# Patient Record
Sex: Female | Born: 1960
Health system: Southern US, Community
[De-identification: ages and names within clinical notes are randomized; demographics above are authoritative.]

## PROBLEM LIST (undated history)

## (undated) DIAGNOSIS — D649 Anemia, unspecified: Secondary | ICD-10-CM

## (undated) DIAGNOSIS — M1990 Unspecified osteoarthritis, unspecified site: Secondary | ICD-10-CM

## (undated) DIAGNOSIS — M2550 Pain in unspecified joint: Secondary | ICD-10-CM

## (undated) DIAGNOSIS — Z5189 Encounter for other specified aftercare: Secondary | ICD-10-CM

## (undated) DIAGNOSIS — E785 Hyperlipidemia, unspecified: Secondary | ICD-10-CM

## (undated) DIAGNOSIS — F32A Depression, unspecified: Secondary | ICD-10-CM

## (undated) DIAGNOSIS — K317 Polyp of stomach and duodenum: Secondary | ICD-10-CM

## (undated) DIAGNOSIS — K219 Gastro-esophageal reflux disease without esophagitis: Secondary | ICD-10-CM

## (undated) DIAGNOSIS — N289 Disorder of kidney and ureter, unspecified: Secondary | ICD-10-CM

## (undated) DIAGNOSIS — Z780 Asymptomatic menopausal state: Secondary | ICD-10-CM

## (undated) DIAGNOSIS — F329 Major depressive disorder, single episode, unspecified: Secondary | ICD-10-CM

## (undated) DIAGNOSIS — M8580 Other specified disorders of bone density and structure, unspecified site: Secondary | ICD-10-CM

## (undated) HISTORY — DX: Encounter for other specified aftercare: Z51.89

## (undated) HISTORY — DX: Other specified disorders of bone density and structure, unspecified site: M85.80

## (undated) HISTORY — DX: Anemia, unspecified: D64.9

## (undated) HISTORY — DX: Major depressive disorder, single episode, unspecified: F32.9

## (undated) HISTORY — DX: Polyp of stomach and duodenum: K31.7

## (undated) HISTORY — DX: Gastro-esophageal reflux disease without esophagitis: K21.9

## (undated) HISTORY — DX: Hyperlipidemia, unspecified: E78.5

## (undated) HISTORY — DX: Disorder of kidney and ureter, unspecified: N28.9

## (undated) HISTORY — DX: Asymptomatic menopausal state: Z78.0

## (undated) HISTORY — DX: Pain in unspecified joint: M25.50

## (undated) HISTORY — PX: TUBAL LIGATION: SHX77

## (undated) HISTORY — DX: Depression, unspecified: F32.A

## (undated) HISTORY — DX: Unspecified osteoarthritis, unspecified site: M19.90

---

## 1981-07-04 HISTORY — PX: TUBAL LIGATION: SHX77

## 2003-12-05 ENCOUNTER — Other Ambulatory Visit: Admission: RE | Admit: 2003-12-05 | Discharge: 2003-12-05 | Payer: Self-pay | Admitting: Obstetrics and Gynecology

## 2004-03-02 ENCOUNTER — Ambulatory Visit (HOSPITAL_COMMUNITY): Admission: RE | Admit: 2004-03-02 | Discharge: 2004-03-02 | Payer: Self-pay | Admitting: Obstetrics and Gynecology

## 2004-03-02 ENCOUNTER — Encounter (INDEPENDENT_AMBULATORY_CARE_PROVIDER_SITE_OTHER): Payer: Self-pay | Admitting: Specialist

## 2005-04-19 ENCOUNTER — Other Ambulatory Visit: Admission: RE | Admit: 2005-04-19 | Discharge: 2005-04-19 | Payer: Self-pay | Admitting: Obstetrics and Gynecology

## 2005-07-04 HISTORY — PX: ENDOMETRIAL ABLATION: SHX621

## 2005-10-14 ENCOUNTER — Ambulatory Visit: Payer: Self-pay | Admitting: Family Medicine

## 2005-10-17 ENCOUNTER — Ambulatory Visit: Payer: Self-pay | Admitting: Gastroenterology

## 2007-08-13 ENCOUNTER — Ambulatory Visit: Payer: Self-pay | Admitting: Gastroenterology

## 2007-08-13 LAB — CONVERTED CEMR LAB
AST: 51 units/L — ABNORMAL HIGH (ref 0–37)
Albumin: 4.5 g/dL (ref 3.5–5.2)
Alkaline Phosphatase: 75 units/L (ref 39–117)
BUN: 17 mg/dL (ref 6–23)
Basophils Relative: 0.5 % (ref 0.0–1.0)
CO2: 31 meq/L (ref 19–32)
Calcium: 9.8 mg/dL (ref 8.4–10.5)
Chloride: 103 meq/L (ref 96–112)
Hemoglobin: 12.2 g/dL (ref 12.0–15.0)
Monocytes Absolute: 0.5 10*3/uL (ref 0.2–0.7)
Platelets: 245 10*3/uL (ref 150–400)
Potassium: 5.5 meq/L — ABNORMAL HIGH (ref 3.5–5.1)
RBC: 3.92 M/uL (ref 3.87–5.11)
Sodium: 143 meq/L (ref 135–145)
Total Bilirubin: 0.8 mg/dL (ref 0.3–1.2)
WBC: 7.5 10*3/uL (ref 4.5–10.5)

## 2007-08-24 ENCOUNTER — Encounter: Payer: Self-pay | Admitting: Gastroenterology

## 2007-08-24 ENCOUNTER — Ambulatory Visit: Payer: Self-pay | Admitting: Gastroenterology

## 2007-08-24 DIAGNOSIS — D131 Benign neoplasm of stomach: Secondary | ICD-10-CM | POA: Insufficient documentation

## 2007-08-24 LAB — CONVERTED CEMR LAB
Bilirubin, Direct: 0.1 mg/dL (ref 0.0–0.3)
Total Bilirubin: 1.1 mg/dL (ref 0.3–1.2)
Total Protein: 6.7 g/dL (ref 6.0–8.3)

## 2007-10-01 ENCOUNTER — Ambulatory Visit: Payer: Self-pay | Admitting: Gastroenterology

## 2007-11-10 DIAGNOSIS — F329 Major depressive disorder, single episode, unspecified: Secondary | ICD-10-CM

## 2007-11-10 DIAGNOSIS — K649 Unspecified hemorrhoids: Secondary | ICD-10-CM | POA: Insufficient documentation

## 2007-11-10 DIAGNOSIS — F32A Depression, unspecified: Secondary | ICD-10-CM | POA: Insufficient documentation

## 2008-01-30 ENCOUNTER — Encounter: Admission: RE | Admit: 2008-01-30 | Discharge: 2008-03-25 | Payer: Self-pay | Admitting: Obstetrics and Gynecology

## 2008-07-18 ENCOUNTER — Ambulatory Visit: Payer: Self-pay | Admitting: Oral Surgery

## 2008-11-27 ENCOUNTER — Ambulatory Visit: Payer: Self-pay | Admitting: Family Medicine

## 2008-11-27 DIAGNOSIS — M2550 Pain in unspecified joint: Secondary | ICD-10-CM | POA: Insufficient documentation

## 2008-11-29 ENCOUNTER — Encounter: Payer: Self-pay | Admitting: Family Medicine

## 2008-11-29 LAB — CONVERTED CEMR LAB: Anti Nuclear Antibody(ANA): NEGATIVE

## 2008-12-02 DIAGNOSIS — E785 Hyperlipidemia, unspecified: Secondary | ICD-10-CM | POA: Insufficient documentation

## 2008-12-02 LAB — CONVERTED CEMR LAB
ALT: 15 units/L (ref 0–35)
Albumin: 4.1 g/dL (ref 3.5–5.2)
Alkaline Phosphatase: 64 units/L (ref 39–117)
BUN: 15 mg/dL (ref 6–23)
Chloride: 113 meq/L — ABNORMAL HIGH (ref 96–112)
Cholesterol: 285 mg/dL — ABNORMAL HIGH (ref 0–200)
GFR calc non Af Amer: 94.98 mL/min (ref >60)
HDL: 51.2 mg/dL (ref >39.00)
Potassium: 4.8 meq/L (ref 3.5–5.1)
Rhuematoid fact SerPl-aCnc: 20 intl units/mL (ref 0.0–20.0)
Sodium: 148 meq/L — ABNORMAL HIGH (ref 135–145)
TSH: 2.16 microintl units/mL (ref 0.35–5.50)
VLDL: 21.4 mg/dL (ref 0.0–40.0)

## 2008-12-16 ENCOUNTER — Ambulatory Visit: Payer: Self-pay | Admitting: Family Medicine

## 2008-12-16 ENCOUNTER — Ambulatory Visit: Payer: Self-pay | Admitting: Diagnostic Radiology

## 2008-12-16 ENCOUNTER — Ambulatory Visit (HOSPITAL_BASED_OUTPATIENT_CLINIC_OR_DEPARTMENT_OTHER): Admission: RE | Admit: 2008-12-16 | Discharge: 2008-12-16 | Payer: Self-pay | Admitting: Family Medicine

## 2008-12-16 DIAGNOSIS — E870 Hyperosmolality and hypernatremia: Secondary | ICD-10-CM | POA: Insufficient documentation

## 2008-12-16 DIAGNOSIS — M25519 Pain in unspecified shoulder: Secondary | ICD-10-CM | POA: Insufficient documentation

## 2008-12-16 DIAGNOSIS — M25529 Pain in unspecified elbow: Secondary | ICD-10-CM | POA: Insufficient documentation

## 2008-12-18 LAB — CONVERTED CEMR LAB
CO2: 31 meq/L (ref 19–32)
GFR calc non Af Amer: 94.96 mL/min (ref >60)

## 2009-01-12 ENCOUNTER — Ambulatory Visit: Payer: Self-pay | Admitting: Family Medicine

## 2009-01-12 ENCOUNTER — Telehealth (INDEPENDENT_AMBULATORY_CARE_PROVIDER_SITE_OTHER): Payer: Self-pay | Admitting: *Deleted

## 2009-01-12 DIAGNOSIS — E663 Overweight: Secondary | ICD-10-CM | POA: Insufficient documentation

## 2009-02-03 ENCOUNTER — Ambulatory Visit: Payer: Self-pay | Admitting: Family Medicine

## 2009-02-03 DIAGNOSIS — L255 Unspecified contact dermatitis due to plants, except food: Secondary | ICD-10-CM | POA: Insufficient documentation

## 2009-02-19 ENCOUNTER — Ambulatory Visit: Payer: Self-pay | Admitting: Cardiology

## 2009-03-31 ENCOUNTER — Encounter: Payer: Self-pay | Admitting: Family Medicine

## 2009-03-31 ENCOUNTER — Ambulatory Visit: Payer: Self-pay | Admitting: Internal Medicine

## 2009-03-31 DIAGNOSIS — IMO0002 Reserved for concepts with insufficient information to code with codable children: Secondary | ICD-10-CM | POA: Insufficient documentation

## 2009-03-31 DIAGNOSIS — S139XXA Sprain of joints and ligaments of unspecified parts of neck, initial encounter: Secondary | ICD-10-CM | POA: Insufficient documentation

## 2009-04-06 ENCOUNTER — Telehealth: Payer: Self-pay | Admitting: Family Medicine

## 2009-04-07 ENCOUNTER — Encounter: Admission: RE | Admit: 2009-04-07 | Discharge: 2009-07-01 | Payer: Self-pay | Admitting: Family Medicine

## 2009-04-10 ENCOUNTER — Encounter: Payer: Self-pay | Admitting: Internal Medicine

## 2009-04-21 ENCOUNTER — Ambulatory Visit: Payer: Self-pay | Admitting: Family Medicine

## 2009-05-11 ENCOUNTER — Encounter: Payer: Self-pay | Admitting: Family Medicine

## 2009-05-13 LAB — CONVERTED CEMR LAB
HDL: 51 mg/dL (ref >39)
LDL Cholesterol: 178 mg/dL — ABNORMAL HIGH (ref 0–99)
Total CHOL/HDL Ratio: 5.1
Triglycerides: 151 mg/dL — ABNORMAL HIGH (ref <150)
VLDL: 30 mg/dL (ref 0–40)

## 2009-05-14 ENCOUNTER — Ambulatory Visit: Payer: Self-pay | Admitting: Internal Medicine

## 2009-05-14 ENCOUNTER — Encounter: Payer: Self-pay | Admitting: Family Medicine

## 2009-05-20 ENCOUNTER — Ambulatory Visit: Payer: Self-pay | Admitting: Family Medicine

## 2009-05-20 DIAGNOSIS — R002 Palpitations: Secondary | ICD-10-CM | POA: Insufficient documentation

## 2009-06-05 ENCOUNTER — Encounter: Payer: Self-pay | Admitting: Family Medicine

## 2009-07-01 ENCOUNTER — Encounter: Admission: RE | Admit: 2009-07-01 | Discharge: 2009-09-29 | Payer: Self-pay | Admitting: Family Medicine

## 2009-07-21 ENCOUNTER — Ambulatory Visit: Payer: Self-pay | Admitting: Family Medicine

## 2009-07-21 DIAGNOSIS — IMO0001 Reserved for inherently not codable concepts without codable children: Secondary | ICD-10-CM | POA: Insufficient documentation

## 2009-07-31 ENCOUNTER — Telehealth: Payer: Self-pay | Admitting: Family Medicine

## 2009-09-01 ENCOUNTER — Ambulatory Visit: Payer: Self-pay | Admitting: Family Medicine

## 2009-09-02 LAB — CONVERTED CEMR LAB
Cholesterol: 290 mg/dL — ABNORMAL HIGH (ref 0–200)
Total CHOL/HDL Ratio: 5.1

## 2009-09-03 ENCOUNTER — Ambulatory Visit: Payer: Self-pay | Admitting: Cardiology

## 2009-09-07 ENCOUNTER — Encounter: Admission: RE | Admit: 2009-09-07 | Discharge: 2009-10-12 | Payer: Self-pay | Admitting: Family Medicine

## 2009-10-12 ENCOUNTER — Encounter: Payer: Self-pay | Admitting: Family Medicine

## 2009-10-21 ENCOUNTER — Ambulatory Visit: Payer: Self-pay | Admitting: Family Medicine

## 2009-10-22 LAB — CONVERTED CEMR LAB
Alkaline Phosphatase: 69 units/L (ref 39–117)
Bilirubin, Direct: 0.1 mg/dL (ref 0.0–0.3)
Cholesterol: 239 mg/dL — ABNORMAL HIGH (ref 0–200)
Indirect Bilirubin: 0.6 mg/dL (ref 0.0–0.9)
Total Bilirubin: 0.7 mg/dL (ref 0.3–1.2)
Total CHOL/HDL Ratio: 4.8
Total Protein: 6.8 g/dL (ref 6.0–8.3)
VLDL: 36 mg/dL (ref 0–40)

## 2009-10-29 ENCOUNTER — Encounter: Admission: RE | Admit: 2009-10-29 | Discharge: 2009-10-29 | Payer: Self-pay | Admitting: Sports Medicine

## 2009-10-29 ENCOUNTER — Ambulatory Visit: Payer: Self-pay | Admitting: Cardiology

## 2009-11-04 ENCOUNTER — Encounter: Admission: RE | Admit: 2009-11-04 | Discharge: 2009-11-04 | Payer: Self-pay | Admitting: Sports Medicine

## 2009-11-05 ENCOUNTER — Encounter: Payer: Self-pay | Admitting: Family Medicine

## 2009-12-28 ENCOUNTER — Ambulatory Visit: Payer: Self-pay | Admitting: Family Medicine

## 2010-03-02 ENCOUNTER — Ambulatory Visit: Payer: Self-pay | Admitting: Family Medicine

## 2010-03-02 DIAGNOSIS — R5381 Other malaise: Secondary | ICD-10-CM | POA: Insufficient documentation

## 2010-03-02 DIAGNOSIS — R5383 Other fatigue: Secondary | ICD-10-CM

## 2010-03-03 ENCOUNTER — Telehealth: Payer: Self-pay | Admitting: Family Medicine

## 2010-03-03 ENCOUNTER — Encounter: Payer: Self-pay | Admitting: Family Medicine

## 2010-03-03 LAB — CONVERTED CEMR LAB: Vitamin B-12: 466 pg/mL (ref 211–911)

## 2010-03-04 ENCOUNTER — Telehealth: Payer: Self-pay | Admitting: Family Medicine

## 2010-03-04 LAB — CONVERTED CEMR LAB
AST: 18 units/L (ref 0–37)
Anti Nuclear Antibody(ANA): NEGATIVE
BUN: 17 mg/dL (ref 6–23)
Calcium: 9.3 mg/dL (ref 8.4–10.5)
Chloride: 106 meq/L (ref 96–112)
Glucose, Bld: 91 mg/dL (ref 70–99)
Hemoglobin: 11.6 g/dL — ABNORMAL LOW (ref 12.0–15.0)
MCHC: 32 g/dL (ref 30.0–36.0)
Platelets: 264 10*3/uL (ref 150–400)
Potassium: 4.6 meq/L (ref 3.5–5.3)
RBC: 3.82 M/uL — ABNORMAL LOW (ref 3.87–5.11)
RDW: 13.5 % (ref 11.5–15.5)
TSH: 2.49 microintl units/mL (ref 0.350–4.500)
Total Bilirubin: 0.4 mg/dL (ref 0.3–1.2)

## 2010-04-07 ENCOUNTER — Telehealth: Payer: Self-pay | Admitting: Family Medicine

## 2010-04-07 DIAGNOSIS — D649 Anemia, unspecified: Secondary | ICD-10-CM | POA: Insufficient documentation

## 2010-04-13 ENCOUNTER — Telehealth (INDEPENDENT_AMBULATORY_CARE_PROVIDER_SITE_OTHER): Payer: Self-pay | Admitting: *Deleted

## 2010-04-15 ENCOUNTER — Encounter: Payer: Self-pay | Admitting: Cardiology

## 2010-04-15 ENCOUNTER — Ambulatory Visit: Payer: Self-pay | Admitting: Internal Medicine

## 2010-04-15 LAB — CONVERTED CEMR LAB
Cholesterol: 222 mg/dL — ABNORMAL HIGH (ref 0–200)
HDL: 47 mg/dL (ref >39)
Hep A IgM: NEGATIVE
Hep B C IgM: NEGATIVE
Hepatitis B Surface Ag: NEGATIVE
Saturation Ratios: 27 % (ref 20–55)
TIBC: 314 ug/dL (ref 250–470)
Total CHOL/HDL Ratio: 4.6
Total CHOL/HDL Ratio: 4.5
Triglycerides: 177 mg/dL — ABNORMAL HIGH (ref <150)
VLDL: 35 mg/dL (ref 0–40)

## 2010-05-03 ENCOUNTER — Telehealth: Payer: Self-pay | Admitting: Family Medicine

## 2010-05-04 ENCOUNTER — Encounter: Payer: Self-pay | Admitting: Family Medicine

## 2010-05-10 ENCOUNTER — Telehealth: Payer: Self-pay | Admitting: Family Medicine

## 2010-05-31 ENCOUNTER — Telehealth: Payer: Self-pay | Admitting: Family Medicine

## 2010-07-19 ENCOUNTER — Telehealth (INDEPENDENT_AMBULATORY_CARE_PROVIDER_SITE_OTHER): Payer: Self-pay | Admitting: *Deleted

## 2010-07-23 ENCOUNTER — Ambulatory Visit
Admission: RE | Admit: 2010-07-23 | Discharge: 2010-07-23 | Payer: Self-pay | Source: Home / Self Care | Attending: Family Medicine | Admitting: Family Medicine

## 2010-07-23 DIAGNOSIS — R635 Abnormal weight gain: Secondary | ICD-10-CM | POA: Insufficient documentation

## 2010-07-25 ENCOUNTER — Encounter: Payer: Self-pay | Admitting: Sports Medicine

## 2010-08-03 NOTE — Progress Notes (Signed)
Summary: Alprazolam refill  Phone Note Refill Request   Refills Requested: Medication #1:  ALPRAZOLAM 0.5 MG TABS 1 tab by mouth at bedtime as needed sleep and anxiety Initial call taken by: Payton Spark CMA,  March 04, 2010 1:38 PM    Prescriptions: ALPRAZOLAM 0.5 MG TABS (ALPRAZOLAM) 1 tab by mouth at bedtime as needed sleep and anxiety  #30 x 0   Entered and Authorized by:   Seymour Bars DO   Signed by:   Seymour Bars DO on 03/04/2010   Method used:   Telephoned to ...       [facility]       [address]       [place], [place]  [address]       Ph: [phone]       Fax: [phone]   RxID:   [id number]   Appended Document: Alprazolam refill Rx called in

## 2010-08-03 NOTE — Miscellaneous (Signed)
  Clinical Lists Changes  Medications: Added new medication of GLUCOSAMINE 1500 COMPLEX  CAPS (GLUCOSAMINE-CHONDROIT-VIT C-MN) Take one tablet by mouth once a day
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
## 2010-08-03 NOTE — Progress Notes (Signed)
Summary: Meds  Phone Note Call from Patient Call back at Work Phone [phone]   Caller: Patient Call For: Seymour Bars DO Summary of Call: Pt calls and states having a hard time getting the flexible spending acct to pay for her Aleve twice a day and wanted to know if you could send in the Nap[facility]yn to [facility] for a 90 day supply and she can get it this way Initial call taken by: Kathlene November LPN,  May 31, 2010 1:12 PM  Follow-up for Phone Call        does she want to do this instead of Advil?   Follow-up by: Seymour Bars DO,  May 31, 2010 1:55 PM  Additional Follow-up for Phone Call Additional follow up Details #1::        Yes please Additional Follow-up by: Kathlene November LPN,  May 31, 2010 2:17 PM    New/Updated Medications: NAPROXEN 375 MG TABS (NAPROXEN) 1 tab by mouth two times a day with food as needed for pain Prescriptions: NAPROXEN 375 MG TABS (NAPROXEN) 1 tab by mouth two times a day with food as needed for pain  #180 x 0   Entered and Authorized by:   Seymour Bars DO   Signed by:   Seymour Bars DO on 05/31/2010   Method used:   [facility]ron[facility] to        [facility]       [address]       [place], [place]  [address]       Ph: [phone]       Fax: [phone]   RxID:   [id number]
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
## 2010-08-03 NOTE — Progress Notes (Signed)
Summary: Additional Sxs  Phone Note Call from Patient   Caller: Patient Summary of Call: Pt states she forgot to tell you about these Sxs yesterday- having tingling in both legs and R elbow. She also states she has cold sweats since starting cymbalta. Also c/o speech delay "brain not communicating w/ mouth". Initial call taken by: Payton Spark CMA,  March 03, 2010 10:22 AM  Follow-up for Phone Call        I'd like to keep her on cymbalta as a treatment for probable fibromyalgia.   Follow-up by: Seymour Bars DO,  March 03, 2010 10:30 AM     Appended Document: Additional Sxs Pt aware of the above

## 2010-08-03 NOTE — Progress Notes (Signed)
Summary: referral and recommendation of massage therapist  Phone Note Call from Patient Call back at Work Phone [phone]   Caller: Patient Call For: Seymour Bars DO Summary of Call: Pt would nlike to get a referral and recommendation of a massage therapist. States muscles feel really tight and thinks this might help especially with the fibromyalgia Initial call taken by: Kathlene November LPN,  May 03, 2010 9:07 AM  Follow-up for Phone Call        I know of one in [place], but if she wants someone closer, she can call around - [facility], for example.  Her insurance will not cover this, so does not need a referral. Follow-up by: Seymour Bars DO,  May 03, 2010 9:15 AM  Additional Follow-up for Phone Call Additional follow up Details #1::        Pt notified of above information Additional Follow-up by: Kathlene November LPN,  May 03, 2010 9:46 AM
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
## 2010-08-03 NOTE — Progress Notes (Signed)
Summary: need orders to have Lipid drawn at [facility] office   Phone Note Call from Patient Call back at [phone]   Caller: Patient Summary of Call: Pt calling to get orders fror lab work( Lipid) Initial call taken by: Judie Grieve,  April 13, 2010 1:36 PM  Follow-up for Phone Call        Ordered Lipid and liver in EMR and orders faxed to [facility].  Follow-up by: Bethena Midget, RN, BSN,  April 13, 2010 3:33 PM

## 2010-08-03 NOTE — Miscellaneous (Signed)
Summary: PT Renewal/[facility]  PT Renewal/[facility]   Imported By: Lanelle Bal 08/04/2009 13:17:37  _____________________________________________________________________  External Attachment:    Type:   Image     Comment:   External Document

## 2010-08-03 NOTE — Progress Notes (Signed)
Summary: Xanax refill  Phone Note Refill Request Message from:  Patient on May 10, 2010 12:59 PM  Refills Requested: Medication #1:  ALPRAZOLAM 0.5 MG TABS 1 tab by mouth at bedtime as needed sleep and anxiety   Supply Requested: 1 month let pt know when done [phone]   Method Requested: Fax to Local Pharmacy Initial call taken by: Kathlene November LPN,  May 10, 2010 1:00 PM    Prescriptions: ALPRAZOLAM 0.5 MG TABS (ALPRAZOLAM) 1 tab by mouth at bedtime as needed sleep and anxiety  #30 x 0   Entered and Authorized by:   Seymour Bars DO   Signed by:   Seymour Bars DO on 05/10/2010   Method used:   Printed then faxed to ...       [facility]       [address]       [place], [place]  [address]       Ph: [phone]       Fax: [phone]   RxID:   [id number]

## 2010-08-03 NOTE — Miscellaneous (Signed)
Summary: PT Discharge/[facility]  PT Discharge/[facility]   Imported By: Lanelle Bal 10/17/2009 11:10:54  _____________________________________________________________________  External Attachment:    Type:   Image     Comment:   External Document

## 2010-08-03 NOTE — Letter (Signed)
Summary: [facility]  [facility]   Imported By: Lanelle Bal 11/19/2009 08:22:58  _____________________________________________________________________  External Attachment:    Type:   Image     Comment:   External Document

## 2010-08-03 NOTE — Progress Notes (Signed)
Summary: Celebrex refills  Phone Note Refill Request   Refills Requested: Medication #1:  Celebrex Initial call taken by: Payton Spark CMA,  July 31, 2009 3:22 PM    New/Updated Medications: CELEBREX 200 MG CAPS (CELECOXIB) 1 capsule by mouth daily with food Prescriptions: CELEBREX 200 MG CAPS (CELECOXIB) 1 capsule by mouth daily with food  #30 x 1   Entered and Authorized by:   Seymour Bars DO   Signed by:   Seymour Bars DO on 07/31/2009   Method used:   [facility]        [facility]       [address]       [place], [place]  [address]       Ph: [phone]       Fax: [phone]   RxID:   [id number]   Appended Document: Celebrex refills LMOM informing Pt
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
## 2010-08-03 NOTE — Progress Notes (Signed)
Summary: Lab order needed  Phone Note Call from Patient Call back at Home Phone [phone]   Caller: Patient Call For: Seymour Bars DO Summary of Call: Pt calls and states needs to have her iron rechecked and cholesterol . Need order. Wil have done in H.P fax order to [phone] Initial call taken by: Kathlene November LPN,  April 07, 2010 11:25 AM  Follow-up for Phone Call        lab order printed but she was also supposed to complete stool cards. Follow-up by: Seymour Bars DO,  April 07, 2010 11:52 AM  New Problems: UNSPECIFIED ANEMIA (ICD-285.9)   New Problems: UNSPECIFIED ANEMIA (ICD-285.9)  Appended Document: Lab order needed Faxed order to pt and attached note as well to complete stool cards and return tm to office. KJ LPN

## 2010-08-03 NOTE — Assessment & Plan Note (Signed)
Summary: rov lipid - [facility]  Medications Added CRESTOR 10 MG TABS (ROSUVASTATIN CALCIUM) Take 1 tablet by mouth every other day. ALEVE 220 MG TABS (NAPROXEN SODIUM) 1 tab by mouth two times a day with food as needed for pain FISH OIL 1000 MG CAPS (OMEGA-3 FATTY ACIDS) Take 1-2 capsules every day.      Allergies Added:   Visit Type:  Follow-up Primary Haley Fuentes:  Seymour Bars DO  CC:  dyslipidemia follow-up.  History of Present Illness:  Lipid Clinic Visit      The patient comes in today for dyslipidemia follow-up.  The patient has no complaints of medication problems, chest pain, muscle aches, and muscle cramps.  She is currently taking Crestor 10mg  every other day.  She still has some pain associated with fibromyalgia but this is better with Cymbalta.  It has limited her exercise significantly.  She has been waiting to start an exercise program until the pain is under better control.    Dietary compliance review reveals pt is trying to limit fats during some meals.  For breakfast, she eats greek yogurt with nuts or cranberries, coffe, and cheerios.  Lunch is often sandwiches, quesedilla, or salads.  Dinner is pasta and she does drink wine on a regular basis.  Her snacks usually consist of popcorn and occassionally some candy.   .  Lipid Management Haley Fuentes  Haley Fuentes, PharmD  Medications Prior to Update: 1)  Prometrium 100 Mg Caps (Progesterone Micronized) .... Take One Tablet By Mouth Once A Day 2)  Estradiol 0.5 Mg Tabs (Estradiol) .... Take One Tablet By Mouth Once A Day 3)  Alprazolam 0.5 Mg Tabs (Alprazolam) .Marland Kitchen.. 1 Tab By Mouth At Bedtime As Needed Sleep and Anxiety 4)  Crestor 10 Mg Tabs (Rosuvastatin Calcium) .... Take 1/2 Tab By Mouth At Bedtime 5)  Cymbalta 60 Mg Cpep (Duloxetine Hcl) .Marland Kitchen.. 1 Capsule By Mouth Daily 6)  Aleve 220 Mg Tabs (Naproxen Sodium) .Marland Kitchen.. 1 Tab By Mouth Two Times A Day With Food  Current Medications (verified): 1)  Prometrium 100 Mg Caps (Progesterone  Micronized) .... Take One Tablet By Mouth Once A Day 2)  Estradiol 0.5 Mg Tabs (Estradiol) .... Take One Tablet By Mouth Once A Day 3)  Alprazolam 0.5 Mg Tabs (Alprazolam) .Marland Kitchen.. 1 Tab By Mouth At Bedtime As Needed Sleep and Anxiety 4)  Crestor 10 Mg Tabs (Rosuvastatin Calcium) .... Take 1 Tablet By Mouth Every Other Day. 5)  Cymbalta 60 Mg Cpep (Duloxetine Hcl) .Marland Kitchen.. 1 Capsule By Mouth Daily 6)  Aleve 220 Mg Tabs (Naproxen Sodium) .Marland Kitchen.. 1 Tab By Mouth Two Times A Day With Food As Needed For Pain 7)  Fish Oil 1000 Mg Caps (Omega-3 Fatty Acids) .... Take 1-2 Capsules Every Day.  Allergies (verified): 1)  ! Vicodin   Vital Signs:  Patient profile:   50 year old female Weight:      161 pounds BP sitting:   116 / 82  Impression & Recommendations:  Problem # 1:  HYPERLIPIDEMIA (ICD-272.4) Cholesterol improved with increase of Crestor to 10mg  every other day.  TC still slightly above goal at 222, TG- 177 (goal<150), HDL- 49 (goal>45), and LDL- 138 (goal<130).  Since she is tolerating Crestor, will continue this for now.  Relucant to increase dose given history of myalgias with statins and her other pain issues.  Will add fish oil to help decrease TG to goal.  Encouraged her to continue to watch her fat and cholesterol intake.  Hopefully she  will be able to begin exercise again in the near future, which will also help further reduce her LDL and TG.  Will f/u in 6 months.   Her updated medication list for this problem includes:    Crestor 10 Mg Tabs (Rosuvastatin calcium) .Marland Kitchen... Take 1 tablet by mouth every other day.  Patient Instructions: 1)  Continue Crestor 10mg  every other day 2)  Start fish oil 1-2 grams daily.  Try to place them in the refrigerator to cut down on the fishy aftertaste 3)  Start exercising as much as possible  4)  Continue low-fat, low-cholesterol diet  5)  Recheck lipid panel in 6 months  6)  Lab Appt: Will check labs in [facility] 7)  Lipid Appt: 10/14/2010 at 4pm

## 2010-08-03 NOTE — Letter (Signed)
Summary: Shelbie Hutching [employer]  Craige Brawley [employer]   Imported By: Lanelle Bal 08/05/2009 08:56:13  _____________________________________________________________________  External Attachment:    Type:   Image     Comment:   External Document
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
## 2010-08-05 NOTE — Progress Notes (Signed)
Summary: Mediation  Phone Note Call from Patient Call back at Work Phone [phone]   Caller: Patient Summary of Call: pt would like to talk to [facility] regarding rx for appetite suppression medication.  Asked to speak specifically with Marcelino Duster. Initial call taken by: Francee Piccolo CMA Duncan Dull),  July 19, 2010 4:03 PM  Follow-up for Phone Call        Pt scheduled apt Follow-up by: Payton Spark CMA,  July 20, 2010 3:05 PM
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
## 2010-08-20 ENCOUNTER — Ambulatory Visit (INDEPENDENT_AMBULATORY_CARE_PROVIDER_SITE_OTHER): Payer: 59

## 2010-08-20 ENCOUNTER — Encounter: Payer: Self-pay | Admitting: Family Medicine

## 2010-08-20 DIAGNOSIS — R635 Abnormal weight gain: Secondary | ICD-10-CM

## 2010-08-25 NOTE — Assessment & Plan Note (Signed)
Summary: 8:30 appt bp/wt/ekg check  Nurse Visit   Vital Signs:  Patient profile:   50 year old female Height:      63 inches Weight:      163 pounds BP sitting:   114 / 80  (right arm) Cuff size:   regular  Vitals Entered By: Avon Gully CMA, (AAMA) (August 20, 2010 9:04 AM)  Impression & Recommendations:  Problem # 1:  WEIGHT GAIN (ICD-783.1) Has lost 4 lbs and BP looks better. OK to refill med. F/U in one month.   Complete Medication List: 1)  Prometrium 100 Mg Caps (Progesterone micronized) .... Take one tablet by mouth once a day 2)  Estradiol 0.5 Mg Tabs (Estradiol) .... Take one tablet by mouth once a day 3)  Alprazolam 0.5 Mg Tabs (Alprazolam) .Marland Kitchen.. 1 tab by mouth at bedtime as needed sleep and anxiety 4)  Crestor 10 Mg Tabs (Rosuvastatin calcium) .... Take 1 tablet by mouth every other day. 5)  Cymbalta 60 Mg Cpep (Duloxetine hcl) .Marland Kitchen.. 1 capsule by mouth daily 6)  Naproxen 375 Mg Tabs (Naproxen) .Marland Kitchen.. 1 tab by mouth two times a day with food as needed for pain 7)  Fish Oil 1000 Mg Caps (Omega-3 fatty acids) .... Take 1-2 capsules every day. 8)  Glucosamine 1500 Complex Caps (Glucosamine-chondroit-vit c-mn) .... Take one tablet by mouth once a day 9)  Flexeril 10 Mg Tabs (Cyclobenzaprine hcl) .... Take 1 tab by mouth three times a day as needed 10)  Phentermine Hcl 37.5 Mg Caps (Phentermine hcl) .Marland Kitchen.. 1 capsule by mouth qam, take 30 min before breakfast   Patient Instructions: 1)  Please schedule a follow-up appointment in 1 month with the physican.    Primary Care Provider:  Seymour Bars DO   History of Present Illness: BP and Wt check for phentermine    Allergies: 1)  ! Vicodin  Orders Added: 1)  Est. Patient Level I [65784] Prescriptions: PHENTERMINE HCL 37.5 MG CAPS (PHENTERMINE HCL) 1 capsule by mouth qAM, take 30 min before breakfast  #30 x 0   Entered and Authorized by:   Nani Gasser MD   Signed by:   Nani Gasser MD on 08/20/2010  Method used:   Print then Give to Patient   RxID:   [id number]

## 2010-09-17 ENCOUNTER — Ambulatory Visit: Payer: Self-pay | Admitting: Family Medicine

## 2010-09-17 ENCOUNTER — Encounter: Payer: Self-pay | Admitting: Family Medicine

## 2010-09-17 ENCOUNTER — Ambulatory Visit (INDEPENDENT_AMBULATORY_CARE_PROVIDER_SITE_OTHER): Payer: 59 | Admitting: Family Medicine

## 2010-09-17 DIAGNOSIS — M25559 Pain in unspecified hip: Secondary | ICD-10-CM | POA: Insufficient documentation

## 2010-09-17 DIAGNOSIS — G4700 Insomnia, unspecified: Secondary | ICD-10-CM

## 2010-09-17 DIAGNOSIS — R635 Abnormal weight gain: Secondary | ICD-10-CM
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
## 2010-10-04 ENCOUNTER — Telehealth: Payer: Self-pay | Admitting: Family Medicine

## 2010-10-04 DIAGNOSIS — F32A Depression, unspecified: Secondary | ICD-10-CM

## 2010-10-04 DIAGNOSIS — F329 Major depressive disorder, single episode, unspecified: Secondary | ICD-10-CM

## 2010-10-04 MED ORDER — DULOXETINE HCL 60 MG PO CPEP: 60.0000 mg | ORAL_CAPSULE | Freq: Every day | ORAL | Status: DC

## 2010-10-04 NOTE — Telephone Encounter (Signed)
Pt can pick up samples of Cymbalta 60 mg here.  I will set out 3 boxes for her to pick up.  Michelle, pls be sure her RX was already sent to [facility].  Thanks.

## 2010-10-04 NOTE — Telephone Encounter (Signed)
Pt sent in a request to [facility] to get a refill of Cymbalta but is already out of med. Can a small refill be sent to [facility]?

## 2010-10-14 ENCOUNTER — Ambulatory Visit: Payer: Self-pay

## 2010-11-10 ENCOUNTER — Other Ambulatory Visit: Payer: Self-pay | Admitting: Family Medicine

## 2010-11-10 ENCOUNTER — Other Ambulatory Visit: Payer: Self-pay | Admitting: Cardiology

## 2010-11-16 NOTE — Assessment & Plan Note (Signed)
[facility]                         GASTROENTEROLOGY OFFICE NOTE   Haley Fuentes, Haley Fuentes                          MRN:          [id number]  DATE:10/01/2007                            DOB:          May 29, 1961    PRIMARY CARE PHYSICIAN:  Carrington Clamp, M.D.   GI PROBLEM LIST:  1. Mild intermittent constipation, probably functional.  Colonoscopy      February 2009 was normal including look in the terminal ileum.  She      had small external hemorrhoids.  CBC, complete metabolic profile,      thyroid testing all normal February 2009.  2. Intermittent pill-associated dysphagia.  EGD February 2009 was      normal except for several small biopsy-proven fundic gland polyps,      nonerosive GERD likely playing a role.   INTERVAL HISTORY:  I last saw Haley Fuentes at the time of her colonoscopy and  upper endoscopy about six weeks ago.  Since then, she has been on  Citrucel fiber supplementation on a daily basis but is still bothered by  scybalous stools and intermittent straining to move her bowels.  She has  had no overt GI bleeding.  Her pill-associated dysphagia is completely  resolved.  I did not mention above, but caffeine was probably playing a  role in GERD symptoms.  She has cut back from five caffeinated beverages  a day down to one caffeinated beverage a day.  Currently, she is taking  two Protonix a day.   CURRENT MEDICINES:  1. Protonix twice daily.  2. Fluoxetine 40 mg once daily.  3. Flexeril.   PHYSICAL EXAMINATION:  Weight 156 pounds which is up 7 pounds since her  last visit.  Blood pressure 104/64, pulse 60.  CONSTITUTIONAL:  Generally, well-appearing.  ABDOMEN:  Soft, nontender, nondistended, normal bowel sounds.   ASSESSMENT/PLAN:  A 50 year old woman with mild intermittent  constipation, likely GERD-related dysphagia.  I recommended she increase  her fiber supplements to one heaping spoonful a day of the Citrucel.  I  suspect that her  caffeine intake was playing a significant role.  She  has cut back from five caffeinated beverages a day down to just one, and  so I think she will probably be okay on just once daily proton pump  inhibitor.  She will try over-the-counter Prilosec for now to see if  that helps.  She will return to see me in six to eight weeks and sooner  if needed.     Rachael Fee, MD  Electronically Signed   DPJ/MedQ  DD: 10/01/2007  DT: 10/01/2007  Job #: [id number]   cc:   Carrington Clamp, M.D.

## 2010-11-16 NOTE — Assessment & Plan Note (Signed)
[facility]                         GASTROENTEROLOGY OFFICE NOTE   Haley Fuentes                          MRN:          [id number]  DATE:08/13/2007                            DOB:          25-Sep-1960    REASON FOR REFERRAL:  Dr. Cindra Presume asked me to evaluate Haley Fuentes in  consultation regarding intermittent dysphagia, intermittent odynophagia,  intermittent bight red blood per rectum and chronic constipation.   HISTORY OF PRESENT ILLNESS:  Haley Fuentes is a very pleasant 50 year old  woman who has had several months of mild intermittent dysphagia.  She  has had two episodes with pills, or at least she believes the pills  brought on these symptoms.  She would take a fluoxetine in the evening  just before bed and then woke up with pain in her lower esophagus area  and trouble swallowing for one to two days afterward with really  odynophagia,  but not classic dysphagia.  She says this has happened  twice.  She did try to modify the way she was taking the pill, so she  was taking it two hours before she would go to bed, and the second  occurrence happened after this lifestyle modification.  She also has  very mild, intermittent solid food dysphagia.  Nothing has ever stuck,  but it does feel like, as she describes it,  her esophagus getting  somewhat backed up.  She does get hiccups and belching after she eats  oftentimes.  She has had no vomiting and no overt upper GI bleeding.  She has not tried anti-acid medicine and she has never really had frank  pyrosis or acid regurgitation.   She also has rather chronic constipation.  She describes scybalous  stools and having to strain to move her bowels.  She said when she has  particularly hard bowel movements, she will often see blood on the  tissue paper.  Most recently this was over the holidays and she had  quite a lot of bleeding.   REVIEW OF SYSTEMS:  Notable for stable weight and is otherwise  essentially normal, and is available on her nursing intake sheet.   PAST MEDICAL HISTORY:  1. Arthritis.  2. Depression.  3. Status post tubal ligation.   CURRENT MEDICATIONS:  Fluoxetine.   ALLERGIES:  VICODIN.   SOCIAL HISTORY:  Married with two children.  Works in [employer] [place]  Nonsmoker. Drinks very infrequently.  Drinks 4 to 5  caffeinated beverages in the morning every morning.  Does not eat much  peppermint.   FAMILY HISTORY:  Mother with breast cancer.  No colon cancer or colon  polyps in family.   PHYSICAL EXAMINATION:  5 feet 3 inches.  149 pounds.  Blood pressure  98/70, pulse 66.  CONSTITUTIONAL:  Generally well appearing.  NEUROLOGIC:  Alert and oriented x3.  EYES:  Extraocular movements intact.  MOUTH:  Oropharynx moist.  No lesions.  NECK:  Supple.  No lymphadenopathy.  CARDIOVASCULAR:  Heart regular rate and rhythm.  LUNGS:  Clear to auscultation bilaterally.  ABDOMEN:  Soft, nontender, nondistended.  Normal bowel sounds.  EXTREMITIES:  No lower extremity edema.  SKIN:  No rashes or lesions on visible extremities.   ASSESSMENT/PLAN:  50 year old woman with  intermittent pill  associated dysphagia, constipation, rectal bleeding.   First, her constipation and mild rectal bleeding, sound like they are  hemorrhoidal in nature.  It has been a chronic problem for her.  I think  it is unlikely that she has anything worrisome such as neoplasm as a  cause, but to be safe I will arrange for her to have a full colonoscopy  done at her soonest convenience.  I have also recommended she get on  fiber supplements with Citrucel taken on a once daily basis to see if  that helps even out her bowel habits.  She will also get a basic set of  labs including thyroid testing, CBC and a complete metabolic profile as  well.   For her dysphagia, she may have acid refluxing without causing pyrosis.  Certainly she drinks quite a lot of caffeine, four to five  cups a day  and this could contribute to acid reflux.  I have given her a GERD  handout and recommended that she try to cut back on her caffeine intake.  I am also starting her on a trial of Protonix once daily 20 to 30  minutes prior to her breakfast meal on a daily basis.  Lastly, at the  same time of her colonoscopy,  I will arrange for her to have an  endoscopy to see if she has any stricturing disease.     Haley Fee, MD  Electronically Signed    DPJ/MedQ  DD: 08/13/2007  DT: 08/13/2007  Job #: [id number]   cc:   Haley Fuentes, M.D.

## 2010-11-19 NOTE — Op Note (Signed)
NAME:  [name], [name]                             ACCOUNT NO.:  [id number]   MEDICAL RECORD NO.:  [id number]                   PATIENT TYPE:  AMB   LOCATION:  SDC                                  FACILITY:  [facility]   PHYSICIAN:  Carrington Clamp, M.D.              DATE OF BIRTH:  Jul 10, 1960   DATE OF PROCEDURE:  03/02/2004  DATE OF DISCHARGE:                                 OPERATIVE REPORT   POSTOPERATIVE DIAGNOSES:  1.  Menorrhagia.  2.  Dysmenorrhea.   POSTOPERATIVE DIAGNOSES:  1.  Menorrhagia.  2.  Dysmenorrhea.   PROCEDURE:  Dilation and curettage, hysteroscopy, Novasure ablation,  endometrial ablation.   ATTENDING:  Carrington Clamp, M.D.   Present for the case is Fredderick Erb, representative for Novasure.   ANESTHESIA:  General anesthesia.   ESTIMATED BLOOD LOSS:  50 mL.   INTRAVENOUS FLUIDS:  __________.   URINE OUTPUT:  Not measured.   COMPLICATIONS:  Vacuum alarm within the first 10 seconds of the start of the  procedure.  This was reset by tapping the valve and ran after the third tap  on the machine to start it.   FINDINGS:  About a nine-week size uterus that sounded to 9 cm with a shaggy  endometrium but no discrete polyps.  The cervix was 3 cm.  The width of the  uterus was about 4.8 cm.   MEDICATIONS:  1% plain lidocaine to the cervix.   Counts were correct x3.   PATHOLOGY:  Endometrial curettings.   TECHNIQUE:  After adequate general anesthesia was achieved, the patient was  prepped and draped in the usual sterile fashion in the dorsal lithotomy  position.  The bladder had been emptied with a red rubber catheter and a  speculum was placed in the vagina.  A single-tooth tenaculum was used to  grasp the cervix and the cervix was dilated with Shawnie Pons dilators.  A  hysteroscope was passed into the uterine cavity and the above findings  noted.  Dilation and curettage was then performed with the endometrial  curettings going to pathology.  The above counts were  correct x3.  The scope  was passed again and the endometrium had been removed down almost to the  basalis layer.  There were no fibroids or any other abnormalities.  There  was a small amount of bicornuate arcing at the top of the uterus.   The cervix and the uterus had been measured to indicate a cavity length of  approximately 6 cm.  The Novasure instrument was introduced and sat into the  cavity to indicate a cavity width of 4.8.  The power was at 158 and the time  for the procedure is 1 minute 1 second.  There was no complication after the  vacuum alarm pad had been reset.  The Novasure instrument was then removed  from the cavity and the cavity was looked at again  with the hysteroscope and  found to have had good contact with the endometrial array.  The patient  tolerated the procedure well.  All instruments were withdrawn from the  cavity, and the patient tolerated the procedure well, was returned to the  recovery room in stable condition.                                               Carrington Clamp, M.D.    MH/MEDQ  D:  03/02/2004  T:  03/02/2004  Job:  [id number]

## 2010-12-09 ENCOUNTER — Other Ambulatory Visit: Payer: Self-pay | Admitting: Family Medicine

## 2011-01-03 ENCOUNTER — Ambulatory Visit: Payer: 59 | Admitting: Family Medicine

## 2011-01-28 ENCOUNTER — Encounter: Payer: Self-pay | Admitting: Family Medicine

## 2011-01-28 ENCOUNTER — Ambulatory Visit (INDEPENDENT_AMBULATORY_CARE_PROVIDER_SITE_OTHER): Payer: 59 | Admitting: Family Medicine

## 2011-01-28 DIAGNOSIS — M25559 Pain in unspecified hip: Secondary | ICD-10-CM

## 2011-01-28 DIAGNOSIS — R635 Abnormal weight gain: Secondary | ICD-10-CM

## 2011-01-28 MED ORDER — PHENTERMINE HCL 37.5 MG PO CAPS: 37.5000 mg | ORAL_CAPSULE | ORAL | Status: AC

## 2011-01-28 MED ORDER — ZOLPIDEM TARTRATE 10 MG PO TABS: ORAL_TABLET | ORAL | Status: DC

## 2011-01-28 NOTE — Progress Notes (Signed)
  Subjective:    Patient ID: Haley Fuentes, female    DOB: 1960/10/14, 50 y.o.   MRN: [id number]  HPI  50 yo WF presents for f/u chronic L hip pain.  It has slightly improved after 2 injection with Dr Ethelene Hal. She is prepared to try Rolfing to work on her connective tissue.  Her weight is unchanged.  She did a total of 2 months of Phentermine earlier this year.  She would like to try for another month as she is stuck at current weight and has complication of dyslipidemia.  She is trying to stay active but has more L hip discomfort with taking a wide stride and going up and down stairs.  She take Aleve 1-2 x a day everyday for pain.  She has some pains at rest and some with walking.    BP 108/76  Pulse 74  Resp 20  Ht 5\' 3"  (1.6 m)  Wt 163 lb (73.936 kg)  BMI 28.87 kg/m2  SpO2 95%  LMP 07/04/2004   Review of Systems  Constitutional: Negative for unexpected weight change.  Respiratory: Negative for shortness of breath.   Cardiovascular: Negative for chest pain and palpitations.  Musculoskeletal: Positive for arthralgias and gait problem. Negative for back pain.  Psychiatric/Behavioral: Negative for sleep disturbance and dysphoric mood. The patient is not nervous/anxious.        Objective:   Physical Exam  Constitutional: She appears well-developed.  HENT:  Mouth/Throat: Oropharynx is clear and moist.  Neck: No thyromegaly present.  Cardiovascular: Normal rate, regular rhythm and normal heart sounds.   Pulmonary/Chest: Effort normal and breath sounds normal.  Musculoskeletal: She exhibits no edema.  Skin: Skin is warm and dry.  Psychiatric: She has a normal mood and affect.          Assessment & Plan:

## 2011-01-28 NOTE — Assessment & Plan Note (Signed)
BMI 28, unchanged with hx of hyperlipidemia.  Did well on Phentermine earlier this year and is ready to really work on diet and is doing some exercise, limited by hip pain.  Will restart for 1 month but if BMI is 27 or less, will stop.

## 2011-01-28 NOTE — Assessment & Plan Note (Signed)
Chronic.  S/p 2 injections with Dr Ethelene Hal with limited help.  She is ready to start Rolfing technique to see if this helps.  Using Aleve only for pain.

## 2011-01-28 NOTE — Patient Instructions (Signed)
Retry Phentermine along with healthy diet (low sugar/ low carb/ plenty of lean proteins and veggies) and work on regular exercise.  Call me if any problems. Best of luck with Left Hip.  Use Nasonex sample 2 sprays per nostril daily x 2 wks.  Call if sinus pressure does not improve after 10 days.  Return for CPE/ fasting labs in Sept.

## 2011-02-18 ENCOUNTER — Other Ambulatory Visit: Payer: Self-pay | Admitting: Family Medicine

## 2011-02-18 NOTE — Telephone Encounter (Signed)
Pt called for script for ambien.  Told the pt that script cannot be considered until 02-28-11 since last refill was on 12-29-10 in which she was last seen in the office.   Plan:  Pt had not picked up the script from 01-28-11 which was sent to [facility] on [address] and pt will check with the pharmacy before she calls back on Monday 02-28-11 to see if they have the script. Jarvis Newcomer, LPN [facility]

## 2011-02-21 ENCOUNTER — Ambulatory Visit (INDEPENDENT_AMBULATORY_CARE_PROVIDER_SITE_OTHER): Payer: 59 | Admitting: Family Medicine

## 2011-02-21 ENCOUNTER — Encounter: Payer: Self-pay | Admitting: Family Medicine

## 2011-02-21 VITALS — BP 118/79 | HR 100 | Temp 98.0°F | Wt 160.0 lb

## 2011-02-21 DIAGNOSIS — J019 Acute sinusitis, unspecified: Secondary | ICD-10-CM

## 2011-02-21 MED ORDER — HYDROCODONE-HOMATROPINE 5-1.5 MG/5ML PO SYRP: 5.0000 mL | ORAL_SOLUTION | Freq: Every evening | ORAL | Status: AC | PRN

## 2011-02-21 MED ORDER — CEFDINIR 300 MG PO CAPS: 600.0000 mg | ORAL_CAPSULE | Freq: Every day | ORAL | Status: DC

## 2011-02-21 NOTE — Progress Notes (Signed)
  Subjective:    Patient ID: Haley Fuentes, female    DOB: [birth year], 50 y.o.   MRN: [id number]  HPI  50 yo WF presents for increased sinus pressure x 3 days with a dry cough and chest tightness following about 3-4 wks of rhinorrhea and postnasal drip.  She feels tired and achey and her head in in a fog.  She has subjective fevers and chills over the weekend.  She had diarrhea from Wed thru Sat last wk.  She is taking Benadryl and Aleve.  She is not on any decongestants.  She is having HAs.  She has had sinus infections before but it has been a long time.    BP 118/79  Pulse 100  Temp(Src) 98 F (36.7 C) (Oral)  Wt 160 lb (72.576 kg)  SpO2 98%  LMP 07/04/2004   Review of Systems  Constitutional: Positive for fever and fatigue.  HENT: Positive for ear pain, congestion, sore throat, rhinorrhea, postnasal drip and sinus pressure.   Respiratory: Positive for cough and chest tightness. Negative for shortness of breath and wheezing.   Cardiovascular: Negative for chest pain.  Gastrointestinal: Positive for diarrhea. Negative for nausea and abdominal pain.  Skin: Negative for rash.  Neurological: Positive for headaches.       Objective:   Physical Exam  Constitutional: She appears well-developed and well-nourished.  HENT:  Head: Normocephalic and atraumatic.  Right Ear: External ear normal.  Left Ear: External ear normal.  Nose: Nose normal.  Mouth/Throat: Oropharynx is clear and moist.       Maxillary sinus TTP  Eyes: Conjunctivae are normal.  Cardiovascular: Normal rate, regular rhythm and normal heart sounds.   No murmur heard. Pulmonary/Chest: Effort normal and breath sounds normal.  Lymphadenopathy:    She has cervical adenopathy.  Skin: Skin is warm and dry. No rash noted.  Psychiatric: She has a normal mood and affect.          Assessment & Plan:  ABS secondary to underlying allergic postnasal drip/ rhinorrhea for the preceeding 3-4 wks.  Will treat with Omnicef 600  mg once daily x 10 days.  Use Claritin D for rhinorrhea and congestion, aleve for aches and pain and delsym or hycodan for cough.  Rest, drink plenty of fluids.  Call if not improving after 7 days.

## 2011-02-21 NOTE — Patient Instructions (Signed)
Take Omnicef 2 capsules once daily with dinner x 10 days for sinusitis.  Take OTC Claritin -D 24 hr each morning (ask the pharmacist for this), Aleve as needed.  Use Hycodan at night for cough and Delsym during the day for cough.  Call if not improving after 7 days.

## 2011-02-23 ENCOUNTER — Telehealth: Payer: Self-pay | Admitting: Family Medicine

## 2011-02-23 MED ORDER — ONDANSETRON 8 MG PO TBDP: 8.0000 mg | ORAL_TABLET | Freq: Three times a day (TID) | ORAL | Status: DC | PRN

## 2011-02-23 MED ORDER — AMOXICILLIN 500 MG PO CAPS: 500.0000 mg | ORAL_CAPSULE | Freq: Three times a day (TID) | ORAL | Status: DC

## 2011-02-23 NOTE — Telephone Encounter (Signed)
Stop omnicef.  Likely caused N/V. Will change her to Amoxicillin 500 mg 3 x a day for 7 days.  Call if any problems.

## 2011-02-23 NOTE — Telephone Encounter (Signed)
Pt notified that a amoxil script had been called.  Told the pt to stop the omnicef that it is probable cause of nausea and vomiting.  Told the pt that I will send a script of zofran ODT 8 mg # 10/0 refills to her pharm. Jarvis Newcomer, LPN [facility]

## 2011-02-23 NOTE — Telephone Encounter (Signed)
Pt called in to speak with the triage nurse because she started having vomiting yesterday PM in addition to the other symptoms she was seen for on 02-21-11.  This am woke up feeling like her stomach is hot, and things are going to come up in her throat.  Feels nauseted.  HAs just ate dry toast to see if that will stay down.  Has not had her antibiotic since yesterday morning due to the nausea.  Allergic to vicodin. Plan:  Routed call to Dr. Cathey Endow.  Told pt to stay on liquids to avoid dehydration, make sure urinating okay, and will advise Dr. Cathey Endow for further instructions.  If pt eats she was told to stick to bland foods (Brat). Jarvis Newcomer, LPN [facility]

## 2011-03-16 ENCOUNTER — Ambulatory Visit (INDEPENDENT_AMBULATORY_CARE_PROVIDER_SITE_OTHER): Payer: 59 | Admitting: Family

## 2011-03-16 ENCOUNTER — Encounter: Payer: Self-pay | Admitting: Family

## 2011-03-16 ENCOUNTER — Ambulatory Visit (HOSPITAL_BASED_OUTPATIENT_CLINIC_OR_DEPARTMENT_OTHER)
Admission: RE | Admit: 2011-03-16 | Discharge: 2011-03-16 | Disposition: A | Discharge status: Home / Self Care | Payer: 59 | Source: Ambulatory Visit | Attending: Family | Admitting: Family

## 2011-03-16 ENCOUNTER — Telehealth: Payer: Self-pay | Admitting: Family

## 2011-03-16 VITALS — BP 130/92 | HR 72 | Temp 98.2°F | Resp 16 | Ht 63.0 in | Wt 154.0 lb

## 2011-03-16 DIAGNOSIS — R1013 Epigastric pain: Secondary | ICD-10-CM

## 2011-03-16 DIAGNOSIS — R110 Nausea: Secondary | ICD-10-CM

## 2011-03-16 DIAGNOSIS — R630 Anorexia: Secondary | ICD-10-CM | POA: Insufficient documentation

## 2011-03-16 DIAGNOSIS — D3000 Benign neoplasm of unspecified kidney: Secondary | ICD-10-CM

## 2011-03-16 LAB — CBC WITH DIFFERENTIAL/PLATELET
Basophils Relative: 0 % (ref 0–1)
Eosinophils Absolute: 0.2 10*3/uL (ref 0.0–0.7)
HCT: 37.5 % (ref 36.0–46.0)
Hemoglobin: 12.6 g/dL (ref 12.0–15.0)
Lymphs Abs: 2.7 10*3/uL (ref 0.7–4.0)
MCHC: 33.6 g/dL (ref 30.0–36.0)
Monocytes Absolute: 0.5 10*3/uL (ref 0.1–1.0)
Monocytes Relative: 6 % (ref 3–12)
Neutro Abs: 4.5 10*3/uL (ref 1.7–7.7)
Neutrophils Relative %: 57 % (ref 43–77)
Platelets: 292 10*3/uL (ref 150–400)

## 2011-03-16 LAB — BASIC METABOLIC PANEL WITH GFR
BUN: 15 mg/dL (ref 6–23)
Calcium: 10.4 mg/dL (ref 8.4–10.5)
Chloride: 103 mEq/L (ref 96–112)
Creat: 0.7 mg/dL (ref 0.50–1.10)
GFR, Est African American: >60 mL/min (ref >60)
Glucose, Bld: 85 mg/dL (ref 70–99)
Potassium: 4.1 mEq/L (ref 3.5–5.3)

## 2011-03-16 LAB — HEPATIC FUNCTION PANEL
ALT: 15 U/L (ref 0–35)
AST: 16 U/L (ref 0–37)
Albumin: 4.6 g/dL (ref 3.5–5.2)
Alkaline Phosphatase: 76 U/L (ref 39–117)
Bilirubin, Direct: 0.1 mg/dL (ref 0.0–0.3)
Indirect Bilirubin: 0.6 mg/dL (ref 0.0–0.9)
Total Bilirubin: 0.7 mg/dL (ref 0.3–1.2)
Total Protein: 7.4 g/dL (ref 6.0–8.3)

## 2011-03-16 LAB — AMYLASE: Amylase: 32 U/L (ref 0–105)

## 2011-03-16 MED ORDER — OMEPRAZOLE 40 MG PO CPDR: 40.0000 mg | DELAYED_RELEASE_CAPSULE | Freq: Every day | ORAL | Status: DC

## 2011-03-16 NOTE — Patient Instructions (Signed)
Complete your blood work on the first floor now. Return to imaging dept at 5:30 this evening for your ultrasound. Follow up in 1 week. Go to ER if worsening abdominal pain.

## 2011-03-16 NOTE — Progress Notes (Signed)
Subjective:    Patient ID: Haley Fuentes, female    DOB: 02/26/1961, 50 y.o.   MRN: [id number]  HPI Ms.  [name] is a 50 yr old female who presents today with chief complaint of abdominal discomfort.   Abdominal pain started  3-4 days ago and is associated with bad lower abdominal cramping, nausea, "clamminess", anorexia and a "fullness"  in the epigastric area.   Denies associated vomitting, RUQ pain or fever. Stool yesterday was thin and light colored. For several days prior she noted some hard "lumpy" stools- "like balls." She has been tolerating small amounts of food and drink. She reports that 3 of her sisters have had history of cholecystitis.    She does report some left flank tenderness.     Review of Systems  Genitourinary: Positive for dysuria.   See HPI  No past medical history on file.  History   Social History  . Marital Status: Married    Spouse Name: N/A    Number of Children: N/A  . Years of Education: N/A   Occupational History  . Not on file.   Social History Main Topics  . Smoking status: Former Smoker    Types: Cigarettes    Quit date: 07/04/1985  . Smokeless tobacco: Not on file  . Alcohol Use: Not on file  . Drug Use: Not on file  . Sexually Active: Not on file   Other Topics Concern  . Not on file   Social History Narrative  . No narrative on file    Past Surgical History  Procedure Date  . Tubal ligation 1983    No family history on file.  Allergies  Allergen Reactions  . Hydrocodone-Acetaminophen   . Omnicef     vomitting    Current Outpatient Prescriptions on File Prior to Visit  Medication Sig Dispense Refill  . CYMBALTA 60 MG capsule TAKE 1 CAPSULE (60 MG TOTAL) BY MOUTH DAILY.  30 each  2  . estradiol (ESTRACE) 0.5 MG tablet Take 0.5 mg by mouth daily.        . ondansetron (ZOFRAN-ODT) 8 MG disintegrating tablet Take 1 tablet (8 mg total) by mouth every 8 (eight) hours as needed.  10 tablet  0  . Progesterone Micronized  (PROMETRIUM PO) Take by mouth.        . zolpidem (AMBIEN) 10 MG tablet 1/2 to 1 tab po qhs prn sleep  30 tablet  0    BP 130/92  Pulse 72  Temp(Src) 98.2 F (36.8 C) (Oral)  Resp 16  Ht 5\' 3"  (1.6 m)  Wt 154 lb (69.854 kg)  BMI 27.28 kg/m2       Objective:   Physical Exam  Constitutional: She appears well-developed and well-nourished. No distress.  HENT:  Head: Normocephalic and atraumatic.  Eyes: Conjunctivae are normal. Pupils are equal, round, and reactive to light.  Cardiovascular: Normal rate and regular rhythm.   No murmur heard. Pulmonary/Chest: Effort normal and breath sounds normal. No respiratory distress. She has no wheezes. She has no rales. She exhibits no tenderness.  Abdominal: Soft. She exhibits no distension and no mass. There is no rebound and no guarding.       +epigastric tenderness  Genitourinary:       Negative CVAT  Psychiatric: She has a normal mood and affect. Her speech is normal and behavior is normal. Judgment and thought content normal. Cognition and memory are normal.          Assessment &  Plan:

## 2011-03-16 NOTE — Telephone Encounter (Signed)
Please call pt and let her know that her ultrasound and lab work all look good.  Gallbladder is normal.  I would recommend that she start prilosec, and I will arrange a GI consult to further evaluate her pain.  She should call us if her symptoms worsen, or if no improvement in 1 week.

## 2011-03-17 DIAGNOSIS — R0789 Other chest pain: Secondary | ICD-10-CM | POA: Insufficient documentation

## 2011-03-17 DIAGNOSIS — R079 Chest pain, unspecified: Secondary | ICD-10-CM | POA: Insufficient documentation

## 2011-03-17 NOTE — Telephone Encounter (Signed)
Left message on machine to return to my call.

## 2011-03-17 NOTE — Assessment & Plan Note (Addendum)
CBC, LFT, amylase/lipase WNL.  Abdominal ultrasound is normal.  Add PPI, prn zofran and advance diet as tolerated. Recommend referral to GI for further evaluation.  See phone note.

## 2011-03-17 NOTE — Telephone Encounter (Signed)
Notified pt.  States she has been afraid to eat or drink anything. Per Efraim Kaufmann O'Sullivan,NP advised pt to advance her diet as tolerated and we can call in Zofran 4mg  1 tablet every 8 hours as needed for nausea # 20. Pt declines Rx at this time as she has nausea med left over from a recent illness that she can take. Advised pt to keep f/u in 1 week or earlier if symptoms worsen. Pt voices understanding. GI referral placed. Advised pt to call us Monday if she hasn't received a call re: referral.

## 2011-03-17 NOTE — Telephone Encounter (Signed)
Patient returned phone call. Best # [phone]

## 2011-03-18 ENCOUNTER — Telehealth: Payer: Self-pay | Admitting: *Deleted

## 2011-03-18 ENCOUNTER — Telehealth: Payer: Self-pay | Admitting: Gastroenterology

## 2011-03-18 NOTE — Telephone Encounter (Signed)
Error wrong doctor  

## 2011-03-18 NOTE — Telephone Encounter (Signed)
Pt scheduled for appt 04/18/11 pt put on wait list

## 2011-03-25 ENCOUNTER — Encounter (HOSPITAL_BASED_OUTPATIENT_CLINIC_OR_DEPARTMENT_OTHER): Payer: Self-pay

## 2011-03-25 ENCOUNTER — Other Ambulatory Visit (HOSPITAL_COMMUNITY): Payer: Self-pay | Admitting: Radiology

## 2011-03-25 ENCOUNTER — Other Ambulatory Visit: Payer: Self-pay

## 2011-03-25 ENCOUNTER — Emergency Department (HOSPITAL_BASED_OUTPATIENT_CLINIC_OR_DEPARTMENT_OTHER)
Admission: EM | Admit: 2011-03-25 | Discharge: 2011-03-25 | Disposition: A | Discharge status: Home / Self Care | Payer: 59 | Attending: Emergency Medicine | Admitting: Emergency Medicine

## 2011-03-25 ENCOUNTER — Encounter: Payer: Self-pay | Admitting: Family

## 2011-03-25 ENCOUNTER — Ambulatory Visit (INDEPENDENT_AMBULATORY_CARE_PROVIDER_SITE_OTHER): Payer: 59 | Admitting: Family

## 2011-03-25 DIAGNOSIS — R109 Unspecified abdominal pain: Secondary | ICD-10-CM | POA: Insufficient documentation

## 2011-03-25 DIAGNOSIS — D3000 Benign neoplasm of unspecified kidney: Secondary | ICD-10-CM | POA: Insufficient documentation

## 2011-03-25 DIAGNOSIS — R079 Chest pain, unspecified: Secondary | ICD-10-CM

## 2011-03-25 DIAGNOSIS — R1013 Epigastric pain: Secondary | ICD-10-CM | POA: Insufficient documentation

## 2011-03-25 DIAGNOSIS — R0789 Other chest pain: Secondary | ICD-10-CM

## 2011-03-25 LAB — DIFFERENTIAL
Basophils Absolute: 0 10*3/uL (ref 0.0–0.1)
Basophils Relative: 0 % (ref 0–1)
Eosinophils Absolute: 0.3 10*3/uL (ref 0.0–0.7)
Eosinophils Relative: 4 % (ref 0–5)
Monocytes Absolute: 0.4 10*3/uL (ref 0.1–1.0)
Monocytes Relative: 6 % (ref 3–12)
Neutro Abs: 4.5 10*3/uL (ref 1.7–7.7)

## 2011-03-25 LAB — COMPREHENSIVE METABOLIC PANEL
AST: 14 U/L (ref 0–37)
Albumin: 3.7 g/dL (ref 3.5–5.2)
BUN: 12 mg/dL (ref 6–23)
Calcium: 9.5 mg/dL (ref 8.4–10.5)
Creatinine, Ser: 0.5 mg/dL (ref 0.50–1.10)
Total Bilirubin: 0.4 mg/dL (ref 0.3–1.2)
Total Protein: 6.6 g/dL (ref 6.0–8.3)

## 2011-03-25 LAB — CBC
HCT: 31.9 % — ABNORMAL LOW (ref 36.0–46.0)
Hemoglobin: 10.8 g/dL — ABNORMAL LOW (ref 12.0–15.0)
MCH: 30.6 pg (ref 26.0–34.0)
MCHC: 33.9 g/dL (ref 30.0–36.0)
RDW: 12.4 % (ref 11.5–15.5)

## 2011-03-25 LAB — CK TOTAL AND CKMB (NOT AT ARMC)
CK, MB: 1.4 ng/mL (ref 0.3–4.0)
Relative Index: INVALID (ref 0.0–2.5)
Total CK: 39 U/L (ref 7–177)

## 2011-03-25 LAB — LIPASE, BLOOD: Lipase: 23 U/L (ref 11–59)

## 2011-03-25 LAB — TROPONIN I: Troponin I: <0.3 ng/mL (ref <0.30)

## 2011-03-25 MED ORDER — ASPIRIN 81 MG PO CHEW
CHEWABLE_TABLET | ORAL | Status: AC
  Administered 2011-03-25: 324 mg
  Filled 2011-03-25: qty 4

## 2011-03-25 MED ORDER — OMEPRAZOLE-SODIUM BICARBONATE 40-1100 MG PO CAPS: 1.0000 | ORAL_CAPSULE | Freq: Every day | ORAL | Status: DC

## 2011-03-25 NOTE — ED Notes (Signed)
Pt reports midsternal chest pain radiating to neck and shoulders.  She also has abdominal pain that is recurrent.

## 2011-03-25 NOTE — Progress Notes (Signed)
  Subjective:    Patient ID: Haley Fuentes, female    DOB: 22-Jun-1961, 50 y.o.   MRN: [id number]  HPI  [name] is a 50 yr old female who presents today for follow up of her abdominal pain.  1) Epigastric abdominal pain- lab work and ultrasound performed last visit were unremarkable.  She is eatig and drink "somewhat".  She feels like food just "sits" in the epigastric area.  She reports that her stools are hard balls.  She is having some chest pressure.   Pressure is "most of the time".  Worsens with deep breath. Not worsened by activity.  Denies shortness of breath.   She is taking prilosec, notes bad taste in her mouth after meals.    Review of Systems See HPI  No past medical history on file.  History   Social History  . Marital Status: Married    Spouse Name: N/A    Number of Children: N/A  . Years of Education: N/A   Occupational History  . Not on file.   Social History Main Topics  . Smoking status: Former Smoker    Types: Cigarettes    Quit date: 07/04/1985  . Smokeless tobacco: Not on file  . Alcohol Use: Not on file  . Drug Use: Not on file  . Sexually Active: Not on file   Other Topics Concern  . Not on file   Social History Narrative  . No narrative on file    Past Surgical History  Procedure Date  . Tubal ligation 1983    No family history on file.  Allergies  Allergen Reactions  . Hydrocodone-Acetaminophen   . Omnicef     vomitting    Current Outpatient Prescriptions on File Prior to Visit  Medication Sig Dispense Refill  . CYMBALTA 60 MG capsule TAKE 1 CAPSULE (60 MG TOTAL) BY MOUTH DAILY.  30 each  2  . estradiol (ESTRACE) 0.5 MG tablet Take 0.5 mg by mouth daily.        . Progesterone Micronized (PROMETRIUM PO) Take by mouth.        . zolpidem (AMBIEN) 10 MG tablet 1/2 to 1 tab po qhs prn sleep  30 tablet  0  . ondansetron (ZOFRAN-ODT) 8 MG disintegrating tablet Take 1 tablet (8 mg total) by mouth every 8 (eight) hours as needed.  10 tablet   0    BP 100/72  Pulse 66  Temp(Src) 97.8 F (36.6 C) (Oral)  Resp 16  Ht 5\' 3"  (1.6 m)  Wt 155 lb 1.3 oz (70.344 kg)  BMI 27.47 kg/m2       Objective:   Physical Exam  Constitutional: She appears well-developed and well-nourished.  Cardiovascular: Normal rate and regular rhythm.   No murmur heard. Pulmonary/Chest: Effort normal and breath sounds normal. No respiratory distress.  Abdominal: Soft. Bowel sounds are normal.       + epigastric tenderness without guarding.  Skin: Skin is warm and dry.  Psychiatric: She has a normal mood and affect. Her behavior is normal. Judgment and thought content normal.          Assessment & Plan:

## 2011-03-25 NOTE — Patient Instructions (Signed)
Please keep your upcoming apt with GI. Call if you develop nausea, vomitting or if you are unable to eat/drink. Go to the ER if you develop worsening chest pain or shortness of breath. Follow up in 1 month.

## 2011-03-25 NOTE — ED Notes (Signed)
Dr. Lanice Shirts with [facility] via [facility]

## 2011-03-25 NOTE — Assessment & Plan Note (Signed)
?   Due to ulcer.  She as using NSAIDS regularly until a few weeks ago.  She has an appointment with GI scheduled.  Will change Prilosec to Zegerid.

## 2011-03-25 NOTE — Assessment & Plan Note (Addendum)
EKG performed today notes new TWI in leads V4 and V5.  I have advised pt that since she is having ongoing chest pain and has driven herself here today, that she should be evaluated in the ED downstairs today.  Pt is agreeable.  I recommended that she add miralax once daily as needed for constipation.  Will switch prilosec to  Zegerid.  She does reveal to me today that she was taking aleve once daily until 3 weeks ago.  I have advised her to keep off of all NSAIDS. She may use tylenol PRN.

## 2011-03-25 NOTE — ED Provider Notes (Addendum)
History     CSN: [id number] Arrival date & time: 03/25/2011 10:47 AM  Chief Complaint  Patient presents with  . Chest Pain  . Abdominal Pain    HPI  (Consider location/radiation/quality/duration/timing/severity/associated sxs/prior treatment)  HPI Comments: Patient was at pcp office for a follow up appointment for abd pain.  Had [place] which was okay several days ago.  For the past week, she is now having some tightness in her chest which has in addition to the abd pain.  There has been no exertional component.  No radiation.  Had an ekg at pcp office which looked different than previous ekgs and was sent here.  Patient is a 50 y.o. female presenting with chest pain and abdominal pain. The history is provided by the patient.  Chest Pain The chest pain began 3 - 5 days ago. Chest pain occurs constantly. The chest pain is unchanged. The pain is associated with breathing. Primary symptoms include abdominal pain.    Abdominal Pain The primary symptoms of the illness include abdominal pain.    History reviewed. No pertinent past medical history.  Past Surgical History  Procedure Date  . Tubal ligation 1983  . Tubal ligation   . Endometrial ablation     No family history on file.  History  Substance Use Topics  . Smoking status: Former Smoker    Types: Cigarettes    Quit date: 07/04/1985  . Smokeless tobacco: Never Used  . Alcohol Use: Yes     weekends    OB History    Grav Para Term Preterm Abortions TAB SAB Ect Mult Living                  Review of Systems  Review of Systems  Cardiovascular: Positive for chest pain.  Gastrointestinal: Positive for abdominal pain.  All other systems reviewed and are negative.    Allergies  Hydrocodone-acetaminophen and Omnicef  Home Medications   Current Outpatient Rx  Name Route Sig Dispense Refill  . CYMBALTA 60 MG PO CPEP  TAKE 1 CAPSULE (60 MG TOTAL) BY MOUTH DAILY. 30 each 2    CYCLE FILL MEDICATION. Authorization is  required f ...  . ESTRADIOL 0.5 MG PO TABS Oral Take 0.5 mg by mouth daily.      Marland Kitchen OMEPRAZOLE-SODIUM BICARBONATE 40-1100 MG PO CAPS Oral Take 1 capsule by mouth daily before breakfast. 30 capsule 1  . ONDANSETRON 8 MG PO TBDP Oral Take 1 tablet (8 mg total) by mouth every 8 (eight) hours as needed. 10 tablet 0  . POLYETHYLENE GLYCOL 3350 PO POWD Oral Take 17 g by mouth daily.      Marland Kitchen PROMETRIUM PO Oral Take by mouth.      . ZOLPIDEM TARTRATE 10 MG PO TABS  1/2 to 1 tab po qhs prn sleep 30 tablet 0    CYCLE FILL MEDICATION. Authorization is required f ...    Physical Exam    BP 111/71  Temp(Src) 97.9 F (36.6 C) (Oral)  Ht 5\' 3"  (1.6 m)  Wt 155 lb (70.308 kg)  BMI 27.46 kg/m2  SpO2 100%  Physical Exam  Constitutional: She is oriented to person, place, and time. She appears well-developed and well-nourished. No distress.  HENT:  Head: Normocephalic and atraumatic.  Neck: Normal range of motion. Neck supple.  Cardiovascular: Normal rate and regular rhythm.  Exam reveals no gallop and no friction rub.   No murmur heard. Pulmonary/Chest: Effort normal and breath sounds normal. No respiratory distress.  Abdominal: Soft. She exhibits no distension. There is no tenderness.  Musculoskeletal: Normal range of motion.  Neurological: She is alert and oriented to person, place, and time.  Skin: Skin is warm and dry. She is not diaphoretic.  Psychiatric: She has a normal mood and affect.    ED Course  Procedures (including critical care time)  Labs Reviewed  CBC - Abnormal; Notable for the following:    RBC 3.53 (*)    Hemoglobin 10.8 (*)    HCT 31.9 (*)    All other components within normal limits  DIFFERENTIAL  COMPREHENSIVE METABOLIC PANEL  CK TOTAL AND CKMB  TROPONIN I  LIPASE, BLOOD   US Abdomen Complete  03/16/2011  *RADIOLOGY REPORT*  Clinical Data:  50 year old female with epigastric pain and anorexia.  Nausea times 4 days.  Known right renal angiomyolipoma.  COMPLETE  ABDOMINAL ULTRASOUND  Comparison:  [facility] noncontrast CT abdomen 09/25/2007.  Findings:  Gallbladder:  No gallstones, gallbladder wall thickening, or pericholecystic fluid. No sonographic Murphy's sign elicited.  Common bile duct:  Normal measuring 2 mm in diameter.  Liver:  No focal lesion identified.  Within normal limits in parenchymal echogenicity.  IVC:  Incompletely visualized due to overlying bowel gas, visualized portions within normal limits.  Pancreas:  Incompletely visualized due to overlying bowel gas, visualized portions within normal limits.  Spleen:  Normal measuring 9.4 cm in length.  Right Kidney:  Stable 25 mm diameter midpole lesion with hyperechogenicity compatible with the macroscopic fat seen on the comparison.  No other right renal lesion.  No hydronephrosis. Renal length 10.9 cm.  Left Kidney:  Normal measuring 10.6 cm in length.  Abdominal aorta:  No aneurysm identified.  IMPRESSION: 1.  Negative gallbladder.  No acute findings in the abdomen. 2.  Right renal mid pole angiomyolipoma stable since 2009.  Original Report Authenticated By: Harley Hallmark, M.D.    Date: 04/22/2011  Rate: 61  Rhythm: normal sinus rhythm  QRS Axis: normal  Intervals: normal  ST/T Wave abnormalities: nonspecific ST changes  Conduction Disutrbances:none  Narrative Interpretation:   Old EKG Reviewed: none available    No diagnosis found.   MDM CT looks okay.  Will discharge and follow up as needed.        Geoffery Lyons, MD 04/01/11 1610  Geoffery Lyons, MD 04/22/11 [phone]

## 2011-03-28 ENCOUNTER — Ambulatory Visit (HOSPITAL_COMMUNITY): Payer: 59 | Attending: Internal Medicine | Admitting: Radiology

## 2011-03-28 ENCOUNTER — Telehealth: Payer: Self-pay

## 2011-03-28 ENCOUNTER — Telehealth: Payer: Self-pay | Admitting: Family

## 2011-03-28 VITALS — Ht 63.0 in | Wt 150.0 lb

## 2011-03-28 DIAGNOSIS — R079 Chest pain, unspecified: Secondary | ICD-10-CM | POA: Insufficient documentation

## 2011-03-28 DIAGNOSIS — R0789 Other chest pain: Secondary | ICD-10-CM

## 2011-03-28 DIAGNOSIS — R9431 Abnormal electrocardiogram [ECG] [EKG]: Secondary | ICD-10-CM

## 2011-03-28 MED ORDER — TECHNETIUM TC 99M TETROFOSMIN IV KIT
10.5000 | PACK | Freq: Once | INTRAVENOUS | Status: AC | PRN
  Administered 2011-03-28: 11 via INTRAVENOUS

## 2011-03-28 MED ORDER — TECHNETIUM TC 99M TETROFOSMIN IV KIT
33.0000 | PACK | Freq: Once | INTRAVENOUS | Status: AC | PRN
  Administered 2011-03-28: 33 via INTRAVENOUS

## 2011-03-28 NOTE — Telephone Encounter (Signed)
Office closed for lunch

## 2011-03-28 NOTE — Telephone Encounter (Signed)
Message copied by Donata Duff on Mon Mar 28, 2011  3:32 PM ------      Message from: Rob Bunting P      Created: Mon Mar 28, 2011  3:25 PM             Blyss Lugar,      Can u put her in with me tomorrow AM in the office for new GI appt?  Looks like i have time.                  Melissa,      We'll try to get her in tomorrow.  If she can't, then may need to put her on with PA or other MD as I am going on vacation the end of the week.            dj                        ----- Message -----         From: Katrinka Blazing. Peggyann Juba, NP         Sent: 03/28/2011  12:03 PM           To: Rob Bunting, MD            I am sorry, the wrong pt got linked to that message.  This is the correct patient .            Thanks,       [employer]

## 2011-03-28 NOTE — Telephone Encounter (Signed)
Called pt to follow-up. Left message for her to return my call.

## 2011-03-28 NOTE — Telephone Encounter (Signed)
Pt appt moved to 03/29/11 845 am pt has been notified

## 2011-03-28 NOTE — Telephone Encounter (Signed)
Received call from Haley Fuentes at Dr Christella Hartigan office, they have scheduled pt to see Dr Christella Hartigan tomorrow at 8:45am. They have left a message on her voicemail re: appt change. Notified pt of new date/time.

## 2011-03-28 NOTE — Telephone Encounter (Signed)
Spoke with Haley Fuentes, she is having stress test today.  She denies melena, has apt scheduled for tomorrow with Dr. Christella Hartigan.  I asked GI to move up due to downward drift of hgb and my concern re: bleeding ulcer.  Haley Fuentes verbalizes understanding.

## 2011-03-28 NOTE — Progress Notes (Signed)
[facility] SITE 3 NUCLEAR MED [address] [place] [address] [phone]  Cardiology Nuclear Med Study  Colbi Schiltz is a 50 y.o. female [id number] [birth year]   Nuclear Med Background Indication for Stress Test:  Evaluation for Ischemia,03/[facility]/11 [facility] with epigastric pain and Abnormal EKG History:  No previous documented CAD Cardiac Risk Factors: Family History - CAD, History of Smoking and Lipids  Symptoms:  Chest Pain   Nuclear Pre-Procedure Caffeine/Decaff Intake:  None NPO After: 8:30am   Lungs:  clear IV 0.9% NS with Angio Cath:  20g  IV Site: R Hand  IV Started by:  Cathlyn Parsons, RN  Chest Size (in):  38 Cup Size: D  Height: 5\' 3"  (1.6 m)  Weight:  150 lb (68.04 kg)  BMI:  Body mass index is 26.57 kg/(m^2). Tech Comments:  NA    Nuclear Med Study 1 or 2 day study: 1 day  Stress Test Type:  Stress  Reading MD: Charlton Haws, MD  Order Authorizing Provider:  D.Bensimhon  Resting Radionuclide: Technetium 3m Tetrofosmin  Resting Radionuclide Dose: 10.5 mCi   Stress Radionuclide:  Technetium 76m Tetrofosmin  Stress Radionuclide Dose: 33 mCi           Stress Protocol Rest HR: 57 Stress HR: 148  Rest BP: 117/79 Stress BP: 138/66  Exercise Time (min): 8:00 METS: 10.10   Predicted Max HR: 170 bpm % Max HR: 87.06 bpm Rate Pressure Product: 41324   Dose of Adenosine (mg):  n/a Dose of Lexiscan: n/a mg  Dose of Atropine (mg): n/a Dose of Dobutamine: n/a mcg/kg/min (at max HR)  Stress Test Technologist: Milana Na, EMT-P  Nuclear Technologist:  Domenic Polite, CNMT     Rest Procedure:  Myocardial perfusion imaging was performed at rest 45 minutes following the intravenous administration of Technetium 58m Tetrofosmin. Rest ECG: Sinus Bradycardia  Stress Procedure:  The patient exercised for 8:00.  The patient stopped due to fatigue and 5/10 chest tightness/pressure.  There were non specific ST-T wave changes.   Technetium 37m Tetrofosmin was injected at peak exercise and myocardial perfusion imaging was performed after a brief delay. Stress ECG: No significant change from baseline ECG  QPS Raw Data Images:  Normal; no motion artifact; normal heart/lung ratio. Stress Images:  Normal homogeneous uptake in all areas of the myocardium. Rest Images:  Normal homogeneous uptake in all areas of the myocardium. Subtraction (SDS):  Normal Transient Ischemic Dilatation (Normal <1.22):  1.12 Lung/Heart Ratio (Normal <0.45):  .34  Quantitative Gated Spect Images QGS EDV:  74 ml QGS ESV:  27 ml QGS cine images:  NL LV Function; NL Wall Motion QGS EF: 64%  Impression Exercise Capacity:  Fair exercise capacity. BP Response:  Normal blood pressure response. Clinical Symptoms:  Mild chest pain/dyspnea. ECG Impression:  No significant ST segment change suggestive of ischemia. Comparison with Prior Nuclear Study: No images to compare  Overall Impression:  Normal stress nuclear study.   Charlton Haws

## 2011-03-28 NOTE — Telephone Encounter (Signed)
Haley Fuentes wants appt moved up sooner

## 2011-03-29 ENCOUNTER — Ambulatory Visit (INDEPENDENT_AMBULATORY_CARE_PROVIDER_SITE_OTHER): Payer: 59 | Admitting: Gastroenterology

## 2011-03-29 ENCOUNTER — Encounter: Payer: Self-pay | Admitting: Gastroenterology

## 2011-03-29 VITALS — BP 104/68 | HR 68 | Ht 63.0 in | Wt 152.4 lb

## 2011-03-29 DIAGNOSIS — R1013 Epigastric pain: Secondary | ICD-10-CM

## 2011-03-29 NOTE — Progress Notes (Signed)
.  Review of pertinent gastrointestinal problems: 1. Mild intermittent constipation, probably functional. Colonoscopy February 2009 was normal including look in the terminal ileum. She had small external hemorrhoids. CBC, complete metabolic profile, thyroid testing all normal February 2009.  2. Intermittent pill-associated dysphagia. EGD February 2009 was normal except for several small biopsy-proven fundic gland polyps, nonerosive GERD likely playing a role.  HPI: This is a   very pleasant 50 year old woman whom I last saw over 3 years ago.  Had stress test yesterday, she was told that it looked OK.  Was given nitro for chest discomforts.  Had abnormal EKG.   a cardiologist or he reviewed the stress test and wrote that it was a normal stress test.  This began with retrosternal chest discomforts.  Came back from Vacation one month ago; fatigued, anorexia.  Then had GE like symptoms (vomiting, diarrhea).  Was given antiobiotics for concomitant sinusitis.    Since then poor appetite,  Cramping when she would eat.  That pain subsided, moved upwards to mid epigastrium.  Had a lot  She has been taking alleve, usually 1-2 pills per day.  Also periodic ASA.    Was started on prilosec one pill daily for about 2 weeks.    No black colored stools, but she currently alternates solid/liquid stools.  + SOB with exertion. + dry cough in evening.      Review of systems: Pertinent positive and negative review of systems were noted in the above HPI section.  All other review of systems was otherwise negative.   No past medical history on file.  Past Surgical History  Procedure Date  . Tubal ligation 1983  . Tubal ligation   . Endometrial ablation      reports that she quit smoking about 25 years ago. Her smoking use included Cigarettes. She has never used smokeless tobacco. She reports that she drinks alcohol. She reports that she does not use illicit drugs.  family history is not on  file.    Current Medications, Allergies were all reviewed with the patient via [facility] electronic medical record system.    Physical Exam: BP 104/68  Pulse 68  Ht 5\' 3"  (1.6 m)  Wt 152 lb 6.4 oz (69.128 kg)  BMI 27.00 kg/m2 Constitutional: generally well-appearing Psychiatric: alert and oriented x3 Eyes: extraocular movements intact Mouth: oral pharynx moist, no lesions Neck: supple no lymphadenopathy Cardiovascular: heart regular rate and rhythm Lungs: clear to auscultation bilaterally Abdomen: soft, mild epigastric tenderness, nondistended, no obvious ascites, no peritoneal signs, normal bowel sounds Extremities: no lower extremity edema bilaterally Skin: no lesions on visible extremities    Assessment and plan: 50 y.o. female with mild epigastric tenderness, intermittent chest pains, nausea, postprandial discomforts  I did not mention above that her hemoglobin dropped from 12.8 210.8 over the past few weeks. She has had no overt GI bleeding. This may be peptic ulcer related given her daily NSAID use. Alternatively she may have postinfectious gastroparesis, IBS. She did suffer what sounds like a viral gastroenteritis about a month ago and has really not gotten back to normal since then. Abdominal ultrasound was performed at some point showing a normal gallbladder. Basic metabolic profile and liver tests were all normal. We will proceed with EGD tomorrow. She will double her proton pump inhibitor for now.

## 2011-03-29 NOTE — Patient Instructions (Signed)
You will be set up for an upper endoscopy tomorrow at [facility]. Samples of PPI given, take one pill twice daily for now (use your prilosec as one of those a day). A copy of this information will be made available to Dr. Peggyann Juba.

## 2011-03-30 ENCOUNTER — Ambulatory Visit (AMBULATORY_SURGERY_CENTER): Payer: 59 | Admitting: Gastroenterology

## 2011-03-30 ENCOUNTER — Encounter: Payer: Self-pay | Admitting: Gastroenterology

## 2011-03-30 VITALS — BP 113/70 | HR 65 | Temp 97.9°F | Resp 20 | Ht 63.0 in | Wt 156.0 lb

## 2011-03-30 DIAGNOSIS — K2970 Gastritis, unspecified, without bleeding: Secondary | ICD-10-CM

## 2011-03-30 DIAGNOSIS — K2940 Chronic atrophic gastritis without bleeding: Secondary | ICD-10-CM

## 2011-03-30 DIAGNOSIS — R1013 Epigastric pain: Secondary | ICD-10-CM

## 2011-03-30 MED ORDER — SUCRALFATE 1 GM/10ML PO SUSP: 1.0000 g | Freq: Three times a day (TID) | ORAL | Status: DC

## 2011-03-30 MED ORDER — SODIUM CHLORIDE 0.9 % IV SOLN: 500.0000 mL | INTRAVENOUS | Status: DC

## 2011-03-31 ENCOUNTER — Telehealth: Payer: Self-pay

## 2011-03-31 ENCOUNTER — Telehealth: Payer: Self-pay | Admitting: *Deleted

## 2011-03-31 ENCOUNTER — Telehealth: Payer: Self-pay | Admitting: Gastroenterology

## 2011-03-31 ENCOUNTER — Encounter: Payer: Self-pay | Admitting: *Deleted

## 2011-03-31 NOTE — Telephone Encounter (Signed)
No ID on answering machine. 

## 2011-03-31 NOTE — Telephone Encounter (Signed)
Received message from pt stating she had her endoscopy and was told she has acute gastritis. Has questions about anemia in light of new diagnosis. Attempted to reach pt and left message on voicemail to return my call.

## 2011-03-31 NOTE — Telephone Encounter (Signed)
Patient complained of a "golf ball" in her throat, and feels like she is "losing her voice."   States it feels like phlegm.   Patient states that she can eat and drink without problems, yet she is able to breathe but "feels restricted."  Dr. Christella Hartigan notified, and he said for the patient to "gargle with salt water, and call us tomorrow if she doesn't feel any better."   Patient was notified about Dr's orders, and she was okay with that.   She was also told to contact us if her symptoms became worse.

## 2011-04-01 ENCOUNTER — Telehealth: Payer: Self-pay | Admitting: Gastroenterology

## 2011-04-01 NOTE — Telephone Encounter (Signed)
Pt states she has read that gastritis can cause anemia. Wants to know if that is the cause for her anemia? Please advise.

## 2011-04-01 NOTE — Telephone Encounter (Signed)
Pt called to state that she is feeling much better and has no issues to report.

## 2011-04-01 NOTE — Telephone Encounter (Signed)
Yes gastritis can cause anemia as she may have some mild oozing of blood from the irritation in her stomach.  I would recommend that she add a multivitamin with iron once daily.  Follow up in 1 month so that we can follow up on her anemia. Call us if black tarry or bloody stools.

## 2011-04-01 NOTE — Telephone Encounter (Signed)
Notified pt and scheduled f/u for 04/27/11 @ 2:30pm.

## 2011-04-01 NOTE — Telephone Encounter (Signed)
Left message on machine to return my call. 

## 2011-04-11 ENCOUNTER — Telehealth: Payer: Self-pay | Admitting: Gastroenterology

## 2011-04-11 NOTE — Telephone Encounter (Signed)
ok 

## 2011-04-11 NOTE — Telephone Encounter (Signed)
Pt says she is feeling better still has some pain but not as bad, she will call with any further problems or changes    Notes Recorded by Chales Abrahams, CMA on 04/11/2011 at 1:45 PM Left message on machine to call back Notes Recorded by Chales Abrahams, CMA on 04/11/2011 at 8:33 AM Left message on machine to call back Notes Recorded by Rob Bunting, MD on 04/10/2011 at 3:11 PM   Haley Fuentes, please call the patient. Biopsies showed no h.pylori. Ask how she is feeling with carafate (and her usual PPI twice daily)

## 2011-04-18 ENCOUNTER — Ambulatory Visit: Payer: 59 | Admitting: Gastroenterology

## 2011-04-21 NOTE — Telephone Encounter (Signed)
See other phone message  

## 2011-04-26 ENCOUNTER — Encounter: Payer: Self-pay | Admitting: Family

## 2011-04-27 ENCOUNTER — Encounter: Payer: Self-pay | Admitting: Family

## 2011-04-27 ENCOUNTER — Ambulatory Visit (INDEPENDENT_AMBULATORY_CARE_PROVIDER_SITE_OTHER): Payer: 59 | Admitting: Family

## 2011-04-27 VITALS — BP 104/78 | HR 78 | Temp 97.8°F | Resp 16 | Wt 154.1 lb

## 2011-04-27 DIAGNOSIS — R1013 Epigastric pain: Secondary | ICD-10-CM

## 2011-04-27 DIAGNOSIS — R0789 Other chest pain: Secondary | ICD-10-CM

## 2011-04-27 DIAGNOSIS — D649 Anemia, unspecified: Secondary | ICD-10-CM

## 2011-04-27 LAB — CBC WITH DIFFERENTIAL/PLATELET
Basophils Absolute: 0 10*3/uL (ref 0.0–0.1)
Basophils Relative: 0 % (ref 0–1)
Eosinophils Relative: 2 % (ref 0–5)
HCT: 35.3 % — ABNORMAL LOW (ref 36.0–46.0)
MCHC: 32.3 g/dL (ref 30.0–36.0)
Monocytes Absolute: 0.5 10*3/uL (ref 0.1–1.0)
Neutro Abs: 5 10*3/uL (ref 1.7–7.7)
Platelets: 281 10*3/uL (ref 150–400)
RDW: 13.1 % (ref 11.5–15.5)
WBC: 8.1 10*3/uL (ref 4.0–10.5)

## 2011-04-27 MED ORDER — SODIUM BICARBONATE 650 MG PO TABS: ORAL_TABLET | ORAL | Status: DC

## 2011-04-27 MED ORDER — MOMETASONE FUROATE 50 MCG/ACT NA SUSP: 2.0000 | Freq: Every day | NASAL | Status: DC

## 2011-04-27 NOTE — Progress Notes (Signed)
Subjective:    Patient ID: Haley Fuentes, female    DOB: 1961/06/09, 50 y.o.   MRN: [id number]  HPI  [name] is a 50 yr old female who presents today for follow up.   Atypical Chest pain- She was seen in the ED last visit for her chest pain. She had a negative set of cardiac enzymes and was sent home.  She also had a negative stress test on 03/28/11.  Epigastric pain- She reports that she continues to have epigastric pain and anorexia.  She has lost about 9 pounds since July. She had and EGD performed by Dr. Christella Hartigan on 9/26 which noted gastritis. It was recommended that she remain off of NSAIDS, start carafate and take PPI BID.  She tells me that she often has trouble remembering the second PPI dose each day.    Anemia- Prior to her visit with GI she was noted to be anemic with a hemoglobin of 10.    Left hip pain- notes iliopsoas pain.  She is followed by Dr. Ethelene Hal.    Review of Systems    see HPI  Past Medical History  Diagnosis Date  . Hemorrhoids   . Gastric polyp   . Depression   . Anemia     hx of iron deficient anemia  . Joint pain   . Postmenopausal     History   Social History  . Marital Status: Married    Spouse Name: N/A    Number of Children: 2  . Years of Education: N/A   Occupational History  . [employer]    Social History Main Topics  . Smoking status: Former Smoker    Types: Cigarettes    Quit date: 07/04/1985  . Smokeless tobacco: Never Used  . Alcohol Use: Yes     weekends  . Drug Use: No  . Sexually Active: Not on file   Other Topics Concern  . Not on file   Social History Narrative   Regular exercise: 30 minutes 5 days a week    Past Surgical History  Procedure Date  . Endometrial ablation 2007    Novasure  . Tubal ligation 1983  . Tubal ligation     Family History  Problem Relation Age of Onset  . Diabetes Mother   . Heart disease Mother   . Cancer Mother 56    breast cancer  . Stroke Father   . Diabetes Father   .  Heart disease Father     CABG, AVR, A-Fib  . Coronary artery disease Sister     Allergies  Allergen Reactions  . Hydrocodone-Acetaminophen   . Omnicef     vomitting    Current Outpatient Prescriptions on File Prior to Visit  Medication Sig Dispense Refill  . CYMBALTA 60 MG capsule TAKE 1 CAPSULE (60 MG TOTAL) BY MOUTH DAILY.  30 each  2  . estradiol (ESTRACE) 0.5 MG tablet Take 0.5 mg by mouth daily.        . Multiple Vitamins-Minerals (MULTIVITAMIN WITH MINERALS) tablet 1 tablet.        Marland Kitchen omeprazole (PRILOSEC) 40 MG capsule Take 40 mg by mouth 2 (two) times daily.       . Progesterone Micronized (PROMETRIUM PO) Take by mouth.        . sucralfate (CARAFATE) 1 GM/10ML suspension Take 10 mLs (1 g total) by mouth 3 (three) times daily.  420 mL  2  . zolpidem (AMBIEN) 10 MG tablet 1/2 to 1  tab po qhs prn sleep  30 tablet  0  . ondansetron (ZOFRAN-ODT) 8 MG disintegrating tablet Take 1 tablet (8 mg total) by mouth every 8 (eight) hours as needed.  10 tablet  0  . polyethylene glycol powder (MIRALAX) powder Take 17 g by mouth daily.          BP 104/78  Pulse 78  Temp(Src) 97.8 F (36.6 C) (Oral)  Resp 16  Wt 154 lb 1.9 oz (69.908 kg)    Objective:   Physical Exam  Constitutional: She appears well-developed and well-nourished.  HENT:  Head: Normocephalic and atraumatic.  Cardiovascular: Normal rate and regular rhythm.   No murmur heard. Pulmonary/Chest: Effort normal and breath sounds normal. No respiratory distress. She has no wheezes. She has no rales. She exhibits no tenderness.  Abdominal: Soft. She exhibits no distension and no mass. There is no tenderness. There is no rebound and no guarding.  Skin: Skin is warm and dry.  Psychiatric: She has a normal mood and affect. Her behavior is normal. Judgment and thought content normal.          Assessment & Plan:

## 2011-04-27 NOTE — Patient Instructions (Signed)
You will be contacted about your referral for the HIDA scan. Complete your lab work prior to leaving. Return fasting one morning at your convenience for your cholesterol.  Please follow up in 2 months, sooner if problems or concerns.

## 2011-04-29 ENCOUNTER — Telehealth: Payer: Self-pay | Admitting: Family

## 2011-04-29 ENCOUNTER — Telehealth: Payer: Self-pay | Admitting: *Deleted

## 2011-04-29 DIAGNOSIS — D649 Anemia, unspecified: Secondary | ICD-10-CM | POA: Insufficient documentation

## 2011-04-29 DIAGNOSIS — Z0000 Encounter for general adult medical examination without abnormal findings: Secondary | ICD-10-CM

## 2011-04-29 NOTE — Telephone Encounter (Signed)
Order entered and forwarded to the lab.    Lemont Fillers., NP More Detail >>      Katrinka Blazing. Peggyann Juba, NP        Sent: Wed April 27, 2011  3:21 PM    To: Mervin Kung, CMA         [place]    MRN: [id number] DOB: 01/22/1961     Pt Work: [phone] Pt Home: [phone]           Message     Pls send order to the lab for lipid panel V70

## 2011-04-29 NOTE — Assessment & Plan Note (Addendum)
Had gastritis on EGD.  Reinforced importance of compliance with the BID PPI dosing.  She was unable to afford zegerid so I have suggested that she add OTC sodium bicarb tabs to her omeprazole instead. Continue carafate. Had unremarkable GB on ultrasound, but will order HIDA to exclude gallbladder dysfunction.  Pt is very concerned about her lack of improvement.  I provided her with reassurance that it is not due to her heart.

## 2011-04-29 NOTE — Assessment & Plan Note (Signed)
Unchanged.  Stress test is negative.  I suspect that this is either GI in nature or due to anxiety.  Await HIDA results.

## 2011-04-29 NOTE — Telephone Encounter (Signed)
Spoke with physician at [facility] re: Haley Fuentes peer to peer.  Haley Fuentes was approved.  # is [phone] exp 12/10.

## 2011-04-29 NOTE — Assessment & Plan Note (Signed)
Improved.  Continue off of NSAIDS and continue MVI with minerals.

## 2011-05-02 ENCOUNTER — Telehealth: Payer: Self-pay | Admitting: Family

## 2011-05-02 MED ORDER — OMEPRAZOLE 40 MG PO CPDR: 40.0000 mg | DELAYED_RELEASE_CAPSULE | Freq: Two times a day (BID) | ORAL | Status: DC

## 2011-05-02 NOTE — Telephone Encounter (Signed)
Omeprazole 40MG   Capsules   #30   Take 1 capsule by mouth every day(   Need update rx  Pt stated new direction is one capsule twice daily)  Last refill   03/16/11

## 2011-05-02 NOTE — Telephone Encounter (Signed)
Refill sent to pharmacy for omeprazole 40mg  1 capsule twice a day #60 x 2 refills.

## 2011-05-09 ENCOUNTER — Encounter: Payer: Self-pay | Admitting: Family

## 2011-05-09 NOTE — Telephone Encounter (Signed)
Please advise 

## 2011-05-18 ENCOUNTER — Other Ambulatory Visit (HOSPITAL_COMMUNITY): Payer: 59

## 2011-05-24 NOTE — Telephone Encounter (Signed)
Left message on machine to return my call. 

## 2011-05-24 NOTE — Telephone Encounter (Signed)
Notified pt and she states she will return next week for fasting lipid panel.

## 2011-06-06 ENCOUNTER — Telehealth: Payer: Self-pay | Admitting: *Deleted

## 2011-06-06 NOTE — Telephone Encounter (Signed)
Pt notified and states she has f/u with [place] on 06/24/11 and has enough Prozac to last until her next appt.

## 2011-06-06 NOTE — Telephone Encounter (Signed)
Received call from pt stating Cymbalta is not a preferred medication on her ins. Formulary. Venlafaxine is a preferred alternative and she would like to change to that. Pt has been off of Cymbalta and has been taking left over Prozac. [facility] [address]. Please advise.

## 2011-06-06 NOTE — Telephone Encounter (Signed)
I would like to see her in the office to discuss her depression first.  I have not talked with her much about this- I think that she had followed with Dr. Cathey Endow back in the spring.  She should continue the prozac until she is seen in the office.

## 2011-06-24 ENCOUNTER — Encounter: Payer: Self-pay | Admitting: Family

## 2011-06-24 ENCOUNTER — Ambulatory Visit (INDEPENDENT_AMBULATORY_CARE_PROVIDER_SITE_OTHER): Payer: 59 | Admitting: Family

## 2011-06-24 VITALS — BP 108/70 | HR 69 | Temp 97.6°F | Resp 16 | Ht 63.0 in | Wt 154.0 lb

## 2011-06-24 DIAGNOSIS — F329 Major depressive disorder, single episode, unspecified: Secondary | ICD-10-CM

## 2011-06-24 DIAGNOSIS — R1013 Epigastric pain: Secondary | ICD-10-CM

## 2011-06-24 DIAGNOSIS — E785 Hyperlipidemia, unspecified: Secondary | ICD-10-CM

## 2011-06-24 DIAGNOSIS — Z23 Encounter for immunization: Secondary | ICD-10-CM

## 2011-06-24 DIAGNOSIS — F3289 Other specified depressive episodes: Secondary | ICD-10-CM

## 2011-06-24 LAB — LIPID PANEL: LDL Cholesterol: 217 mg/dL — ABNORMAL HIGH (ref 0–99)

## 2011-06-24 MED ORDER — ZOLPIDEM TARTRATE 10 MG PO TABS: ORAL_TABLET | ORAL | Status: DC

## 2011-06-24 MED ORDER — FLUOXETINE HCL 20 MG PO CAPS: 20.0000 mg | ORAL_CAPSULE | Freq: Every day | ORAL | Status: DC

## 2011-06-24 NOTE — Progress Notes (Signed)
Subjective:    Patient ID: Haley Fuentes, female    DOB: 08-17-1960, 50 y.o.   MRN: [id number]  HPI  Haley Fuentes is a 50 yr old female who presents today for follow up.  Epigastic-   Reports near resolution of the epigastric pain. On prilosec and sodium bicarb.  She is not using the carafate.  Depression-  Currently on prozac.  She reports that  Dr. Cathey Endow put her on cymbalta.  She feels that she is experiencing a little bit more emotion- but reports that she is dealing with some financial hardship. Thinks this is situational. Overall reports sleeping pretty well, denies unusual tearfulness.    She will see Dermatogy to evaluate all her moles.  This was arranged by GYN. Review of Systems See HPI  Past Medical History  Diagnosis Date  . Hemorrhoids   . Gastric polyp   . Depression   . Anemia     hx of iron deficient anemia  . Joint pain   . Postmenopausal     History   Social History  . Marital Status: Married    Spouse Name: N/A    Number of Children: 2  . Years of Education: N/A   Occupational History  . [employer]    Social History Main Topics  . Smoking status: Former Smoker    Types: Cigarettes    Quit date: 07/04/1985  . Smokeless tobacco: Never Used  . Alcohol Use: Yes     weekends  . Drug Use: No  . Sexually Active: Not on file   Other Topics Concern  . Not on file   Social History Narrative   Regular exercise: 30 minutes 5 days a week    Past Surgical History  Procedure Date  . Endometrial ablation 2007    Novasure  . Tubal ligation 1983  . Tubal ligation     Family History  Problem Relation Age of Onset  . Diabetes Mother   . Heart disease Mother   . Cancer Mother 44    breast cancer  . Stroke Father   . Diabetes Father   . Heart disease Father     CABG, AVR, A-Fib  . Coronary artery disease Sister     Allergies  Allergen Reactions  . Hydrocodone-Acetaminophen   . Omnicef     vomitting    Current Outpatient Prescriptions on  File Prior to Visit  Medication Sig Dispense Refill  . estradiol (ESTRACE) 0.5 MG tablet Take 0.5 mg by mouth daily.        . mometasone (NASONEX) 50 MCG/ACT nasal spray Place 2 sprays into the nose daily.  17 g  2  . Multiple Vitamins-Minerals (MULTIVITAMIN WITH MINERALS) tablet 1 tablet.        Marland Kitchen omeprazole (PRILOSEC) 40 MG capsule Take 1 capsule (40 mg total) by mouth 2 (two) times daily.  60 capsule  2  . Progesterone Micronized (PROMETRIUM PO) Take by mouth.        . sodium bicarbonate 650 MG tablet Two tablets by mouth twice daily  120 tablet  2  . sucralfate (CARAFATE) 1 GM/10ML suspension Take 10 mLs (1 g total) by mouth 3 (three) times daily.  420 mL  2    BP 108/70  Pulse 69  Temp(Src) 97.6 F (36.4 C) (Oral)  Resp 16  Ht 5\' 3"  (1.6 m)  Wt 154 lb (69.854 kg)  BMI 27.28 kg/m2  SpO2 98%       Objective:  Physical Exam  Constitutional: She appears well-developed and well-nourished. No distress.  Cardiovascular: Normal rate and regular rhythm.   No murmur heard. Pulmonary/Chest: Effort normal and breath sounds normal. No respiratory distress. She has no wheezes. She has no rales. She exhibits no tenderness.  Musculoskeletal: She exhibits no edema.  Skin: Skin is warm and dry.  Psychiatric: She has a normal mood and affect. Her behavior is normal. Judgment and thought content normal.          Assessment & Plan:

## 2011-06-24 NOTE — Assessment & Plan Note (Signed)
·   Obtain FLP

## 2011-06-24 NOTE — Assessment & Plan Note (Signed)
She feels that this is overall well controlled with prozac and wishes to continue.

## 2011-06-24 NOTE — Patient Instructions (Signed)
Please complete your blood work prior to leaving.  Follow up in 3 months. Have a [place] Christmas!

## 2011-06-24 NOTE — Assessment & Plan Note (Signed)
Pt reports that this is greatly improved with sodium bicar/omeprazole.  She did not pursue HIDA.  Monitor for now.

## 2011-07-04 ENCOUNTER — Telehealth: Payer: Self-pay | Admitting: Family

## 2011-07-04 MED ORDER — SIMVASTATIN 20 MG PO TABS: 20.0000 mg | ORAL_TABLET | Freq: Every day | ORAL | Status: DC

## 2011-07-04 NOTE — Telephone Encounter (Signed)
Please call pt and let her know that her cholesterol is much higher than it was last year. Was 222 now up to 292.  I would recommend that she start simvastatin.  Come fasting to her apt in march and we will repeat her blood work at that time. She should let us know if she has any unusual muscle pain on simvastatin.

## 2011-07-06 NOTE — Telephone Encounter (Signed)
Left message on machine to return my call. 

## 2011-07-07 MED ORDER — SIMVASTATIN 20 MG PO TABS: 20.0000 mg | ORAL_TABLET | Freq: Every day | ORAL | Status: DC

## 2011-07-07 NOTE — Telephone Encounter (Signed)
Pt returned my call and was notified of results. Pt requests that copy of labs be mailed to her; copy mailed. She also requests that [facility] as [facility] will be [facility] than doing 30 day supply. Rx resent.

## 2011-07-25 ENCOUNTER — Telehealth: Payer: Self-pay | Admitting: Family

## 2011-07-25 MED ORDER — FLUOXETINE HCL 20 MG PO CAPS: 20.0000 mg | ORAL_CAPSULE | Freq: Every day | ORAL | Status: DC

## 2011-07-25 NOTE — Telephone Encounter (Signed)
Refill- fluoxetine hcl caps. 20mg . Qty 90

## 2011-07-25 NOTE — Telephone Encounter (Signed)
Refill sent to [facility]. 

## 2011-08-02 ENCOUNTER — Encounter: Payer: Self-pay | Admitting: Family

## 2011-08-02 MED ORDER — SIMVASTATIN 20 MG PO TABS: 20.0000 mg | ORAL_TABLET | Freq: Every day | ORAL | Status: DC

## 2011-08-02 NOTE — Telephone Encounter (Signed)
Sp[facility] to [place] [facility], he states they never received 90 day supply of simvastatin in January. Pt [facility]  90 day supply sent to [facility] via eRx.

## 2011-09-23 ENCOUNTER — Encounter: Payer: Self-pay | Admitting: Family

## 2011-09-23 ENCOUNTER — Ambulatory Visit (INDEPENDENT_AMBULATORY_CARE_PROVIDER_SITE_OTHER): Payer: 59 | Admitting: Family

## 2011-09-23 VITALS — BP 98/70 | HR 68 | Temp 97.7°F | Resp 16 | Ht 63.0 in | Wt 158.1 lb

## 2011-09-23 DIAGNOSIS — R202 Paresthesia of skin: Secondary | ICD-10-CM | POA: Insufficient documentation

## 2011-09-23 DIAGNOSIS — D649 Anemia, unspecified: Secondary | ICD-10-CM

## 2011-09-23 DIAGNOSIS — R209 Unspecified disturbances of skin sensation: Secondary | ICD-10-CM

## 2011-09-23 DIAGNOSIS — E785 Hyperlipidemia, unspecified: Secondary | ICD-10-CM

## 2011-09-23 DIAGNOSIS — M545 Low back pain, unspecified: Secondary | ICD-10-CM

## 2011-09-23 DIAGNOSIS — F329 Major depressive disorder, single episode, unspecified: Secondary | ICD-10-CM

## 2011-09-23 MED ORDER — ZOLPIDEM TARTRATE 10 MG PO TABS: ORAL_TABLET | ORAL | Status: DC

## 2011-09-23 NOTE — Progress Notes (Signed)
Subjective:    Patient ID: Haley Fuentes, female    DOB: [birth year], 51 y.o.   MRN: [id number]  HPI  Haley Fuentes is a 51 yr old female who presents today for follow up.  1) Depression- mood is OK, lots of stress.    2) Hyperlipidemia- lipids were up last visit.  Simvastatin was resumed. Denies myalgias. Fasting.   3) Low back pain- had SI injection early feb by Dr. Ethelene Hal- she is ow seeing a rolfer.    4) Notes dry mouth- lip tingling for a few weeks.  Denies cold sores, comes/goes.  Worse after she eats or brushes her teeth.      Review of Systems    see HPI  Past Medical History  Diagnosis Date  . Hemorrhoids   . Gastric polyp   . Depression   . Anemia     hx of iron deficient anemia  . Joint pain   . Postmenopausal     History   Social History  . Marital Status: Married    Spouse Name: N/A    Number of Children: 2  . Years of Education: N/A   Occupational History  . [employer]    Social History Main Topics  . Smoking status: Former Smoker    Types: Cigarettes    Quit date: 07/04/1985  . Smokeless tobacco: Never Used  . Alcohol Use: Yes     weekends  . Drug Use: No  . Sexually Active: Not on file   Other Topics Concern  . Not on file   Social History Narrative   Regular exercise: 30 minutes 5 days a week    Past Surgical History  Procedure Date  . Endometrial ablation 2007    Novasure  . Tubal ligation 1983  . Tubal ligation     Family History  Problem Relation Age of Onset  . Diabetes Mother   . Heart disease Mother   . Cancer Mother 1    breast cancer  . Stroke Father   . Diabetes Father   . Heart disease Father     CABG, AVR, A-Fib  . Coronary artery disease Sister     Allergies  Allergen Reactions  . Hydrocodone-Acetaminophen   . Omni-Pac     vomitting    Current Outpatient Prescriptions on File Prior to Visit  Medication Sig Dispense Refill  . estradiol (ESTRACE) 0.5 MG tablet Take 0.5 mg by mouth daily.        Marland Kitchen  FLUoxetine (PROZAC) 20 MG capsule Take 1 capsule (20 mg total) by mouth daily.  90 capsule  0  . mometasone (NASONEX) 50 MCG/ACT nasal spray Place 2 sprays into the nose daily.  17 g  2  . Multiple Vitamins-Minerals (MULTIVITAMIN WITH MINERALS) tablet 1 tablet.        Marland Kitchen omeprazole (PRILOSEC) 40 MG capsule Take 1 capsule (40 mg total) by mouth 2 (two) times daily.  60 capsule  2  . Progesterone Micronized (PROMETRIUM PO) Take by mouth.        . simvastatin (ZOCOR) 20 MG tablet Take 1 tablet (20 mg total) by mouth at bedtime.  90 tablet  0  . sodium bicarbonate 650 MG tablet Two tablets by mouth twice daily  120 tablet  2  . sucralfate (CARAFATE) 1 GM/10ML suspension Take 10 mLs (1 g total) by mouth 3 (three) times daily.  420 mL  2    BP 98/70  Pulse 68  Temp(Src) 97.7 F (36.5 C) (  Oral)  Resp 16  Ht 5\' 3"  (1.6 m)  Wt 158 lb 1.3 oz (71.705 kg)  BMI 28.00 kg/m2  SpO2 99%    Objective:   Physical Exam  Constitutional: She appears well-developed and well-nourished. No distress.  HENT:  Right Ear: Tympanic membrane and ear canal normal.  Left Ear: Tympanic membrane and ear canal normal.  Mouth/Throat: No posterior oropharyngeal edema or posterior oropharyngeal erythema.  Eyes: Conjunctivae are normal.  Cardiovascular: Normal rate and regular rhythm.   No murmur heard. Pulmonary/Chest: Effort normal and breath sounds normal. No respiratory distress. She has no wheezes. She has no rales. She exhibits no tenderness.  Musculoskeletal: She exhibits no edema.  Psychiatric: She has a normal mood and affect. Her behavior is normal. Judgment and thought content normal.          Assessment & Plan:

## 2011-09-23 NOTE — Patient Instructions (Signed)
Please complete your blood work prior to leaving.  Follow up in 3 months. 

## 2011-09-23 NOTE — Assessment & Plan Note (Signed)
Improving after SI injection with Dr. Ethelene Hal.

## 2011-09-23 NOTE — Assessment & Plan Note (Signed)
Pt with tingling sensation tongue, lips, intermittent.  Obtain B12, folate due to anemia.

## 2011-09-23 NOTE — Assessment & Plan Note (Signed)
Stable on fluoxetine, continue same.  

## 2011-09-23 NOTE — Assessment & Plan Note (Signed)
Tolerating statin, check FLP and hepatic function.

## 2011-09-24 LAB — HEPATIC FUNCTION PANEL
AST: 13 U/L (ref 0–37)
Alkaline Phosphatase: 73 U/L (ref 39–117)
Bilirubin, Direct: 0.1 mg/dL (ref 0.0–0.3)
Total Bilirubin: 0.6 mg/dL (ref 0.3–1.2)

## 2011-09-24 LAB — FOLATE: Folate: 18.5 ng/mL

## 2011-09-24 LAB — CBC WITH DIFFERENTIAL/PLATELET
Basophils Absolute: 0 10*3/uL (ref 0.0–0.1)
Basophils Relative: 0 % (ref 0–1)
Eosinophils Relative: 2 % (ref 0–5)
HCT: 36.1 % (ref 36.0–46.0)
Hemoglobin: 11.5 g/dL — ABNORMAL LOW (ref 12.0–15.0)
MCH: 30.3 pg (ref 26.0–34.0)
MCHC: 31.9 g/dL (ref 30.0–36.0)
MCV: 95.3 fL (ref 78.0–100.0)
Monocytes Absolute: 0.4 10*3/uL (ref 0.1–1.0)
Monocytes Relative: 6 % (ref 3–12)
Neutro Abs: 4 10*3/uL (ref 1.7–7.7)
RDW: 12.8 % (ref 11.5–15.5)

## 2011-09-24 LAB — LIPID PANEL
Cholesterol: 249 mg/dL — ABNORMAL HIGH (ref 0–200)
HDL: 54 mg/dL (ref >39)
LDL Cholesterol: 167 mg/dL — ABNORMAL HIGH (ref 0–99)
Total CHOL/HDL Ratio: 4.6 Ratio
Triglycerides: 142 mg/dL (ref <150)
VLDL: 28 mg/dL (ref 0–40)

## 2011-09-24 LAB — IRON AND TIBC: UIBC: 245 ug/dL (ref 125–400)

## 2011-09-26 ENCOUNTER — Telehealth: Payer: Self-pay | Admitting: Family

## 2011-09-26 MED ORDER — SIMVASTATIN 40 MG PO TABS: 40.0000 mg | ORAL_TABLET | Freq: Every day | ORAL | Status: DC

## 2011-09-26 NOTE — Telephone Encounter (Signed)
Notified pt, she will come fasting to appt in June. She requests that we send new rx of 40mg  strength to [facility] order. Rx sent to [facility]

## 2011-09-26 NOTE — Telephone Encounter (Signed)
Please call pt and let her know that her cholesterol is improving but still high.  I would like for her to increase her simvastatin to 40mg  once daily. Plan for lft/flp in 3 months (hyperlipidemia). She is mildly anemic, but iron, b12 and folate are all normal.

## 2011-10-21 ENCOUNTER — Encounter: Payer: Self-pay | Admitting: Family

## 2011-12-23 ENCOUNTER — Ambulatory Visit: Payer: 59 | Admitting: Family

## 2012-01-03 ENCOUNTER — Other Ambulatory Visit: Payer: Self-pay | Admitting: Family

## 2012-01-03 NOTE — Telephone Encounter (Signed)
Refill-fluoxetine 20mg  cap. Take one capsule by mouth daily. Qty 90 last fill 4.30.11

## 2012-01-03 NOTE — Telephone Encounter (Signed)
Last refill sent to [facility] on 07/25/11 #90 x no refills. Left message for pt to return my call. 1) Need to verify pharmacy to send refill to.  2) Needs to r/s f/u that was due in June.

## 2012-01-04 NOTE — Telephone Encounter (Signed)
Attempted to reach pt, left message on home # to return my call.

## 2012-01-30 NOTE — Telephone Encounter (Signed)
Left message on home # for pt to return my call. Looks like last refill that was sent for pt was back in January for a 3 month supply. Appears pt has not been taking medication regularly and has not returned my previous calls. Medication will be denied if pt does not call me back today.

## 2012-02-02 ENCOUNTER — Telehealth: Payer: Self-pay | Admitting: Family

## 2012-02-02 MED ORDER — SIMVASTATIN 40 MG PO TABS: 40.0000 mg | ORAL_TABLET | Freq: Every day | ORAL | Status: DC

## 2012-02-02 NOTE — Telephone Encounter (Signed)
14 day supply of simvastatin sent to [facility] as pt is past due for follow up. Pt has not returned my previous calls re: paxil refill from 01/03/12. Pt needs fasting f/u. Attempted to reach pt and left message on voicemail to return my call. Mailed contact letter to patient to verify telephone, pharmacy and scheduled fasting f/u.

## 2012-02-07 ENCOUNTER — Telehealth: Payer: Self-pay | Admitting: *Deleted

## 2012-02-07 NOTE — Telephone Encounter (Signed)
Received message from pt stating she received my letter. My letter incorrectly stated that we had prescribed paxil. It should have stated prozac. Pt confirmed that she takes prozac every day and has been taking some that was prescribed by her previous doctor as our last refill was given in January for a 3 month supply. Pt scheduled fasting follow up for 02/17/12 at 8:30am.

## 2012-02-10 NOTE — Telephone Encounter (Signed)
See 02/07/12 phone note.

## 2012-02-17 ENCOUNTER — Ambulatory Visit (INDEPENDENT_AMBULATORY_CARE_PROVIDER_SITE_OTHER): Payer: 59 | Admitting: Family

## 2012-02-17 ENCOUNTER — Encounter: Payer: Self-pay | Admitting: Family

## 2012-02-17 VITALS — BP 96/70 | HR 73 | Temp 98.0°F | Resp 16 | Ht 63.0 in | Wt 163.0 lb

## 2012-02-17 DIAGNOSIS — R5383 Other fatigue: Secondary | ICD-10-CM

## 2012-02-17 DIAGNOSIS — E785 Hyperlipidemia, unspecified: Secondary | ICD-10-CM

## 2012-02-17 DIAGNOSIS — J309 Allergic rhinitis, unspecified: Secondary | ICD-10-CM | POA: Insufficient documentation

## 2012-02-17 DIAGNOSIS — R635 Abnormal weight gain: Secondary | ICD-10-CM

## 2012-02-17 DIAGNOSIS — F329 Major depressive disorder, single episode, unspecified: Secondary | ICD-10-CM

## 2012-02-17 DIAGNOSIS — R5381 Other malaise: Secondary | ICD-10-CM

## 2012-02-17 LAB — LIPID PANEL: HDL: 50 mg/dL (ref >39)

## 2012-02-17 LAB — HEPATIC FUNCTION PANEL
AST: 20 U/L (ref 0–37)
Albumin: 4.5 g/dL (ref 3.5–5.2)
Alkaline Phosphatase: 62 U/L (ref 39–117)
Indirect Bilirubin: 0.4 mg/dL (ref 0.0–0.9)
Total Bilirubin: 0.5 mg/dL (ref 0.3–1.2)

## 2012-02-17 LAB — TSH: TSH: 2.167 u[IU]/mL (ref 0.350–4.500)

## 2012-02-17 MED ORDER — ZOLPIDEM TARTRATE 10 MG PO TABS: ORAL_TABLET | ORAL | Status: DC

## 2012-02-17 MED ORDER — CYCLOBENZAPRINE HCL 10 MG PO TABS: 10.0000 mg | ORAL_TABLET | Freq: Two times a day (BID) | ORAL | Status: DC | PRN

## 2012-02-17 MED ORDER — SIMVASTATIN 40 MG PO TABS: 40.0000 mg | ORAL_TABLET | Freq: Every day | ORAL | Status: DC

## 2012-02-17 MED ORDER — FLUTICASONE PROPIONATE 50 MCG/ACT NA SUSP: 2.0000 | Freq: Every day | NASAL | Status: DC

## 2012-02-17 NOTE — Assessment & Plan Note (Signed)
Obtain TSH.  Continue diet/exercise.

## 2012-02-17 NOTE — Assessment & Plan Note (Signed)
We discussed adding another medication, but she declines at this time.  Wants to give it some time.

## 2012-02-17 NOTE — Patient Instructions (Addendum)
Please complete your lab work prior to leaving. Follow up in 6 months.  

## 2012-02-17 NOTE — Progress Notes (Signed)
Subjective:    Patient ID: Haley Fuentes, female    DOB: Aug 26, 1960, 51 y.o.   MRN: [id number]  HPI  [name] is a 51 yr old female who presents today for follow up.  1) Depression-  She is maintained on prozac. Not feeling motivated.  Trouble getting out of bed. She has been very busy at work.  Lots of stress.   2) Hyperlipidemia-continues simvastatin.  3) Nasal congestion-has been very congested- maxillary pressure- most of the summer.  This week has been dry in her nasal passage.  + dry cough.  She reports nasonex ran out was too expensive.  Though that helped.    4) Weight gain- tries to exercise- but limited by joint pain.  Diet is OK.  Smoothie in AM- fresh blueberries, banana, greek yogurt, peanut butter.  Lunch- 1/2 sand or salad or soup.    Review of Systems See HPI  Past Medical History  Diagnosis Date  . Hemorrhoids   . Gastric polyp   . Depression   . Anemia     hx of iron deficient anemia  . Joint pain   . Postmenopausal     History   Social History  . Marital Status: Married    Spouse Name: N/A    Number of Children: 2  . Years of Education: N/A   Occupational History  . [employer]    Social History Main Topics  . Smoking status: Former Smoker    Types: Cigarettes    Quit date: 07/04/1985  . Smokeless tobacco: Never Used  . Alcohol Use: Yes     weekends  . Drug Use: No  . Sexually Active: Not on file   Other Topics Concern  . Not on file   Social History Narrative   Regular exercise: 30 minutes 5 days a week    Past Surgical History  Procedure Date  . Endometrial ablation 2007    Novasure  . Tubal ligation 1983  . Tubal ligation     Family History  Problem Relation Age of Onset  . Diabetes Mother   . Heart disease Mother   . Cancer Mother 109    breast cancer  . Stroke Father   . Diabetes Father   . Heart disease Father     CABG, AVR, A-Fib  . Coronary artery disease Sister     Allergies  Allergen Reactions  .  Hydrocodone-Acetaminophen   . Omni-Pac     vomitting    Current Outpatient Prescriptions on File Prior to Visit  Medication Sig Dispense Refill  . estradiol (ESTRACE) 0.5 MG tablet Take 0.5 mg by mouth daily.        Marland Kitchen FLUoxetine (PROZAC) 20 MG capsule Take 1 capsule (20 mg total) by mouth daily.  90 capsule  0  . medroxyPROGESTERone (PROVERA) 2.5 MG tablet Take 1 tablet by mouth daily.      . Multiple Vitamins-Minerals (MULTIVITAMIN WITH MINERALS) tablet 1 tablet.        Marland Kitchen DISCONTD: omeprazole (PRILOSEC) 40 MG capsule Take 1 capsule (40 mg total) by mouth 2 (two) times daily.  60 capsule  2  . DISCONTD: simvastatin (ZOCOR) 40 MG tablet Take 1 tablet (40 mg total) by mouth at bedtime.  14 tablet  0  . DISCONTD: zolpidem (AMBIEN) 10 MG tablet 1/2 to 1 tab po qhs prn sleep  30 tablet  0  . fluticasone (FLONASE) 50 MCG/ACT nasal spray Place 2 sprays into the nose daily.  16 g  6    BP 96/70  Pulse 73  Temp 98 F (36.7 C) (Oral)  Resp 16  Ht 5\' 3"  (1.6 m)  Wt 163 lb (73.936 kg)  BMI 28.87 kg/m2  SpO2 98%       Objective:   Physical Exam  Constitutional: She appears well-developed and well-nourished. No distress.  HENT:  Head: Normocephalic and atraumatic.  Right Ear: Tympanic membrane and ear canal normal.  Left Ear: Tympanic membrane and ear canal normal.  Mouth/Throat: No posterior oropharyngeal edema or posterior oropharyngeal erythema.  Cardiovascular: Normal rate and regular rhythm.   No murmur heard. Pulmonary/Chest: Effort normal and breath sounds normal. No respiratory distress. She has no wheezes. She has no rales. She exhibits no tenderness.  Psychiatric: She has a normal mood and affect. Her behavior is normal. Judgment and thought content normal.          Assessment & Plan:

## 2012-02-17 NOTE — Assessment & Plan Note (Signed)
Trial of flonase, should be cheaper for pt than nasonex.

## 2012-02-17 NOTE — Assessment & Plan Note (Signed)
Tolerating statin. Obtain FLP.   

## 2012-02-21 ENCOUNTER — Telehealth: Payer: Self-pay | Admitting: Family

## 2012-02-21 MED ORDER — ATORVASTATIN CALCIUM 40 MG PO TABS: 40.0000 mg | ORAL_TABLET | Freq: Every day | ORAL | Status: DC

## 2012-02-21 NOTE — Telephone Encounter (Signed)
Pls call pt and let her know that cholesterol is still above goal. Total cholesterol 233 down from 249.  I would like to switch her from simvastatin to generic lipitor which is stronger.  Repeat flp/lft in 3 months (hyperlipidemia).

## 2012-02-21 NOTE — Telephone Encounter (Signed)
Notified pt with md response rx already sent to [facility]... 02/21/12@9 :09am/LMB

## 2012-04-26 ENCOUNTER — Encounter: Payer: Self-pay | Admitting: Family

## 2012-04-26 DIAGNOSIS — E785 Hyperlipidemia, unspecified: Secondary | ICD-10-CM

## 2012-06-06 LAB — HEPATIC FUNCTION PANEL
ALT: 22 U/L (ref 0–35)
AST: 18 U/L (ref 0–37)
Albumin: 4.4 g/dL (ref 3.5–5.2)
Alkaline Phosphatase: 67 U/L (ref 39–117)
Indirect Bilirubin: 0.4 mg/dL (ref 0.0–0.9)
Total Protein: 6.5 g/dL (ref 6.0–8.3)

## 2012-06-06 LAB — LIPID PANEL
LDL Cholesterol: 111 mg/dL — ABNORMAL HIGH (ref 0–99)
Triglycerides: 147 mg/dL (ref <150)
VLDL: 29 mg/dL (ref 0–40)

## 2012-06-06 NOTE — Addendum Note (Signed)
Addended by: Mervin Kung A on: 06/06/2012 09:49 AM   Modules accepted: Orders

## 2012-06-06 NOTE — Telephone Encounter (Signed)
Pt presented to the lab, future orders released. 

## 2012-06-08 ENCOUNTER — Telehealth: Payer: Self-pay | Admitting: Family

## 2012-06-08 MED ORDER — ZOLPIDEM TARTRATE 10 MG PO TABS: ORAL_TABLET | ORAL | Status: DC

## 2012-06-08 NOTE — Telephone Encounter (Signed)
Refill-zolpidem tartrate 10mg  tablet. Take 1/2-1 tablet by mouth at bedtime as needed for sleep. Qty 30 last fill 8.21.13

## 2012-06-08 NOTE — Telephone Encounter (Signed)
Refill left on pharmacy voicemail. 

## 2012-07-18 ENCOUNTER — Other Ambulatory Visit: Payer: Self-pay | Admitting: Family

## 2012-07-18 NOTE — Telephone Encounter (Signed)
Rx to pharmacy; pt overdue for f/u OV; LMOM to inform pt & remind OV needs to be scheduled/SLS

## 2012-08-18 ENCOUNTER — Other Ambulatory Visit: Payer: Self-pay

## 2012-10-26 ENCOUNTER — Other Ambulatory Visit: Payer: Self-pay | Admitting: Obstetrics and Gynecology

## 2012-12-26 ENCOUNTER — Other Ambulatory Visit: Payer: Self-pay | Admitting: Family

## 2012-12-27 NOTE — Telephone Encounter (Signed)
2 week supply of atorvastatin sent to pharmacy. Pt is past due for follow up. Please call pt to arrange appt as she will need to be seen before further refills can be given.

## 2012-12-28 NOTE — Telephone Encounter (Signed)
Informed patient of medication refill and she scheduled appointment for 01/14/13

## 2013-01-14 ENCOUNTER — Encounter: Payer: Self-pay | Admitting: Family

## 2013-01-14 ENCOUNTER — Ambulatory Visit (INDEPENDENT_AMBULATORY_CARE_PROVIDER_SITE_OTHER): Payer: Managed Care, Other (non HMO) | Admitting: Family

## 2013-01-14 VITALS — BP 104/70 | HR 71 | Temp 97.9°F | Resp 16 | Ht 63.0 in | Wt 165.1 lb

## 2013-01-14 DIAGNOSIS — E785 Hyperlipidemia, unspecified: Secondary | ICD-10-CM

## 2013-01-14 DIAGNOSIS — K219 Gastro-esophageal reflux disease without esophagitis: Secondary | ICD-10-CM

## 2013-01-14 DIAGNOSIS — F329 Major depressive disorder, single episode, unspecified: Secondary | ICD-10-CM

## 2013-01-14 LAB — HEPATIC FUNCTION PANEL
Indirect Bilirubin: 0.5 mg/dL (ref 0.0–0.9)
Total Bilirubin: 0.6 mg/dL (ref 0.3–1.2)
Total Protein: 6.6 g/dL (ref 6.0–8.3)

## 2013-01-14 LAB — LIPID PANEL
HDL: 45 mg/dL (ref >39)
LDL Cholesterol: 115 mg/dL — ABNORMAL HIGH (ref 0–99)
Total CHOL/HDL Ratio: 4.3 Ratio
Triglycerides: 173 mg/dL — ABNORMAL HIGH (ref <150)

## 2013-01-14 MED ORDER — ATORVASTATIN CALCIUM 40 MG PO TABS: 40.0000 mg | ORAL_TABLET | Freq: Every day | ORAL | Status: DC

## 2013-01-14 NOTE — Patient Instructions (Addendum)
Please complete your lab work prior to leaving. Follow up in 6 months.  

## 2013-01-14 NOTE — Progress Notes (Signed)
Subjective:    Patient ID: Haley Fuentes, female    DOB: [birth year], 52 y.o.   MRN: [id number]  HPI  Haley Fuentes is a 52 yr old female who presents today for follow up.  Depression- she continues prozac, feels like her depression is well controlled.  Doing better overall.  GERD- continues prilosec. Reports that this is well controlled.  Hyperlipidemia- continues lipitor. Reports that she is tolerating lipitor without difficulty.  Review of Systems See HPI  Past Medical History  Diagnosis Date  . Hemorrhoids   . Gastric polyp   . Depression   . Anemia     hx of iron deficient anemia  . Joint pain   . Postmenopausal     History   Social History  . Marital Status: Married    Spouse Name: N/A    Number of Children: 2  . Years of Education: N/A   Occupational History  . [employer]    Social History Main Topics  . Smoking status: Former Smoker    Types: Cigarettes    Quit date: 07/04/1985  . Smokeless tobacco: Never Used  . Alcohol Use: Yes     Comment: weekends  . Drug Use: No  . Sexually Active: Not on file   Other Topics Concern  . Not on file   Social History Narrative   Regular exercise: 30 minutes 5 days a week    Past Surgical History  Procedure Laterality Date  . Endometrial ablation  2007    Novasure  . Tubal ligation  1983  . Tubal ligation      Family History  Problem Relation Age of Onset  . Diabetes Mother   . Heart disease Mother   . Cancer Mother 98    breast cancer  . Stroke Father   . Diabetes Father   . Heart disease Father     CABG, AVR, A-Fib  . Coronary artery disease Sister     Allergies  Allergen Reactions  . Hydrocodone-Acetaminophen   . Omni-Pac     vomitting    Current Outpatient Prescriptions on File Prior to Visit  Medication Sig Dispense Refill  . cyclobenzaprine (FLEXERIL) 10 MG tablet Take 1 tablet (10 mg total) by mouth 2 (two) times daily as needed.  30 tablet  0  . estradiol (ESTRACE) 0.5 MG tablet Take  0.5 mg by mouth daily.        Marland Kitchen FLUoxetine (PROZAC) 20 MG capsule Take 1 capsule (20 mg total) by mouth daily.  90 capsule  0  . medroxyPROGESTERone (PROVERA) 2.5 MG tablet Take 1 tablet by mouth daily.      . Multiple Vitamins-Minerals (MULTIVITAMIN WITH MINERALS) tablet 1 tablet.        Marland Kitchen omeprazole (PRILOSEC) 40 MG capsule Take 40 mg by mouth 2 (two) times daily as needed.      . sodium bicarbonate 650 MG tablet Two tablets by mouth twice daily as needed.      . zolpidem (AMBIEN) 10 MG tablet 1/2 to 1 tab po qhs prn sleep  30 tablet  0   No current facility-administered medications on file prior to visit.    BP 104/70  Pulse 71  Temp(Src) 97.9 F (36.6 C) (Oral)  Resp 16  Ht 5\' 3"  (1.6 m)  Wt 165 lb 1.3 oz (74.88 kg)  BMI 29.25 kg/m2  SpO2 99%       Objective:   Physical Exam  Constitutional: She is oriented to person, place, and time.  She appears well-developed and well-nourished. No distress.  HENT:  Head: Normocephalic and atraumatic.  Cardiovascular: Normal rate and regular rhythm.   No murmur heard. Pulmonary/Chest: Effort normal and breath sounds normal. No respiratory distress. She has no wheezes. She has no rales. She exhibits no tenderness.  Neurological: She is alert and oriented to person, place, and time.  Psychiatric: She has a normal mood and affect. Her behavior is normal. Judgment and thought content normal.          Assessment & Plan:

## 2013-01-14 NOTE — Assessment & Plan Note (Signed)
Stable on prozac, continue same. 

## 2013-01-14 NOTE — Assessment & Plan Note (Signed)
Stable on PPI 

## 2013-01-14 NOTE — Assessment & Plan Note (Signed)
Tolerating statin.  Check FLP/LFT.

## 2013-01-15 ENCOUNTER — Encounter: Payer: Self-pay | Admitting: Family

## 2013-01-17 ENCOUNTER — Telehealth: Payer: Self-pay | Admitting: Family

## 2013-01-17 ENCOUNTER — Other Ambulatory Visit: Payer: Self-pay

## 2013-01-17 ENCOUNTER — Other Ambulatory Visit: Payer: Self-pay | Admitting: Family

## 2013-01-17 MED ORDER — ZOLPIDEM TARTRATE 10 MG PO TABS: ORAL_TABLET | ORAL | Status: DC

## 2013-01-17 MED ORDER — CYCLOBENZAPRINE HCL 10 MG PO TABS: 10.0000 mg | ORAL_TABLET | Freq: Two times a day (BID) | ORAL | Status: DC | PRN

## 2013-01-17 NOTE — Telephone Encounter (Signed)
Refill- cyclobenzaprine 10mg  tablet. Take one tablet three times daily as needed. Qty 30 last fill 3.24.2009  Refill- zolpidem tartrate 10mg  tablet. Take 1/2 to 1 tablet by mouth at bedtime as needed for sleep. Qty 30 last fill 12.6.13

## 2013-02-11 ENCOUNTER — Telehealth: Payer: Self-pay | Admitting: *Deleted

## 2013-02-11 MED ORDER — FLUOXETINE HCL 20 MG PO CAPS: 20.0000 mg | ORAL_CAPSULE | Freq: Every day | ORAL | Status: DC

## 2013-02-11 NOTE — Telephone Encounter (Signed)
Received message from pt requesting refill of fluox[facility] to [facility] and call when ready.  Refill sent via eRx.

## 2013-04-30 ENCOUNTER — Other Ambulatory Visit: Payer: Self-pay | Admitting: Family

## 2013-04-30 NOTE — Telephone Encounter (Signed)
Informed patient of medication refill and she says that she will have to call back to schedule.

## 2013-04-30 NOTE — Telephone Encounter (Signed)
Atorvastatin refill sent to pharmacy. Pt is due for follow up in January. Please call pt to arrange.

## 2013-05-09 ENCOUNTER — Other Ambulatory Visit: Payer: Self-pay

## 2013-05-28 ENCOUNTER — Encounter: Payer: Self-pay | Admitting: Family

## 2013-05-28 ENCOUNTER — Ambulatory Visit (INDEPENDENT_AMBULATORY_CARE_PROVIDER_SITE_OTHER): Payer: Managed Care, Other (non HMO) | Admitting: Family

## 2013-05-28 ENCOUNTER — Telehealth: Payer: Self-pay | Admitting: *Deleted

## 2013-05-28 VITALS — BP 130/88 | HR 61 | Temp 98.1°F | Resp 16 | Ht 63.0 in | Wt 165.0 lb

## 2013-05-28 DIAGNOSIS — B029 Zoster without complications: Secondary | ICD-10-CM

## 2013-05-28 MED ORDER — OMEPRAZOLE 40 MG PO CPDR: 40.0000 mg | DELAYED_RELEASE_CAPSULE | Freq: Two times a day (BID) | ORAL | Status: DC | PRN

## 2013-05-28 MED ORDER — LIDOCAINE 5 % EX PTCH: 1.0000 | MEDICATED_PATCH | CUTANEOUS | Status: DC

## 2013-05-28 MED ORDER — CYCLOBENZAPRINE HCL 10 MG PO TABS: 10.0000 mg | ORAL_TABLET | Freq: Two times a day (BID) | ORAL | Status: DC | PRN

## 2013-05-28 NOTE — Telephone Encounter (Signed)
I would recommend lidoderm patch to affected area as needed for pain- rx sent to the pharmacy.  Double R arm weakness related to shingles.  Could have pinched nerve in her neck. Recommend aleve 220mg  twice daily x 1 week.  If symptoms not improved in 1 week, should schedule OV to further evaluate please.

## 2013-05-28 NOTE — Assessment & Plan Note (Signed)
Outside of window for anti viral rx.  Not having significant pain.  Advised pt to keep rash covered and to call if symptoms worsen or if rash does not continue to improve.  Pt verbalizes understanding.

## 2013-05-28 NOTE — Patient Instructions (Signed)
Please call if symptoms worsen or if symptoms do not continue to improve in the next week.  Shingles Shingles (herpes zoster) is an infection that is caused by the same virus that causes chickenpox (varicella). The infection causes a painful skin rash and fluid-filled blisters, which eventually break open, crust over, and heal. It may occur in any area of the body, but it usually affects only one side of the body or face. The pain of shingles usually lasts about 1 month. However, some people with shingles may develop long-term (chronic) pain in the affected area of the body. Shingles often occurs many years after the person had chickenpox. It is more common:  In people older than 50 years.  In people with weakened immune systems, such as those with HIV, AIDS, or cancer.  In people taking medicines that weaken the immune system, such as transplant medicines.  In people under great stress. CAUSES  Shingles is caused by the varicella zoster virus (VZV), which also causes chickenpox. After a person is infected with the virus, it can remain in the person's body for years in an inactive state (dormant). To cause shingles, the virus reactivates and breaks out as an infection in a nerve root. The virus can be spread from person to person (contagious) through contact with open blisters of the shingles rash. It will only spread to people who have not had chickenpox. When these people are exposed to the virus, they may develop chickenpox. They will not develop shingles. Once the blisters scab over, the person is no longer contagious and cannot spread the virus to others. SYMPTOMS  Shingles shows up in stages. The initial symptoms may be pain, itching, and tingling in an area of the skin. This pain is usually described as burning, stabbing, or throbbing.In a few days or weeks, a painful red rash will appear in the area where the pain, itching, and tingling were felt. The rash is usually on one side of the body  in a band or belt-like pattern. Then, the rash usually turns into fluid-filled blisters. They will scab over and dry up in approximately 2 3 weeks. Flu-like symptoms may also occur with the initial symptoms, the rash, or the blisters. These may include:  Fever.  Chills.  Headache.  Upset stomach. DIAGNOSIS  Your caregiver will perform a skin exam to diagnose shingles. Skin scrapings or fluid samples may also be taken from the blisters. This sample will be examined under a microscope or sent to a lab for further testing. TREATMENT  There is no specific cure for shingles. Your caregiver will likely prescribe medicines to help you manage the pain, recover faster, and avoid long-term problems. This may include antiviral drugs, anti-inflammatory drugs, and pain medicines. HOME CARE INSTRUCTIONS   Take a cool bath or apply cool compresses to the area of the rash or blisters as directed. This may help with the pain and itching.   Only take over-the-counter or prescription medicines as directed by your caregiver.   Rest as directed by your caregiver.  Keep your rash and blisters clean with mild soap and cool water or as directed by your caregiver.  Do not pick your blisters or scratch your rash. Apply an anti-itch cream or numbing creams to the affected area as directed by your caregiver.  Keep your shingles rash covered with a loose bandage (dressing).  Avoid skin contact with:  Babies.   Pregnant women.   Children with eczema.   Elderly people with transplants.  People with chronic illnesses, such as leukemia or AIDS.   Wear loose-fitting clothing to help ease the pain of material rubbing against the rash.  Keep all follow-up appointments with your caregiver.If the area involved is on your face, you may receive a referral for follow-up to a specialist, such as an eye doctor (ophthalmologist) or an ear, nose, and throat (ENT) doctor. Keeping all follow-up appointments  will help you avoid eye complications, chronic pain, or disability.  SEEK IMMEDIATE MEDICAL CARE IF:   You have facial pain, pain around the eye area, or loss of feeling on one side of your face.  You have ear pain or ringing in your ear.  You have loss of taste.  Your pain is not relieved with prescribed medicines.   Your redness or swelling spreads.   You have more pain and swelling.  Your condition is worsening or has changed.   You have a feveror persistent symptoms for more than 2 3 days.  You have a fever and your symptoms suddenly get worse. MAKE SURE YOU:  Understand these instructions.  Will watch your condition.  Will get help right away if you are not doing well or get worse. Document Released: 06/20/2005 Document Revised: 03/14/2012 Document Reviewed: 02/02/2012 [facility] Patient Information 2014 [place].

## 2013-05-28 NOTE — Telephone Encounter (Signed)
Received call from pt stating she changed her mind and would like to have a pain medication called to [facility] just in case she needs it.  Also said she forgot to tell you that she has been going to a [employer] for her right arm pain. States she was doing a lot of moving and lifting of boxes for her job and started having right arm pain from her forearm to her shoulder blade. Has also been having difficulty writing and picking up items with her right hand. Has been dropping objects and also notes some numbness of her arm.  Thinks she may have a pinched nerve. Wants to know if you think the shingles could be related? Please advise.

## 2013-05-28 NOTE — Progress Notes (Signed)
Subjective:    Patient ID: Haley Fuentes, female    DOB: 12/05/1960, 52 y.o.   MRN: [id number]  HPI  [name] is a 52 yr old female who presents today with chief complaint of rash. Rash is located on the right upper back and beneath the right arm.  Denies significant pain.   Review of Systems See HPI  Past Medical History  Diagnosis Date  . Hemorrhoids   . Gastric polyp   . Depression   . Anemia     hx of iron deficient anemia  . Joint pain   . Postmenopausal     History   Social History  . Marital Status: Married    Spouse Name: Haley Fuentes    Number of Children: 2  . Years of Education: Haley Fuentes   Occupational History  . [employer]    Social History Main Topics  . Smoking status: Former Smoker    Types: Cigarettes    Quit date: 07/04/1985  . Smokeless tobacco: Never Used  . Alcohol Use: Yes     Comment: weekends  . Drug Use: No  . Sexual Activity: Not on file   Other Topics Concern  . Not on file   Social History Narrative   Regular exercise: 30 minutes 5 days a week    Past Surgical History  Procedure Laterality Date  . Endometrial ablation  2007    Novasure  . Tubal ligation  1983  . Tubal ligation      Family History  Problem Relation Age of Onset  . Diabetes Mother   . Heart disease Mother   . Cancer Mother 52    breast cancer  . Stroke Father   . Diabetes Father   . Heart disease Father     CABG, AVR, A-Fib  . Coronary artery disease Sister     Allergies  Allergen Reactions  . Hydrocodone-Acetaminophen   . Omni-Pac     vomitting    Current Outpatient Prescriptions on File Prior to Visit  Medication Sig Dispense Refill  . atorvastatin (LIPITOR) 40 MG tablet TAKE 1 TABLET (40 MG TOTAL) BY MOUTH DAILY.  90 tablet  1  . cyclobenzaprine (FLEXERIL) 10 MG tablet Take 1 tablet (10 mg total) by mouth 2 (two) times daily as needed.  30 tablet  0  . estradiol (ESTRACE) 0.5 MG tablet Take 0.5 mg by mouth daily.        Marland Kitchen FLUoxetine (PROZAC) 20 MG  capsule Take 1 capsule (20 mg total) by mouth daily.  90 capsule  1  . medroxyPROGESTERone (PROVERA) 2.5 MG tablet Take 1 tablet by mouth daily.      . Multiple Vitamins-Minerals (MULTIVITAMIN WITH MINERALS) tablet 1 tablet.        Marland Kitchen omeprazole (PRILOSEC) 40 MG capsule Take 40 mg by mouth 2 (two) times daily as needed.      . zolpidem (AMBIEN) 10 MG tablet 1/2 to 1 tab po qhs prn sleep  30 tablet  0   No current facility-administered medications on file prior to visit.    BP 130/88  Pulse 61  Temp(Src) 98.1 F (36.7 C) (Oral)  Resp 16  Ht 5\' 3"  (1.6 m)  Wt 165 lb 0.6 oz (74.862 kg)  BMI 29.24 kg/m2  SpO2 99%       Objective:   Physical Exam  Constitutional: She is oriented to person, place, and time. She appears well-developed and well-nourished.  Neurological: She is alert and oriented to person, place, and  time.  Skin: Skin is warm and dry.     Erythematous blistered rash as noted  Psychiatric: She has a normal mood and affect. Her behavior is normal. Judgment and thought content normal.          Assessment & Plan:

## 2013-05-28 NOTE — Telephone Encounter (Signed)
Notified pt and she voices understanding. 

## 2013-06-03 ENCOUNTER — Telehealth: Payer: Self-pay | Admitting: *Deleted

## 2013-06-03 MED ORDER — ZOLPIDEM TARTRATE 10 MG PO TABS: ORAL_TABLET | ORAL | Status: DC

## 2013-06-03 NOTE — Telephone Encounter (Signed)
OK to send 30 tabs zero refills.  

## 2013-06-03 NOTE — Telephone Encounter (Signed)
eScribe request for refill on Zolpidem 10 mg Last filled - 07.17.14, #30x0 Last AEX - 07.14.14, Acute 11.25.14 Next AEX - 6 Months Please Advise/SLS

## 2013-06-03 NOTE — Telephone Encounter (Signed)
Rx called to pharmacy voicemail. 

## 2013-07-20 ENCOUNTER — Other Ambulatory Visit: Payer: Self-pay | Admitting: Family

## 2013-07-22 ENCOUNTER — Telehealth: Payer: Self-pay | Admitting: Family

## 2013-07-22 NOTE — Telephone Encounter (Signed)
OK to send 30 tabs with zero refills.  

## 2013-07-22 NOTE — Telephone Encounter (Signed)
Refill- zolpidem tartrate 10mg  tablet. Take 1/2 to 1 tablet by mouth at bedtime as needed for sleep. Qty 30 last fill 12.2.14

## 2013-07-23 MED ORDER — ZOLPIDEM TARTRATE 10 MG PO TABS: ORAL_TABLET | ORAL | Status: DC

## 2013-07-23 NOTE — Telephone Encounter (Signed)
Rx called to pharmacy voicemail as below. 

## 2013-10-08 ENCOUNTER — Encounter: Payer: Self-pay | Admitting: Family

## 2013-10-08 ENCOUNTER — Ambulatory Visit (HOSPITAL_BASED_OUTPATIENT_CLINIC_OR_DEPARTMENT_OTHER)
Admission: RE | Admit: 2013-10-08 | Discharge: 2013-10-08 | Disposition: A | Discharge status: Home / Self Care | Payer: 59 | Source: Ambulatory Visit | Attending: Family | Admitting: Family

## 2013-10-08 ENCOUNTER — Telehealth: Payer: Self-pay | Admitting: *Deleted

## 2013-10-08 ENCOUNTER — Ambulatory Visit (INDEPENDENT_AMBULATORY_CARE_PROVIDER_SITE_OTHER): Payer: 59 | Admitting: Family

## 2013-10-08 VITALS — BP 118/86 | HR 60 | Temp 97.9°F | Ht 63.0 in | Wt 164.1 lb

## 2013-10-08 DIAGNOSIS — E785 Hyperlipidemia, unspecified: Secondary | ICD-10-CM

## 2013-10-08 DIAGNOSIS — F329 Major depressive disorder, single episode, unspecified: Secondary | ICD-10-CM

## 2013-10-08 DIAGNOSIS — M542 Cervicalgia: Secondary | ICD-10-CM

## 2013-10-08 DIAGNOSIS — R5383 Other fatigue: Secondary | ICD-10-CM

## 2013-10-08 DIAGNOSIS — M5030 Other cervical disc degeneration, unspecified cervical region: Secondary | ICD-10-CM | POA: Insufficient documentation

## 2013-10-08 DIAGNOSIS — R5381 Other malaise: Secondary | ICD-10-CM

## 2013-10-08 DIAGNOSIS — G5600 Carpal tunnel syndrome, unspecified upper limb: Secondary | ICD-10-CM

## 2013-10-08 DIAGNOSIS — F3289 Other specified depressive episodes: Secondary | ICD-10-CM

## 2013-10-08 DIAGNOSIS — D649 Anemia, unspecified: Secondary | ICD-10-CM

## 2013-10-08 LAB — LIPID PANEL
CHOLESTEROL: 194 mg/dL (ref 0–200)
HDL: 52 mg/dL (ref >39)
LDL Cholesterol: 106 mg/dL — ABNORMAL HIGH (ref 0–99)
Total CHOL/HDL Ratio: 3.7 Ratio
Triglycerides: 181 mg/dL — ABNORMAL HIGH (ref <150)
VLDL: 36 mg/dL (ref 0–40)

## 2013-10-08 LAB — HEPATIC FUNCTION PANEL
ALK PHOS: 75 U/L (ref 39–117)
ALT: 22 U/L (ref 0–35)
AST: 18 U/L (ref 0–37)
Albumin: 4.4 g/dL (ref 3.5–5.2)
Bilirubin, Direct: 0.1 mg/dL (ref 0.0–0.3)
Indirect Bilirubin: 0.6 mg/dL (ref 0.2–1.2)
TOTAL PROTEIN: 6.8 g/dL (ref 6.0–8.3)
Total Bilirubin: 0.7 mg/dL (ref 0.2–1.2)

## 2013-10-08 LAB — CBC WITH DIFFERENTIAL/PLATELET
BASOS PCT: 0 % (ref 0–1)
Basophils Absolute: 0 10*3/uL (ref 0.0–0.1)
EOS ABS: 0.1 10*3/uL (ref 0.0–0.7)
Eosinophils Relative: 2 % (ref 0–5)
HCT: 34 % — ABNORMAL LOW (ref 36.0–46.0)
Hemoglobin: 12.4 g/dL (ref 12.0–15.0)
Lymphocytes Relative: 32 % (ref 12–46)
Lymphs Abs: 2.3 10*3/uL (ref 0.7–4.0)
MCH: 31.1 pg (ref 26.0–34.0)
MCHC: 34.8 g/dL (ref 30.0–36.0)
MCV: 86.1 fL (ref 78.0–100.0)
MONO ABS: 0.4 10*3/uL (ref 0.1–1.0)
MONOS PCT: 5 % (ref 3–12)
NEUTROS PCT: 61 % (ref 43–77)
Neutro Abs: 4.3 10*3/uL (ref 1.7–7.7)
Platelets: 298 10*3/uL (ref 150–400)
RBC: 3.99 MIL/uL (ref 3.87–5.11)
RDW: 13.3 % (ref 11.5–15.5)
WBC: 7.1 10*3/uL (ref 4.0–10.5)

## 2013-10-08 MED ORDER — MELOXICAM 7.5 MG PO TABS: 7.5000 mg | ORAL_TABLET | Freq: Every day | ORAL | Status: DC

## 2013-10-08 NOTE — Patient Instructions (Signed)
Please complete lab work prior to leaving. Start meloxicam (anti-inflammatory) Wear your right wrist brace at night. Follow up in 1 month.  Carpal Tunnel Syndrome The carpal tunnel is a narrow area located on the palm side of your wrist. The tunnel is formed by the wrist bones and ligaments. Nerves, blood vessels, and tendons pass through the carpal tunnel. Repeated wrist motion or certain diseases may cause swelling within the tunnel. This swelling pinches the main nerve in the wrist (median nerve) and causes the painful hand and arm condition called carpal tunnel syndrome. CAUSES   Repeated wrist motions.  Wrist injuries.  Certain diseases like arthritis, diabetes, alcoholism, hyperthyroidism, and kidney failure.  Obesity.  Pregnancy. SYMPTOMS   A "pins and needles" feeling in your fingers or hand.  Tingling or numbness in your fingers or hand.  An aching feeling in your entire arm.  Wrist pain that goes up your arm to your shoulder.  Pain that goes down into your palm or fingers.  A weak feeling in your hands. DIAGNOSIS  Your caregiver will take your history and perform a physical exam. An electromyography test may be needed. This test measures electrical signals sent out by the muscles. The electrical signals are usually slowed by carpal tunnel syndrome. You may also need X-rays. TREATMENT  Carpal tunnel syndrome may clear up by itself. Your caregiver may recommend a wrist splint or medicine such as a nonsteroidal anti-inflammatory medicine. Cortisone injections may help. Sometimes, surgery may be needed to free the pinched nerve.  HOME CARE INSTRUCTIONS   Take all medicine as directed by your caregiver. Only take over-the-counter or prescription medicines for pain, discomfort, or fever as directed by your caregiver.  If you were given a splint to keep your wrist from bending, wear it as directed. It is important to wear the splint at night. Wear the splint for as long as  you have pain or numbness in your hand, arm, or wrist. This may take 1 to 2 months.  Rest your wrist from any activity that may be causing your pain. If your symptoms are work-related, you may need to talk to your employer about changing to a job that does not require using your wrist.  Put ice on your wrist after long periods of wrist activity.  Put ice in a plastic bag.  Place a towel between your skin and the bag.  Leave the ice on for 15-20 minutes, 03-04 times a day.  Keep all follow-up visits as directed by your caregiver. This includes any orthopedic referrals, physical therapy, and rehabilitation. Any delay in getting necessary care could result in a delay or failure of your condition to heal. SEEK IMMEDIATE MEDICAL CARE IF:   You have new, unexplained symptoms.  Your symptoms get worse and are not helped or controlled with medicines. MAKE SURE YOU:   Understand these instructions.  Will watch your condition.  Will get help right away if you are not doing well or get worse. Document Released: 06/17/2000 Document Revised: 09/12/2011 Document Reviewed: 05/06/2011 [facility] Patient Information 2014 [place], [place].

## 2013-10-08 NOTE — Progress Notes (Signed)
Pre visit review using our clinic review tool, if applicable. No additional management support is needed unless otherwise documented below in the visit note. 

## 2013-10-08 NOTE — Assessment & Plan Note (Signed)
Continue prozac, suspect she will continue to feel better over the coming weeks now that she is back on prozac.  Follow up in 1 month.

## 2013-10-08 NOTE — Assessment & Plan Note (Signed)
Trial of meloxicam. She has wrist brace and I have advised her to wear at night while she sleeps. If right arm pain worsens or does not improve, consider referral to neurology.

## 2013-10-08 NOTE — Progress Notes (Signed)
Subjective:    Patient ID: Haley Fuentes, female    DOB: 1960-08-16, 53 y.o.   MRN: [id number]  HPI  Haley Fuentes is a 53 yr old female who presents today for follow up.  1) Hyperlipidemia-  On lipitor.    2) Depression- reports that she stopped prozac x 1 week because she ran out.  Felt irritable.  Restarted 1 week ago. Still not herself.    3) R arm weakness since she had shingles. Wakes up with both arms numb.  Reports + metallic taste in mouth.  "white noise." trouble losing weight + fatigue.  + neck pain, sees a "rolfer"  Review of Systems See hpi    Past Medical History  Diagnosis Date  . Hemorrhoids   . Gastric polyp   . Depression   . Anemia     hx of iron deficient anemia  . Joint pain   . Postmenopausal     History   Social History  . Marital Status: Married    Spouse Name: N/A    Number of Children: 2  . Years of Education: N/A   Occupational History  . [employer]    Social History Main Topics  . Smoking status: Former Smoker    Types: Cigarettes    Quit date: 07/04/1985  . Smokeless tobacco: Never Used  . Alcohol Use: Yes     Comment: weekends  . Drug Use: No  . Sexual Activity: Not on file   Other Topics Concern  . Not on file   Social History Narrative   Regular exercise: 30 minutes 5 days a week    Past Surgical History  Procedure Laterality Date  . Endometrial ablation  2007    Novasure  . Tubal ligation  1983  . Tubal ligation      Family History  Problem Relation Age of Onset  . Diabetes Mother   . Heart disease Mother   . Cancer Mother 72    breast cancer  . Stroke Father   . Diabetes Father   . Heart disease Father     CABG, AVR, A-Fib  . Coronary artery disease Sister     Allergies  Allergen Reactions  . Hydrocodone-Acetaminophen   . Omni-Pac     vomitting    Current Outpatient Prescriptions on File Prior to Visit  Medication Sig Dispense Refill  . atorvastatin (LIPITOR) 40 MG tablet TAKE 1 TABLET (40 MG  TOTAL) BY MOUTH DAILY.  90 tablet  1  . cyclobenzaprine (FLEXERIL) 10 MG tablet TAKE 1 TABLET BY MOUTH 2 TIMES DAILY AS NEEDED.  30 tablet  0  . estradiol (ESTRACE) 0.5 MG tablet Take 0.5 mg by mouth daily.        Marland Kitchen FLUoxetine (PROZAC) 20 MG capsule Take 1 capsule (20 mg total) by mouth daily.  90 capsule  1  . lidocaine (LIDODERM) 5 % Place 1 patch onto the skin daily. Remove & Discard patch within 12 hours or as directed by MD  30 patch  0  . medroxyPROGESTERone (PROVERA) 2.5 MG tablet Take 1 tablet by mouth daily.      . Multiple Vitamins-Minerals (MULTIVITAMIN WITH MINERALS) tablet 1 tablet.        Marland Kitchen omeprazole (PRILOSEC) 40 MG capsule Take 1 capsule (40 mg total) by mouth 2 (two) times daily as needed.  60 capsule  5  . zolpidem (AMBIEN) 10 MG tablet 1/2 to 1 tab po qhs prn sleep  30 tablet  0  No current facility-administered medications on file prior to visit.    BP 118/86  Pulse 60  Temp(Src) 97.9 F (36.6 C) (Oral)  Ht 5\' 3"  (1.6 m)  Wt 164 lb 1.9 oz (74.444 kg)  BMI 29.08 kg/m2  SpO2 97%    Objective:   Physical Exam  Constitutional: She appears well-developed and well-nourished. No distress.  HENT:  Head: Normocephalic and atraumatic.  Right Ear: Tympanic membrane and ear canal normal.  Left Ear: Tympanic membrane and ear canal normal.  Cardiovascular: Normal rate and regular rhythm.   No murmur heard. Lymphadenopathy:    She has no cervical adenopathy.  Neurological:  Strong equal hand grasps, bilateral UE strength is 5/5 + phalens, + tinel right hand          Assessment & Plan:

## 2013-10-08 NOTE — Telephone Encounter (Signed)
Received call from pt stating [facility] did not have her meloxicam rx.  Upon review of chart rx went to [facility]. Pt requests that we send it to [facility] on [address]. Rx cancelled at [facility] and sent to [place]. Pt aware.

## 2013-10-08 NOTE — Assessment & Plan Note (Signed)
Continue lipid panel, lft continue statin.

## 2013-10-09 ENCOUNTER — Encounter: Payer: Self-pay | Admitting: Family

## 2013-10-09 LAB — TSH: TSH: 2.271 u[IU]/mL (ref 0.350–4.500)

## 2013-10-17 ENCOUNTER — Encounter: Payer: Self-pay | Admitting: Physician Assistant

## 2013-10-17 ENCOUNTER — Ambulatory Visit (INDEPENDENT_AMBULATORY_CARE_PROVIDER_SITE_OTHER): Payer: 59 | Admitting: Physician Assistant

## 2013-10-17 VITALS — BP 104/78 | HR 65 | Temp 97.6°F | Resp 16 | Ht 63.0 in | Wt 162.0 lb

## 2013-10-17 DIAGNOSIS — K219 Gastro-esophageal reflux disease without esophagitis: Secondary | ICD-10-CM

## 2013-10-17 DIAGNOSIS — R1013 Epigastric pain: Secondary | ICD-10-CM

## 2013-10-17 LAB — COMPREHENSIVE METABOLIC PANEL
ALK PHOS: 73 U/L (ref 39–117)
ALT: 23 U/L (ref 0–35)
AST: 19 U/L (ref 0–37)
Albumin: 4.6 g/dL (ref 3.5–5.2)
BILIRUBIN TOTAL: 0.7 mg/dL (ref 0.2–1.2)
BUN: 13 mg/dL (ref 6–23)
CO2: 26 mEq/L (ref 19–32)
Calcium: 9.6 mg/dL (ref 8.4–10.5)
Chloride: 104 mEq/L (ref 96–112)
Creat: 0.66 mg/dL (ref 0.50–1.10)
GLUCOSE: 90 mg/dL (ref 70–99)
POTASSIUM: 4.2 meq/L (ref 3.5–5.3)
Sodium: 139 mEq/L (ref 135–145)
Total Protein: 7 g/dL (ref 6.0–8.3)

## 2013-10-17 LAB — CBC WITH DIFFERENTIAL/PLATELET
BASOS PCT: 0 % (ref 0–1)
Basophils Absolute: 0 10*3/uL (ref 0.0–0.1)
EOS ABS: 0.2 10*3/uL (ref 0.0–0.7)
Eosinophils Relative: 2 % (ref 0–5)
HCT: 35.9 % — ABNORMAL LOW (ref 36.0–46.0)
HEMOGLOBIN: 12.2 g/dL (ref 12.0–15.0)
Lymphocytes Relative: 31 % (ref 12–46)
Lymphs Abs: 2.3 10*3/uL (ref 0.7–4.0)
MCH: 30.7 pg (ref 26.0–34.0)
MCHC: 34 g/dL (ref 30.0–36.0)
MCV: 90.4 fL (ref 78.0–100.0)
Monocytes Absolute: 0.5 10*3/uL (ref 0.1–1.0)
Monocytes Relative: 6 % (ref 3–12)
NEUTROS ABS: 4.6 10*3/uL (ref 1.7–7.7)
NEUTROS PCT: 61 % (ref 43–77)
PLATELETS: 266 10*3/uL (ref 150–400)
RBC: 3.97 MIL/uL (ref 3.87–5.11)
RDW: 13.4 % (ref 11.5–15.5)
WBC: 7.5 10*3/uL (ref 4.0–10.5)

## 2013-10-17 LAB — LIPASE: Lipase: 15 U/L (ref 0–75)

## 2013-10-17 MED ORDER — OMEPRAZOLE 40 MG PO CPDR: 40.0000 mg | DELAYED_RELEASE_CAPSULE | Freq: Two times a day (BID) | ORAL | Status: DC | PRN

## 2013-10-17 NOTE — Progress Notes (Signed)
Patient presents to clinic today c/o acid reflux, globus and epigastric pain.  Epigastric pain began last night after taking medications.  Still present but mild.  Patient with history of GERD requiring Prilosec 40 mg BID dosing.  Patient has had unremarkable GI workup previously.  No hx of H. Pylori.  Patient has only been taking Prilosec once daily. Has recently started Mobic for CTS.  Denies alcohol consumption.  Denies change to bowel habits.  Denies hematochezia, melena or tenesmus.  Past Medical History  Diagnosis Date  . Hemorrhoids   . Gastric polyp   . Depression   . Anemia     hx of iron deficient anemia  . Joint pain   . Postmenopausal     Current Outpatient Prescriptions on File Prior to Visit  Medication Sig Dispense Refill  . atorvastatin (LIPITOR) 40 MG tablet TAKE 1 TABLET (40 MG TOTAL) BY MOUTH DAILY.  90 tablet  1  . cyclobenzaprine (FLEXERIL) 10 MG tablet TAKE 1 TABLET BY MOUTH 2 TIMES DAILY AS NEEDED.  30 tablet  0  . estradiol (ESTRACE) 0.5 MG tablet Take 0.5 mg by mouth daily.        Marland Kitchen FLUoxetine (PROZAC) 20 MG capsule Take 1 capsule (20 mg total) by mouth daily.  90 capsule  1  . lidocaine (LIDODERM) 5 % Place 1 patch onto the skin daily. Remove & Discard patch within 12 hours or as directed by MD  30 patch  0  . medroxyPROGESTERone (PROVERA) 2.5 MG tablet Take 1 tablet by mouth daily.      . meloxicam (MOBIC) 7.5 MG tablet Take 1 tablet (7.5 mg total) by mouth daily.  14 tablet  0  . Multiple Vitamins-Minerals (MULTIVITAMIN WITH MINERALS) tablet 1 tablet.        Marland Kitchen zolpidem (AMBIEN) 10 MG tablet 1/2 to 1 tab po qhs prn sleep  30 tablet  0   No current facility-administered medications on file prior to visit.    Allergies  Allergen Reactions  . Hydrocodone-Acetaminophen   . Omni-Pac     vomitting    Family History  Problem Relation Age of Onset  . Diabetes Mother   . Heart disease Mother   . Cancer Mother 3    breast cancer  . Stroke Father   .  Diabetes Father   . Heart disease Father     CABG, AVR, A-Fib  . Coronary artery disease Sister     History   Social History  . Marital Status: Married    Spouse Name: N/A    Number of Children: 2  . Years of Education: N/A   Occupational History  . [employer]    Social History Main Topics  . Smoking status: Former Smoker    Types: Cigarettes    Quit date: 07/04/1985  . Smokeless tobacco: Never Used  . Alcohol Use: Yes     Comment: weekends  . Drug Use: No  . Sexual Activity: None   Other Topics Concern  . None   Social History Narrative   Regular exercise: 30 minutes 5 days a week   Review of Systems - See HPI.  All other ROS are negative.  BP 104/78  Pulse 65  Temp(Src) 97.6 F (36.4 C) (Oral)  Resp 16  Ht '5\' 3"'  (1.6 m)  Wt 162 lb (73.483 kg)  BMI 28.70 kg/m2  SpO2 96%  Physical Exam  Vitals reviewed. Constitutional: She is oriented to person, place, and time and well-developed, well-nourished,  and in no distress.  HENT:  Head: Normocephalic and atraumatic.  Eyes: Conjunctivae are normal.  Cardiovascular: Normal rate, regular rhythm, normal heart sounds and intact distal pulses.   Pulmonary/Chest: Effort normal and breath sounds normal. No respiratory distress. She has no wheezes. She has no rales. She exhibits no tenderness.  Abdominal: Soft. Bowel sounds are normal. She exhibits no distension and no mass. There is no hepatosplenomegaly. There is tenderness in the epigastric area. There is no rebound and no guarding.  Neurological: She is alert and oriented to person, place, and time.  Skin: Skin is warm and dry. No rash noted.  Psychiatric: Affect normal.   Recent Results (from the past 2160 hour(s))  CBC WITH DIFFERENTIAL     Status: Abnormal   Collection Time    10/08/13  9:23 AM      Result Value Ref Range   WBC 7.1  4.0 - 10.5 K/uL   RBC 3.99  3.87 - 5.11 MIL/uL   Hemoglobin 12.4  12.0 - 15.0 g/dL   HCT 34.0 (*) 36.0 - 46.0 %   MCV 86.1   78.0 - 100.0 fL   MCH 31.1  26.0 - 34.0 pg   MCHC 34.8  30.0 - 36.0 g/dL   RDW 13.3  11.5 - 15.5 %   Platelets 298  150 - 400 K/uL   Neutrophils Relative % 61  43 - 77 %   Neutro Abs 4.3  1.7 - 7.7 K/uL   Lymphocytes Relative 32  12 - 46 %   Lymphs Abs 2.3  0.7 - 4.0 K/uL   Monocytes Relative 5  3 - 12 %   Monocytes Absolute 0.4  0.1 - 1.0 K/uL   Eosinophils Relative 2  0 - 5 %   Eosinophils Absolute 0.1  0.0 - 0.7 K/uL   Basophils Relative 0  0 - 1 %   Basophils Absolute 0.0  0.0 - 0.1 K/uL   Smear Review Criteria for review not met    TSH     Status: None   Collection Time    10/08/13  9:23 AM      Result Value Ref Range   TSH 2.271  0.350 - 4.500 uIU/mL  HEPATIC FUNCTION PANEL     Status: None   Collection Time    10/08/13  9:23 AM      Result Value Ref Range   Total Bilirubin 0.7  0.2 - 1.2 mg/dL   Bilirubin, Direct 0.1  0.0 - 0.3 mg/dL   Indirect Bilirubin 0.6  0.2 - 1.2 mg/dL   Alkaline Phosphatase 75  39 - 117 U/L   AST 18  0 - 37 U/L   ALT 22  0 - 35 U/L   Total Protein 6.8  6.0 - 8.3 g/dL   Albumin 4.4  3.5 - 5.2 g/dL  LIPID PANEL     Status: Abnormal   Collection Time    10/08/13  9:23 AM      Result Value Ref Range   Cholesterol 194  0 - 200 mg/dL   Comment: ATP III Classification:           < 200        mg/dL        Desirable          200 - 239     mg/dL        Borderline High          >= 240  mg/dL        High         Triglycerides 181 (*) <150 mg/dL   HDL 52  >39 mg/dL   Total CHOL/HDL Ratio 3.7     VLDL 36  0 - 40 mg/dL   LDL Cholesterol 106 (*) 0 - 99 mg/dL   Comment:       Total Cholesterol/HDL Ratio:CHD Risk                            Coronary Heart Disease Risk Table                                            Men       Women              1/2 Average Risk              3.4        3.3                  Average Risk              5.0        4.4               2X Average Risk              9.6        7.1               3X Average Risk              23.4       11.0     Use the calculated Patient Ratio above and the CHD Risk table      to determine the patient's CHD Risk.     ATP III Classification (LDL):           < 100        mg/dL         Optimal          100 - 129     mg/dL         Near or Above Optimal          130 - 159     mg/dL         Borderline High          160 - 189     mg/dL         High           > 190        mg/dL         Very High         Assessment/Plan: GERD (gastroesophageal reflux disease) Acute exacerbation. Increase Prilosec to BID dosing x 1 week.  Two Tums at bedtime. STOP MOBIC.  Will obtain labs due to epigastric tenderness.  Abdominal pain, epigastric Will obtain CBC, CMP, Lipase and H. Pylori IgG.  See today's treatment plan for GERD.

## 2013-10-17 NOTE — Progress Notes (Signed)
Pre visit review using our clinic review tool, if applicable. No additional management support is needed unless otherwise documented below in the visit note/SLS  

## 2013-10-17 NOTE — Patient Instructions (Signed)
Please obtain labs. I will call you with your results.  Increase Prilosec to twice a day.  Take two TUMS before bed.  Foods as tolerated.  Stay well hydrated.  STOP Meloxicam.  Call or return to clinic if symptoms are not improving.

## 2013-10-17 NOTE — Assessment & Plan Note (Signed)
Acute exacerbation. Increase Prilosec to BID dosing x 1 week.  Two Tums at bedtime. STOP MOBIC.  Will obtain labs due to epigastric tenderness.

## 2013-10-17 NOTE — Assessment & Plan Note (Signed)
Will obtain CBC, CMP, Lipase and H. Pylori IgG.  See today's treatment plan for GERD.

## 2013-10-18 ENCOUNTER — Telehealth: Payer: Self-pay | Admitting: Family

## 2013-10-18 LAB — H. PYLORI ANTIBODY, IGG

## 2013-10-18 NOTE — Telephone Encounter (Signed)
Received PA for Omperazole, form forward to nurse

## 2013-10-21 NOTE — Telephone Encounter (Signed)
Started form, left message for pt to return my call. Need to know other alternatives pt has tried and failed in the past.

## 2013-10-22 NOTE — Telephone Encounter (Signed)
Spoke with pt. She states she tried some other medication years ago but cannot remember the name. Pt symptoms controlled on current dose of omeprazole > 2 yrs.  Form completed and forwarded to Provider for signature.

## 2013-10-22 NOTE — Telephone Encounter (Signed)
Form faxed to [facility] @ [phone].  Awaiting determination.

## 2013-10-22 NOTE — Telephone Encounter (Signed)
Signed.

## 2013-10-23 NOTE — Telephone Encounter (Signed)
Approval received for Omeprazole valid 04.21.15 - 10.21.15; faxed to pharmacy/SLS

## 2013-10-25 ENCOUNTER — Encounter: Payer: Self-pay | Admitting: *Deleted

## 2013-11-08 ENCOUNTER — Ambulatory Visit: Payer: Self-pay | Admitting: Family

## 2013-11-14 ENCOUNTER — Telehealth: Payer: Self-pay | Admitting: Family

## 2013-11-14 DIAGNOSIS — K219 Gastro-esophageal reflux disease without esophagitis: Secondary | ICD-10-CM

## 2013-11-14 NOTE — Telephone Encounter (Signed)
Refill- atorvastatin  Refill- omeprazole  Refill- zolpidem  Refill- cyclobenzaprine  [facility]

## 2013-11-15 MED ORDER — CYCLOBENZAPRINE HCL 10 MG PO TABS: ORAL_TABLET | ORAL | Status: DC

## 2013-11-15 MED ORDER — OMEPRAZOLE 40 MG PO CPDR: 40.0000 mg | DELAYED_RELEASE_CAPSULE | Freq: Two times a day (BID) | ORAL | Status: DC | PRN

## 2013-11-15 MED ORDER — ATORVASTATIN CALCIUM 40 MG PO TABS: ORAL_TABLET | ORAL | Status: DC

## 2013-11-15 NOTE — Telephone Encounter (Signed)
Rx was sent to [facility] in error. Will need to call them Monday to cancel eRxs. Re-sent refills to [facility] as requested below.

## 2013-11-15 NOTE — Telephone Encounter (Signed)
OK to send 90 tabs flexeril 90 day supply of atorvastatin, omeprazole Can only send 30 days at a time of ambien.

## 2013-11-15 NOTE — Addendum Note (Signed)
Addended by: Kelle Darting A on: 11/15/2013 04:11 PM   Modules accepted: Orders

## 2013-11-15 NOTE — Telephone Encounter (Signed)
Please advise re: zolpidem and cyclobenzaprine refills to mail order?

## 2013-11-15 NOTE — Telephone Encounter (Signed)
Refills sent. Notified pt re: zolpidem and she states she still has current supply on hand and doesn't need refill at present. She will contact us when refill is needed and we can send it to [facility].

## 2013-11-18 NOTE — Telephone Encounter (Signed)
Spoke with Miranda at [facility]. They cancelled Rx since pt does not have an active acct with them. Rxs already sent to [facility].

## 2014-01-03 ENCOUNTER — Other Ambulatory Visit: Payer: Self-pay | Admitting: Family

## 2014-05-08 ENCOUNTER — Other Ambulatory Visit: Payer: Self-pay | Admitting: Family

## 2014-05-09 MED ORDER — ZOLPIDEM TARTRATE 10 MG PO TABS: ORAL_TABLET | ORAL | Status: DC

## 2014-05-09 MED ORDER — CYCLOBENZAPRINE HCL 10 MG PO TABS: ORAL_TABLET | ORAL | Status: DC

## 2014-05-09 NOTE — Telephone Encounter (Signed)
Rx request faxed to pharmacy/SLS  

## 2014-05-09 NOTE — Telephone Encounter (Signed)
Ok to send meds as below.

## 2014-07-31 ENCOUNTER — Other Ambulatory Visit: Payer: Self-pay | Admitting: Family

## 2014-07-31 NOTE — Telephone Encounter (Signed)
Last filled: 05/09/14 Amt:  60, 0 refills Last OV:  10/17/13   Please advise.

## 2014-08-01 NOTE — Telephone Encounter (Signed)
Ok to send 60 tabs zero refills. 

## 2014-09-03 ENCOUNTER — Encounter: Payer: Self-pay | Admitting: Family

## 2014-09-04 ENCOUNTER — Encounter: Payer: Self-pay | Admitting: Medical

## 2014-09-04 ENCOUNTER — Ambulatory Visit (INDEPENDENT_AMBULATORY_CARE_PROVIDER_SITE_OTHER): Payer: 59 | Admitting: Medical

## 2014-09-04 VITALS — BP 135/87 | HR 62 | Temp 97.7°F | Ht 63.0 in | Wt 169.8 lb

## 2014-09-04 DIAGNOSIS — M7661 Achilles tendinitis, right leg: Secondary | ICD-10-CM | POA: Diagnosis not present

## 2014-09-04 DIAGNOSIS — L989 Disorder of the skin and subcutaneous tissue, unspecified: Secondary | ICD-10-CM

## 2014-09-04 MED ORDER — DICLOFENAC SODIUM 75 MG PO TBEC: 75.0000 mg | DELAYED_RELEASE_TABLET | Freq: Two times a day (BID) | ORAL | Status: DC

## 2014-09-04 NOTE — Assessment & Plan Note (Signed)
Mild atypical appearance. Will refer you to dermatologist. Surveillance of moles can be done at that time as well.

## 2014-09-04 NOTE — Progress Notes (Signed)
Pre visit review using our clinic review tool, if applicable. No additional management support is needed unless otherwise documented below in the visit note. 

## 2014-09-04 NOTE — Patient Instructions (Addendum)
Tendonitis, Achilles, right Rest, ice, diclofenac. Stretch. If by one week still pain then sports medicine referral   Skin lesion Mild atypical appearance. Will refer you to dermatologist. Surveillance of moles can be done at that time as well.      Follow up 7 days or as needed.

## 2014-09-04 NOTE — Progress Notes (Signed)
Subjective:    Patient ID: Haley Fuentes, female    DOB: February 20, 1961, 54 y.o.   MRN: [id number]  HPI   Pt in with some ankle pain. She has been doing some walking in her house. But also doing some light aerobics. No hx of injury to this ankle. No fever or chills. These new exercises have been for about 2 months. Pain came on slowly. Then later in exam she points to achilles tendon. Pt has taken ibuprofen.  Also on her rt wrist. Irregular area of skin for one year. This area present for one year. Smaller one year.    Review of Systems  Constitutional: Negative for fever, chills and fatigue.  Respiratory: Negative for cough, chest tightness, shortness of breath and wheezing.   Cardiovascular: Negative for chest pain and palpitations.  Musculoskeletal:       Rt achilles tendon pain.  Skin:       Rt distal forearm lesion   Past Medical History  Diagnosis Date  . Hemorrhoids   . Gastric polyp   . Depression   . Anemia     hx of iron deficient anemia  . Joint pain   . Postmenopausal     History   Social History  . Marital Status: Married    Spouse Name: N/A  . Number of Children: 2  . Years of Education: N/A   Occupational History  . [employer]    Social History Main Topics  . Smoking status: Former Smoker    Types: Cigarettes    Quit date: 07/04/1985  . Smokeless tobacco: Never Used  . Alcohol Use: Yes     Comment: weekends  . Drug Use: No  . Sexual Activity: Not on file   Other Topics Concern  . Not on file   Social History Narrative   Regular exercise: 30 minutes 5 days a week    Past Surgical History  Procedure Laterality Date  . Endometrial ablation  2007    Novasure  . Tubal ligation  1983  . Tubal ligation      Family History  Problem Relation Age of Onset  . Diabetes Mother   . Heart disease Mother   . Cancer Mother 66    breast cancer  . Stroke Father   . Diabetes Father   . Heart disease Father     CABG, AVR, A-Fib  . Coronary  artery disease Sister     Allergies  Allergen Reactions  . Hydrocodone-Acetaminophen   . Omni-Pac     vomitting    Current Outpatient Prescriptions on File Prior to Visit  Medication Sig Dispense Refill  . cyclobenzaprine (FLEXERIL) 10 MG tablet TAKE ONE TABLET BY MOUTH TWICE DAILY AS NEEDED 60 tablet 0  . estradiol (ESTRACE) 0.5 MG tablet Take 0.5 mg by mouth daily.      Marland Kitchen FLUoxetine (PROZAC) 20 MG capsule Take 1 capsule (20 mg total) by mouth daily. 90 capsule 1  . lidocaine (LIDODERM) 5 % Place 1 patch onto the skin daily. Remove & Discard patch within 12 hours or as directed by MD 30 patch 0  . medroxyPROGESTERone (PROVERA) 2.5 MG tablet Take 1 tablet by mouth daily.    . Multiple Vitamins-Minerals (MULTIVITAMIN WITH MINERALS) tablet 1 tablet.      Marland Kitchen zolpidem (AMBIEN) 10 MG tablet Take 1/2 to 1 tab po qhs prn sleep 30 tablet 0   No current facility-administered medications on file prior to visit.    BP 135/87 mmHg  Pulse 62  Temp(Src) 97.7 F (36.5 C) (Oral)  Ht 5\' 3"  (1.6 m)  Wt 169 lb 12.8 oz (77.021 kg)  BMI 30.09 kg/m2  SpO2 100%      Objective:   Physical Exam   General- no acute distress. Lt ankle- no swelling, no redness and nontener. Achilles tendon.Directly mid aspect. Pain on dorsi and plantar flexion  Rt distal forearm- 8-9 mm irregualar lesion. Non  Pigment/pale appearance        Assessment & Plan:

## 2014-09-04 NOTE — Assessment & Plan Note (Signed)
Rest, ice, diclofenac. Stretch. If by one week still pain then sports medicine referral

## 2014-09-10 ENCOUNTER — Telehealth: Payer: Self-pay | Admitting: Family

## 2014-09-10 DIAGNOSIS — M25579 Pain in unspecified ankle and joints of unspecified foot: Secondary | ICD-10-CM

## 2014-09-10 NOTE — Telephone Encounter (Signed)
Caller name: Ahrianna, Siglin Relation to pt: self  Call back number: [phone]   Reason for call:  Pt wanted to advise PA she is still expercicing pain in ankle. In need of clinical advice

## 2014-09-11 ENCOUNTER — Encounter: Payer: Self-pay | Admitting: Family Medicine

## 2014-09-11 ENCOUNTER — Ambulatory Visit (INDEPENDENT_AMBULATORY_CARE_PROVIDER_SITE_OTHER): Payer: 59 | Admitting: Family Medicine

## 2014-09-11 VITALS — BP 118/81 | HR 83 | Ht 63.0 in | Wt 168.0 lb

## 2014-09-11 DIAGNOSIS — M7661 Achilles tendinitis, right leg: Secondary | ICD-10-CM

## 2014-09-11 MED ORDER — NITROGLYCERIN 0.2 MG/HR TD PT24: MEDICATED_PATCH | TRANSDERMAL | Status: DC

## 2014-09-11 MED ORDER — OXYCODONE-ACETAMINOPHEN 5-325 MG PO TABS: 1.0000 | ORAL_TABLET | Freq: Four times a day (QID) | ORAL | Status: DC | PRN

## 2014-09-11 NOTE — Patient Instructions (Signed)
You have achilles tendinopathy Tylenol or aleve as needed for pain Work your way up to lowering/raise on a step exercises 3 x 10 once or twice a day - two feet first on level ground then one (start with 3 x 6) Can add heel walks, toe walks forward and backward as well Ice bucket 10-15 minutes at end of day - can ice 3-4 times a day. Avoid uneven ground, hills. Heel lifts when up and walking around. Consider physical therapy Nitro patches 1/4 of 0.2mg /hr patch and change daily Follow up with me in 5-6 weeks.

## 2014-09-15 ENCOUNTER — Telehealth: Payer: Self-pay | Admitting: Family Medicine

## 2014-09-15 NOTE — Progress Notes (Signed)
PCP: Nance Pear., NP Referred by: Mackie Pai PA  Subjective:   HPI: Patient is a 54 y.o. female here for right heel/ankle pain.  Patient reports pain in right heel has been slowly coming on past 3 weeks but worse past 1 1/2 weeks. Tender to the touch - feels like a hot poker posterior heel. Has been resting, taking ibuprofen (voltaren gave her diarrhea). Icing, using an ACE wrap. Pain goes into heel and calf. Doing a walking program.  Past Medical History  Diagnosis Date  . Hemorrhoids   . Gastric polyp   . Depression   . Anemia     hx of iron deficient anemia  . Joint pain   . Postmenopausal     Current Outpatient Prescriptions on File Prior to Visit  Medication Sig Dispense Refill  . cyclobenzaprine (FLEXERIL) 10 MG tablet TAKE ONE TABLET BY MOUTH TWICE DAILY AS NEEDED 60 tablet 0  . diclofenac (VOLTAREN) 75 MG EC tablet Take 1 tablet (75 mg total) by mouth 2 (two) times daily. 30 tablet 0  . estradiol (ESTRACE) 0.5 MG tablet Take 0.5 mg by mouth daily.      Marland Kitchen FLUoxetine (PROZAC) 20 MG capsule Take 1 capsule (20 mg total) by mouth daily. 90 capsule 1  . lidocaine (LIDODERM) 5 % Place 1 patch onto the skin daily. Remove & Discard patch within 12 hours or as directed by MD 30 patch 0  . medroxyPROGESTERone (PROVERA) 2.5 MG tablet Take 1 tablet by mouth daily.    . Multiple Vitamins-Minerals (MULTIVITAMIN WITH MINERALS) tablet 1 tablet.      Marland Kitchen zolpidem (AMBIEN) 10 MG tablet Take 1/2 to 1 tab po qhs prn sleep 30 tablet 0   No current facility-administered medications on file prior to visit.    Past Surgical History  Procedure Laterality Date  . Endometrial ablation  2007    Novasure  . Tubal ligation  1983  . Tubal ligation      Allergies  Allergen Reactions  . Hydrocodone-Acetaminophen   . Omni-Pac     vomitting  . Voltaren [Diclofenac]     History   Social History  . Marital Status: Married    Spouse Name: N/A  . Number of Children: 2  .  Years of Education: N/A   Occupational History  . [employer]    Social History Main Topics  . Smoking status: Former Smoker    Types: Cigarettes    Quit date: 07/04/1985  . Smokeless tobacco: Never Used  . Alcohol Use: 0.0 oz/week    0 Standard drinks or equivalent per week     Comment: weekends  . Drug Use: No  . Sexual Activity: Not on file   Other Topics Concern  . Not on file   Social History Narrative   Regular exercise: 30 minutes 5 days a week    Family History  Problem Relation Age of Onset  . Diabetes Mother   . Heart disease Mother   . Cancer Mother 67    breast cancer  . Stroke Father   . Diabetes Father   . Heart disease Father     CABG, AVR, A-Fib  . Coronary artery disease Sister     BP 118/81 mmHg  Pulse 83  Ht 5\' 3"  (1.6 m)  Wt 168 lb (76.204 kg)  BMI 29.77 kg/m2  Review of Systems: See HPI above.    Objective:  Physical Exam:  Gen: NAD  Right foot/ankle: Swelling within achilles about 2  cm proximal to insertion.  No bruising, other deformity. FROM with pain on plantarflexion TTP Achilles proximal to insertion.  No other tenderness of foot/ankle. Negative ant drawer and talar tilt.   Negative syndesmotic compression. Thompsons test negative. NV intact distally.    MSK u/s:  Right achilles thickness 0.82cm at nodule but no increased neovascularity or tears present.  Assessment & Plan:  1. Right achilles tendinitis - shown home exercises to do daily.  Icing, nsaids.  Avoid uneven ground and hills.  Heel lifts to help rest achilles.  Nitro patches - discussed risks of headache, skin irritation.  F/u in 5-6 weeks.

## 2014-09-15 NOTE — Telephone Encounter (Signed)
Spoke to patient and told her that I contacted the pharmacy and they said that they do not recommend showering with the nitro patch on.

## 2014-09-16 NOTE — Assessment & Plan Note (Signed)
shown home exercises to do daily.  Icing, nsaids.  Avoid uneven ground and hills.  Heel lifts to help rest achilles.  Nitro patches - discussed risks of headache, skin irritation.  F/u in 5-6 weeks.

## 2014-09-25 ENCOUNTER — Other Ambulatory Visit: Payer: Self-pay | Admitting: Family

## 2014-10-17 ENCOUNTER — Encounter: Payer: Self-pay | Admitting: Family Medicine

## 2014-10-17 ENCOUNTER — Ambulatory Visit (INDEPENDENT_AMBULATORY_CARE_PROVIDER_SITE_OTHER): Payer: 59 | Admitting: Family Medicine

## 2014-10-17 VITALS — BP 126/81 | HR 63 | Ht 63.0 in | Wt 170.0 lb

## 2014-10-17 DIAGNOSIS — M7661 Achilles tendinitis, right leg: Secondary | ICD-10-CM | POA: Diagnosis not present

## 2014-10-17 NOTE — Progress Notes (Signed)
PCP: Nance Pear., NP Referred by: Mackie Pai PA  Subjective:   HPI: Patient is a 54 y.o. female here for right heel/ankle pain.  3/10: Patient reports pain in right heel has been slowly coming on past 3 weeks but worse past 1 1/2 weeks. Tender to the touch - feels like a hot poker posterior heel. Has been resting, taking ibuprofen (voltaren gave her diarrhea). Icing, using an ACE wrap. Pain goes into heel and calf. Doing a walking program.  4/14: Patient reports she's doing about 75-80% better. Still with swelling in achilles though pain is much less. Able to do stationary bike without pain. Using nitro patches, heel lifts, doing home exercises.  Past Medical History  Diagnosis Date  . Hemorrhoids   . Gastric polyp   . Depression   . Anemia     hx of iron deficient anemia  . Joint pain   . Postmenopausal     Current Outpatient Prescriptions on File Prior to Visit  Medication Sig Dispense Refill  . cyclobenzaprine (FLEXERIL) 10 MG tablet TAKE ONE TABLET BY MOUTH TWICE DAILY AS NEEDED 60 tablet 0  . diclofenac (VOLTAREN) 75 MG EC tablet Take 1 tablet (75 mg total) by mouth 2 (two) times daily. 30 tablet 0  . estradiol (ESTRACE) 0.5 MG tablet Take 0.5 mg by mouth daily.      Marland Kitchen FLUoxetine (PROZAC) 20 MG capsule Take 1 capsule (20 mg total) by mouth daily. 90 capsule 1  . lidocaine (LIDODERM) 5 % Place 1 patch onto the skin daily. Remove & Discard patch within 12 hours or as directed by MD 30 patch 0  . medroxyPROGESTERone (PROVERA) 2.5 MG tablet Take 1 tablet by mouth daily.    . Multiple Vitamins-Minerals (MULTIVITAMIN WITH MINERALS) tablet 1 tablet.      . nitroGLYCERIN (NITRODUR - DOSED IN MG/24 HR) 0.2 mg/hr patch Apply 1/4th patch to affected achilles and change daily 30 patch 1  . oxyCODONE-acetaminophen (PERCOCET/ROXICET) 5-325 MG per tablet Take 1 tablet by mouth every 6 (six) hours as needed for severe pain. 40 tablet 0  . zolpidem (AMBIEN) 10 MG tablet  Take 1/2 to 1 tab po qhs prn sleep 30 tablet 0   No current facility-administered medications on file prior to visit.    Past Surgical History  Procedure Laterality Date  . Endometrial ablation  2007    Novasure  . Tubal ligation  1983  . Tubal ligation      Allergies  Allergen Reactions  . Hydrocodone-Acetaminophen   . Omni-Pac     vomitting  . Voltaren [Diclofenac]     History   Social History  . Marital Status: Married    Spouse Name: N/A  . Number of Children: 2  . Years of Education: N/A   Occupational History  . [employer]    Social History Main Topics  . Smoking status: Former Smoker    Types: Cigarettes    Quit date: 07/04/1985  . Smokeless tobacco: Never Used  . Alcohol Use: 0.0 oz/week    0 Standard drinks or equivalent per week     Comment: weekends  . Drug Use: No  . Sexual Activity: Not on file   Other Topics Concern  . Not on file   Social History Narrative   Regular exercise: 30 minutes 5 days a week    Family History  Problem Relation Age of Onset  . Diabetes Mother   . Heart disease Mother   . Cancer Mother 86  breast cancer  . Stroke Father   . Diabetes Father   . Heart disease Father     CABG, AVR, A-Fib  . Coronary artery disease Sister     BP 126/81 mmHg  Pulse 63  Ht 5\' 3"  (1.6 m)  Wt 170 lb (77.111 kg)  BMI 30.12 kg/m2  Review of Systems: See HPI above.    Objective:  Physical Exam:  Gen: NAD  Right foot/ankle: Swelling within achilles about 2 cm proximal to insertion.  No bruising, other deformity. FROM without pain on plantarflexion.  Able to do calf raise Minimal TTP Achilles 2cm proximal to insertion.  No other tenderness of foot/ankle. Negative ant drawer and talar tilt.   Negative syndesmotic compression. Thompsons test negative. NV intact distally.  Assessment & Plan:  1. Right achilles tendinitis - Much improved.  Continue with icing, heel lifts, nitro patches, and home exercises.  F/u in 6  weeks.

## 2014-10-17 NOTE — Patient Instructions (Signed)
Continue with the nitro patches and heel lifts for another 6 weeks. Continue home exercises including 2 feet and/or 1 foot on a step raising/lowering 3 sets of 10 once a day. Icing as needed 15 minutes at a time. Follow up with me in 6 weeks.

## 2014-10-17 NOTE — Assessment & Plan Note (Signed)
Much improved.  Continue with icing, heel lifts, nitro patches, and home exercises.  F/u in 6 weeks.

## 2014-11-04 ENCOUNTER — Other Ambulatory Visit: Payer: Self-pay | Admitting: Family

## 2014-11-04 NOTE — Telephone Encounter (Signed)
Please advise refill:  Medication name:  Name from pharmacy:  cyclobenzaprine (FLEXERIL) 10 MG tablet CYCLOBENZAPR 10MG  TAB     Sig: TAKE ONE TABLET BY MOUTH TWICE DAILY AS NEEDED    Dispense: 60 tablet   Refills: 0   Start: 11/04/2014   Class: Normal    Requested on: 09/26/2014    Originally ordered on: 09/23/2011 09/26/2014

## 2014-11-26 ENCOUNTER — Other Ambulatory Visit: Payer: Self-pay | Admitting: Family

## 2014-11-26 ENCOUNTER — Other Ambulatory Visit: Payer: Self-pay | Admitting: Family Medicine

## 2014-11-26 NOTE — Telephone Encounter (Signed)
Last zolpidem Rx 05/09/14, #30.  Please advise request?

## 2014-11-27 NOTE — Telephone Encounter (Signed)
Ok to send 30 tabs zero refills. 

## 2014-11-28 MED ORDER — ZOLPIDEM TARTRATE 10 MG PO TABS: ORAL_TABLET | ORAL | Status: DC

## 2014-11-28 NOTE — Telephone Encounter (Signed)
Rx called to pharmacy voicemail as below. 

## 2014-11-28 NOTE — Telephone Encounter (Signed)
printed

## 2014-12-16 ENCOUNTER — Telehealth: Payer: Self-pay | Admitting: Family

## 2014-12-16 MED ORDER — CYCLOBENZAPRINE HCL 10 MG PO TABS: 10.0000 mg | ORAL_TABLET | Freq: Two times a day (BID) | ORAL | Status: DC | PRN

## 2014-12-16 NOTE — Telephone Encounter (Signed)
Informed patient of this and she states that she will schedule appt online

## 2014-12-16 NOTE — Telephone Encounter (Signed)
Cyclobenzaprine refill sent to pharmacy.  I do not seen that pt has had follow up or CPE with PCP in > 1 yr.  Please call pt to schedule fasting CPE soon.

## 2014-12-18 ENCOUNTER — Encounter: Payer: Self-pay | Admitting: Family

## 2014-12-19 ENCOUNTER — Encounter: Payer: Self-pay | Admitting: General Practice

## 2014-12-19 LAB — HM MAMMOGRAPHY

## 2014-12-24 ENCOUNTER — Other Ambulatory Visit: Payer: Self-pay | Admitting: Obstetrics and Gynecology

## 2014-12-24 DIAGNOSIS — N63 Unspecified lump in unspecified breast: Secondary | ICD-10-CM

## 2014-12-26 ENCOUNTER — Ambulatory Visit
Admission: RE | Admit: 2014-12-26 | Discharge: 2014-12-26 | Disposition: A | Discharge status: Home / Self Care | Payer: 59 | Source: Ambulatory Visit | Attending: Obstetrics and Gynecology | Admitting: Obstetrics and Gynecology

## 2014-12-26 DIAGNOSIS — N63 Unspecified lump in unspecified breast: Secondary | ICD-10-CM

## 2015-01-08 ENCOUNTER — Telehealth: Payer: Self-pay | Admitting: Family

## 2015-01-08 NOTE — Telephone Encounter (Signed)
error:[id number] ° °

## 2015-01-09 NOTE — Telephone Encounter (Signed)
pre visit letter mailed 01/09/15

## 2015-01-19 ENCOUNTER — Other Ambulatory Visit: Payer: Self-pay | Admitting: Family

## 2015-01-19 NOTE — Telephone Encounter (Signed)
Pt has f/u 01/30/15.  Please advise refills:  Medication name:  Name from pharmacy:  cyclobenzaprine (FLEXERIL) 10 MG tablet CYCLOBENZAPR 10MG  TAB     Sig: TAKE ONE TABLET BY MOUTH TWICE DAILY AS NEEDED    Dispense: 60 tablet   Refills: 0   Start: 01/19/2015   Class: Normal    Requested on: 12/16/2014    Originally ordered on: 09/23/2011 12/16/2014

## 2015-01-28 ENCOUNTER — Encounter: Payer: 59 | Admitting: Family

## 2015-01-28 ENCOUNTER — Telehealth: Payer: Self-pay

## 2015-01-28 NOTE — Telephone Encounter (Signed)
Pre visit call completed 

## 2015-01-30 ENCOUNTER — Ambulatory Visit (INDEPENDENT_AMBULATORY_CARE_PROVIDER_SITE_OTHER): Payer: 59 | Admitting: Family

## 2015-01-30 ENCOUNTER — Encounter: Payer: Self-pay | Admitting: Family

## 2015-01-30 VITALS — BP 104/80 | HR 70 | Temp 98.9°F | Resp 16 | Ht 64.0 in | Wt 171.4 lb

## 2015-01-30 DIAGNOSIS — M129 Arthropathy, unspecified: Secondary | ICD-10-CM

## 2015-01-30 DIAGNOSIS — Z23 Encounter for immunization: Secondary | ICD-10-CM | POA: Diagnosis not present

## 2015-01-30 DIAGNOSIS — M2550 Pain in unspecified joint: Secondary | ICD-10-CM | POA: Diagnosis not present

## 2015-01-30 DIAGNOSIS — Z0000 Encounter for general adult medical examination without abnormal findings: Secondary | ICD-10-CM

## 2015-01-30 LAB — URINALYSIS, ROUTINE W REFLEX MICROSCOPIC
Bilirubin Urine: NEGATIVE
Hgb urine dipstick: NEGATIVE
Ketones, ur: NEGATIVE
Leukocytes, UA: NEGATIVE
Nitrite: NEGATIVE
PH: 5.5 (ref 5.0–8.0)
Specific Gravity, Urine: >=1.03 — AB (ref 1.000–1.030)
Total Protein, Urine: NEGATIVE
URINE GLUCOSE: NEGATIVE
Urobilinogen, UA: 0.2 (ref 0.0–1.0)

## 2015-01-30 LAB — CBC WITH DIFFERENTIAL/PLATELET
BASOS PCT: 0.3 % (ref 0.0–3.0)
Basophils Absolute: 0 10*3/uL (ref 0.0–0.1)
Eosinophils Absolute: 0.2 10*3/uL (ref 0.0–0.7)
Eosinophils Relative: 3.9 % (ref 0.0–5.0)
HCT: 34.6 % — ABNORMAL LOW (ref 36.0–46.0)
HEMOGLOBIN: 11.9 g/dL — AB (ref 12.0–15.0)
LYMPHS ABS: 1.8 10*3/uL (ref 0.7–4.0)
Lymphocytes Relative: 29.8 % (ref 12.0–46.0)
MCHC: 34.2 g/dL (ref 30.0–36.0)
MCV: 93.7 fl (ref 78.0–100.0)
Monocytes Absolute: 0.4 10*3/uL (ref 0.1–1.0)
Monocytes Relative: 6.7 % (ref 3.0–12.0)
NEUTROS ABS: 3.6 10*3/uL (ref 1.4–7.7)
Neutrophils Relative %: 59.3 % (ref 43.0–77.0)
Platelets: 230 10*3/uL (ref 150.0–400.0)
RBC: 3.7 Mil/uL — AB (ref 3.87–5.11)
RDW: 13.3 % (ref 11.5–15.5)
WBC: 6 10*3/uL (ref 4.0–10.5)

## 2015-01-30 LAB — BASIC METABOLIC PANEL
BUN: 14 mg/dL (ref 6–23)
CO2: 25 meq/L (ref 19–32)
Calcium: 8.9 mg/dL (ref 8.4–10.5)
Chloride: 109 mEq/L (ref 96–112)
Creatinine, Ser: 0.69 mg/dL (ref 0.40–1.20)
GFR: 94.23 mL/min (ref >60.00)
GLUCOSE: 94 mg/dL (ref 70–99)
Potassium: 4.3 mEq/L (ref 3.5–5.1)
SODIUM: 142 meq/L (ref 135–145)

## 2015-01-30 LAB — LIPID PANEL
CHOLESTEROL: 209 mg/dL — AB (ref 0–200)
HDL: 41.1 mg/dL (ref >39.00)
LDL Cholesterol: 143 mg/dL — ABNORMAL HIGH (ref 0–99)
NonHDL: 167.69
Total CHOL/HDL Ratio: 5
Triglycerides: 125 mg/dL (ref 0.0–149.0)
VLDL: 25 mg/dL (ref 0.0–40.0)

## 2015-01-30 LAB — HEPATIC FUNCTION PANEL
ALK PHOS: 84 U/L (ref 39–117)
ALT: 20 U/L (ref 0–35)
AST: 17 U/L (ref 0–37)
Albumin: 4 g/dL (ref 3.5–5.2)
Bilirubin, Direct: 0.1 mg/dL (ref 0.0–0.3)
Total Bilirubin: 0.3 mg/dL (ref 0.2–1.2)
Total Protein: 6.6 g/dL (ref 6.0–8.3)

## 2015-01-30 LAB — SEDIMENTATION RATE: Sed Rate: 25 mm/hr — ABNORMAL HIGH (ref 0–22)

## 2015-01-30 LAB — RHEUMATOID FACTOR

## 2015-01-30 LAB — TSH: TSH: 3.37 u[IU]/mL (ref 0.35–4.50)

## 2015-01-30 MED ORDER — FLUOXETINE HCL 20 MG PO CAPS: 20.0000 mg | ORAL_CAPSULE | Freq: Every day | ORAL | Status: DC

## 2015-01-30 MED ORDER — LIDOCAINE 5 % EX PTCH: 1.0000 | MEDICATED_PATCH | CUTANEOUS | Status: DC

## 2015-01-30 NOTE — Patient Instructions (Addendum)
Please complete lab work prior to leaving.   

## 2015-01-30 NOTE — Assessment & Plan Note (Signed)
Will check autoimmune panel

## 2015-01-30 NOTE — Assessment & Plan Note (Signed)
Immunizations reviewed- due for tdap today.  Continue healthy diet- she will begin counting calories using my fitness pal and add some cardio.   Obtain routine labs.

## 2015-01-30 NOTE — Assessment & Plan Note (Signed)
Advised pt to change to tylenol. Obtain ESR, RA, ANA to exclude autoimmune etiology.  However, suspect OA.

## 2015-01-30 NOTE — Progress Notes (Signed)
Subjective:    Patient ID: Haley Fuentes, female    DOB: [birth year], 54 y.o.   MRN: [id number]  HPI  Ms. Haley Fuentes is a 54 yr old female who presents today for cpx.  Immunizations:  Due for tdap Diet: trying to eat healthy Exercise: not exercising due to right achilles pain. Has been trying to walk.   Wt Readings from Last 3 Encounters:  01/30/15 171 lb 6.4 oz (77.747 kg)  10/17/14 170 lb (77.111 kg)  09/11/14 168 lb (76.204 kg)    Breakfast- smoothie (fruit and veggies) Lunch- chicken breast, cheese, lettuce/kale on tortilla or salad Snack- popcorn or fruit  Dinner- meat, veggies, baked potato HS snack- popcorn or cheese sick and sandwich meat (some potato chips) Colonoscopy: 2009- normal Pap Smear: 2015 Mammogram: 12/26/14 Eye exam- due Dental- up to date  Arthralgia- Reports that "every joint in my body aches" unless she takes motrin every day.    Review of Systems  Constitutional: Negative for unexpected weight change.  HENT: Negative for hearing loss and rhinorrhea.   Eyes: Negative for visual disturbance.  Respiratory: Negative for cough.   Cardiovascular: Negative for leg swelling.  Gastrointestinal: Negative for diarrhea and constipation.  Genitourinary: Negative for dysuria and frequency.  Musculoskeletal: Positive for arthralgias. Negative for myalgias.  Skin: Negative for rash.  Neurological: Negative for headaches.  Hematological: Negative for adenopathy.  Psychiatric/Behavioral: Negative for dysphoric mood and agitation.       Past Medical History  Diagnosis Date  . Hemorrhoids   . Gastric polyp   . Depression   . Anemia     hx of iron deficient anemia  . Joint pain   . Postmenopausal     History   Social History  . Marital Status: Married    Spouse Name: N/A  . Number of Children: 2  . Years of Education: N/A   Occupational History  . [employer]    Social History Main Topics  . Smoking status: Former Smoker    Types: Cigarettes   Quit date: 07/04/1985  . Smokeless tobacco: Never Used  . Alcohol Use: 0.0 oz/week    0 Standard drinks or equivalent per week     Comment: weekends  . Drug Use: No  . Sexual Activity: Not on file   Other Topics Concern  . Not on file   Social History Narrative   Regular exercise: 30 minutes 5 days a week    Past Surgical History  Procedure Laterality Date  . Endometrial ablation  2007    Novasure  . Tubal ligation  1983  . Tubal ligation      Family History  Problem Relation Age of Onset  . Diabetes Mother   . Heart disease Mother   . Cancer Mother 8    breast cancer  . Stroke Father   . Diabetes Father   . Heart disease Father     CABG, AVR, A-Fib  . Coronary artery disease Sister     Allergies  Allergen Reactions  . Hydrocodone-Acetaminophen     "made her skin crawl"  . Omni-Pac     vomitting  . Voltaren [Diclofenac]     diarrhea    Current Outpatient Prescriptions on File Prior to Visit  Medication Sig Dispense Refill  . cyclobenzaprine (FLEXERIL) 10 MG tablet TAKE ONE TABLET BY MOUTH TWICE DAILY AS NEEDED 60 tablet 0  . estradiol (ESTRACE) 0.5 MG tablet Take 0.5 mg by mouth daily.      . medroxyPROGESTERone (  PROVERA) 2.5 MG tablet Take 1 tablet by mouth daily.    . Multiple Vitamins-Minerals (MULTIVITAMIN WITH MINERALS) tablet 1 tablet.      . nitroGLYCERIN (NITRODUR - DOSED IN MG/24 HR) 0.2 mg/hr patch Apply 1/4th patch to affected achilles and change daily 30 patch 1  . oxyCODONE-acetaminophen (PERCOCET/ROXICET) 5-325 MG per tablet Take 1 tablet by mouth every 6 (six) hours as needed for severe pain. 40 tablet 0  . zolpidem (AMBIEN) 10 MG tablet Take 1/2 to 1 tab po qhs prn sleep 30 tablet 0   No current facility-administered medications on file prior to visit.    BP 104/80 mmHg  Pulse 70  Temp(Src) 98.9 F (37.2 C) (Oral)  Resp 16  Ht 5\' 4"  (1.626 m)  Wt 171 lb 6.4 oz (77.747 kg)  BMI 29.41 kg/m2  SpO2 99%    Objective:   Physical  Exam Physical Exam  Constitutional: She is oriented to person, place, and time. She appears well-developed and well-nourished. No distress.  HENT:  Head: Normocephalic and atraumatic.  Right Ear: Tympanic membrane and ear canal normal.  Left Ear: Tympanic membrane and ear canal normal.  Mouth/Throat: Oropharynx is clear and moist.  Eyes: Pupils are equal, round, and reactive to light. No scleral icterus.  Neck: Normal range of motion. No thyromegaly present.  Cardiovascular: Normal rate and regular rhythm.   No murmur heard. Pulmonary/Chest: Effort normal and breath sounds normal. No respiratory distress. He has no wheezes. She has no rales. She exhibits no tenderness.  Abdominal: Soft. Bowel sounds are normal. He exhibits no distension and no mass. There is no tenderness. There is no rebound and no guarding.  Musculoskeletal: She exhibits no edema.  Lymphadenopathy:    She has no cervical adenopathy.  Neurological: She is alert and oriented to person, place, and time. She has normal patellar reflexes. She exhibits normal muscle tone. Coordination normal.  Skin: Skin is warm and dry.  Psychiatric: She has a normal mood and affect. Her behavior is normal. Judgment and thought content normal.  Breasts: Examined lying Right: Without masses, retractions, discharge or axillary adenopathy.  Left: Without masses, retractions, discharge or axillary adenopathy.       Assessment & Plan:          Assessment & Plan:  EKG tracing is personally reviewed.  EKG notes NSR.  No acute changes.

## 2015-02-02 ENCOUNTER — Encounter: Payer: Self-pay | Admitting: Family

## 2015-02-02 DIAGNOSIS — D649 Anemia, unspecified: Secondary | ICD-10-CM

## 2015-02-02 LAB — ANA: Anti Nuclear Antibody(ANA): NEGATIVE

## 2015-02-02 NOTE — Telephone Encounter (Signed)
Add on form faxed to the lab. IFOB kit ordered and placed at front desk for pick up.

## 2015-02-02 NOTE — Telephone Encounter (Signed)
See mychart message.  Could you please ask lab to add on serum iron, b12, folate dx anemia and leave IFOB kit for pt at the front desk.

## 2015-02-11 NOTE — Telephone Encounter (Signed)
Lab unable to add additional iron studies. Sent mychart message to pt to call and schedule lab appt to complete work up and let us know if any questions.

## 2015-02-11 NOTE — Addendum Note (Signed)
Addended by: Kelle Darting A on: 02/11/2015 09:58 AM   Modules accepted: Orders

## 2015-02-22 ENCOUNTER — Other Ambulatory Visit: Payer: Self-pay | Admitting: Family

## 2015-02-23 NOTE — Telephone Encounter (Signed)
Pt's last rx, 01/21/15, #60. Last OV 01/30/15 and next OV 07/2015.  Please advise refills?

## 2015-02-26 ENCOUNTER — Ambulatory Visit: Payer: 59

## 2015-02-26 DIAGNOSIS — D649 Anemia, unspecified: Secondary | ICD-10-CM

## 2015-02-26 LAB — FOLATE: Folate: 13.4 ng/mL (ref >5.9)

## 2015-02-26 LAB — IRON: Iron: 82 ug/dL (ref 42–145)

## 2015-02-26 LAB — VITAMIN B12: VITAMIN B 12: 652 pg/mL (ref 211–911)

## 2015-02-28 ENCOUNTER — Encounter: Payer: Self-pay | Admitting: Family

## 2015-03-04 ENCOUNTER — Other Ambulatory Visit (INDEPENDENT_AMBULATORY_CARE_PROVIDER_SITE_OTHER): Payer: 59

## 2015-03-04 ENCOUNTER — Encounter: Payer: Self-pay | Admitting: Family

## 2015-03-04 DIAGNOSIS — D649 Anemia, unspecified: Secondary | ICD-10-CM | POA: Diagnosis not present

## 2015-03-04 LAB — FECAL OCCULT BLOOD, IMMUNOCHEMICAL: FECAL OCCULT BLD: NEGATIVE

## 2015-03-10 ENCOUNTER — Encounter: Payer: Self-pay | Admitting: Family

## 2015-03-10 MED ORDER — OMEPRAZOLE 40 MG PO CPDR: 40.0000 mg | DELAYED_RELEASE_CAPSULE | Freq: Every day | ORAL | Status: DC

## 2015-03-10 NOTE — Telephone Encounter (Signed)
Melissa-- Omeprazole 40mg  twice a day was on med list previously.  Please advise re: refills?

## 2015-03-23 ENCOUNTER — Other Ambulatory Visit: Payer: Self-pay | Admitting: Family Medicine

## 2015-03-23 NOTE — Telephone Encounter (Signed)
Melissa-  Please advise below request.  Looks like 30 was sent in with twice daily directions on 02/23/15.  Name from pharmacy:  In chart as:  CYCLOBENZAPR 10MG  TAB cyclobenzaprine (FLEXERIL) 10 MG tablet     Sig: TAKE ONE TABLET BY MOUTH TWICE DAILY AS NEEDED    Dispense: 30 tablet   Refills: 0   Start: 03/23/2015   Class: Normal    Requested on: 02/23/2015    Originally ordered on: 09/23/2011 02/23/2015

## 2015-04-08 ENCOUNTER — Encounter: Payer: Self-pay | Admitting: Family

## 2015-04-10 ENCOUNTER — Ambulatory Visit (HOSPITAL_BASED_OUTPATIENT_CLINIC_OR_DEPARTMENT_OTHER)
Admission: RE | Admit: 2015-04-10 | Discharge: 2015-04-10 | Disposition: A | Discharge status: Home / Self Care | Payer: 59 | Source: Ambulatory Visit | Attending: Family | Admitting: Family

## 2015-04-10 ENCOUNTER — Ambulatory Visit (INDEPENDENT_AMBULATORY_CARE_PROVIDER_SITE_OTHER): Payer: 59 | Admitting: Family

## 2015-04-10 ENCOUNTER — Encounter: Payer: Self-pay | Admitting: Family

## 2015-04-10 VITALS — BP 112/73 | HR 72 | Temp 98.0°F | Resp 16 | Ht 64.0 in | Wt 169.8 lb

## 2015-04-10 DIAGNOSIS — K219 Gastro-esophageal reflux disease without esophagitis: Secondary | ICD-10-CM | POA: Diagnosis not present

## 2015-04-10 DIAGNOSIS — R1013 Epigastric pain: Secondary | ICD-10-CM | POA: Insufficient documentation

## 2015-04-10 DIAGNOSIS — M26609 Unspecified temporomandibular joint disorder, unspecified side: Secondary | ICD-10-CM

## 2015-04-10 DIAGNOSIS — K760 Fatty (change of) liver, not elsewhere classified: Secondary | ICD-10-CM | POA: Insufficient documentation

## 2015-04-10 DIAGNOSIS — F411 Generalized anxiety disorder: Secondary | ICD-10-CM

## 2015-04-10 DIAGNOSIS — D1771 Benign lipomatous neoplasm of kidney: Secondary | ICD-10-CM | POA: Insufficient documentation

## 2015-04-10 DIAGNOSIS — R9431 Abnormal electrocardiogram [ECG] [EKG]: Secondary | ICD-10-CM

## 2015-04-10 DIAGNOSIS — Z23 Encounter for immunization: Secondary | ICD-10-CM

## 2015-04-10 LAB — CBC WITH DIFFERENTIAL/PLATELET
BASOS ABS: 0 10*3/uL (ref 0.0–0.1)
BASOS PCT: 0.5 % (ref 0.0–3.0)
EOS ABS: 0.1 10*3/uL (ref 0.0–0.7)
Eosinophils Relative: 2.1 % (ref 0.0–5.0)
HCT: 33.2 % — ABNORMAL LOW (ref 36.0–46.0)
HEMOGLOBIN: 11.4 g/dL — AB (ref 12.0–15.0)
Lymphocytes Relative: 35.6 % (ref 12.0–46.0)
Lymphs Abs: 2.4 10*3/uL (ref 0.7–4.0)
MCHC: 34.3 g/dL (ref 30.0–36.0)
MCV: 93.5 fl (ref 78.0–100.0)
MONO ABS: 0.4 10*3/uL (ref 0.1–1.0)
Monocytes Relative: 6 % (ref 3.0–12.0)
Neutro Abs: 3.8 10*3/uL (ref 1.4–7.7)
Neutrophils Relative %: 55.8 % (ref 43.0–77.0)
Platelets: 236 10*3/uL (ref 150.0–400.0)
RBC: 3.55 Mil/uL — ABNORMAL LOW (ref 3.87–5.11)
RDW: 13.5 % (ref 11.5–15.5)
WBC: 6.8 10*3/uL (ref 4.0–10.5)

## 2015-04-10 LAB — BASIC METABOLIC PANEL
BUN: 16 mg/dL (ref 6–23)
CHLORIDE: 108 meq/L (ref 96–112)
CO2: 27 mEq/L (ref 19–32)
Calcium: 9.1 mg/dL (ref 8.4–10.5)
Creatinine, Ser: 0.71 mg/dL (ref 0.40–1.20)
GFR: 91.1 mL/min (ref >60.00)
GLUCOSE: 96 mg/dL (ref 70–99)
POTASSIUM: 4 meq/L (ref 3.5–5.1)
SODIUM: 142 meq/L (ref 135–145)

## 2015-04-10 LAB — LIPASE: LIPASE: 26 U/L (ref 11.0–59.0)

## 2015-04-10 MED ORDER — PANTOPRAZOLE SODIUM 40 MG PO TBEC: 40.0000 mg | DELAYED_RELEASE_TABLET | Freq: Every day | ORAL | Status: DC

## 2015-04-10 MED ORDER — MELOXICAM 7.5 MG PO TABS: 7.5000 mg | ORAL_TABLET | Freq: Every day | ORAL | Status: DC

## 2015-04-10 NOTE — Patient Instructions (Addendum)
Stop tylenol, start meloxicam for TMJ. Continue flexeril at bedtime and wear your bite guard Stop prilosec, start protonix.   Work on [employer].   You will be contacted about your abdominal Ultrasound. Follow up in 1 month.    Food Choices for Gastroesophageal Reflux Disease, Adult When you have gastroesophageal reflux disease (GERD), the foods you eat and your eating habits are very important. Choosing the right foods can help ease the discomfort of GERD. WHAT GENERAL GUIDELINES DO I NEED TO FOLLOW?  Choose fruits, vegetables, whole grains, low-fat dairy products, and low-fat meat, fish, and poultry.  Limit fats such as oils, salad dressings, butter, nuts, and avocado.  Keep a food diary to identify foods that cause symptoms.  Avoid foods that cause reflux. These may be different for different people.  Eat frequent small meals instead of three large meals each day.  Eat your meals slowly, in a relaxed setting.  Limit fried foods.  Cook foods using methods other than frying.  Avoid drinking alcohol.  Avoid drinking large amounts of liquids with your meals.  Avoid bending over or lying down until 2-3 hours after eating. WHAT FOODS ARE NOT RECOMMENDED? The following are some foods and drinks that may worsen your symptoms: Vegetables Tomatoes. Tomato juice. Tomato and spaghetti sauce. Chili peppers. Onion and garlic. Horseradish. Fruits Oranges, grapefruit, and lemon (fruit and juice). Meats High-fat meats, fish, and poultry. This includes hot dogs, ribs, ham, sausage, salami, and bacon. Dairy Whole milk and chocolate milk. Sour cream. Cream. Butter. Ice cream. Cream cheese.  Beverages Coffee and tea, with or without caffeine. Carbonated beverages or energy drinks. Condiments Hot sauce. Barbecue sauce.  Sweets/Desserts Chocolate and cocoa. Donuts. Peppermint and spearmint. Fats and Oils High-fat foods, including [place] fries and potato chips. Other Vinegar. Strong  spices, such as black pepper, white pepper, red pepper, cayenne, curry powder, cloves, ginger, and chili powder. The items listed above may not be a complete list of foods and beverages to avoid. Contact your dietitian for more information.   This information is not intended to replace advice given to you by your health care provider. Make sure you discuss any questions you have with your health care provider.   Document Released: 06/20/2005 Document Revised: 07/11/2014 Document Reviewed: 04/24/2013 Elsevier Interactive Patient Education [employer].

## 2015-04-10 NOTE — Progress Notes (Signed)
Pre visit review using our clinic review tool, if applicable. No additional management support is needed unless otherwise documented below in the visit note. 

## 2015-04-10 NOTE — Progress Notes (Signed)
Subjective:    Patient ID: Haley Fuentes, female    DOB: August 21, 1960, 54 y.o.   MRN: [id number]  HPI  Haley Fuentes is a 54 yr old female who presents today for follow up.  1) GERD- pt has been on omeprazole x 1 month.  Continues to have reflux symptoms. Reports gerd pain which is worse after she eats. She reports that omeprazole has improved her pain, but "its still not good."   2) TMJ- recently flared- taking 8 tylenol a day. Broke a tooth because she has been clenching.  Using flexeril at bedtime.   Notes recent worsening anxiety. Daughter was hospitalized with kidney stones, anemia.  Husband has been sick, too much work recently. She is maintained on prozac.   Wt Readings from Last 3 Encounters:  04/10/15 169 lb 12.8 oz (77.021 kg)  01/30/15 171 lb 6.4 oz (77.747 kg)  10/17/14 170 lb (77.111 kg)     Review of Systems    see HPI  Past Medical History  Diagnosis Date  . Hemorrhoids   . Gastric polyp   . Depression   . Anemia     hx of iron deficient anemia  . Joint pain   . Postmenopausal     Social History   Social History  . Marital Status: Married    Spouse Name: N/A  . Number of Children: 2  . Years of Education: N/A   Occupational History  . [employer]    Social History Main Topics  . Smoking status: Former Smoker    Types: Cigarettes    Quit date: 07/04/1985  . Smokeless tobacco: Never Used  . Alcohol Use: 0.0 oz/week    0 Standard drinks or equivalent per week     Comment: weekends  . Drug Use: No  . Sexual Activity: Not on file   Other Topics Concern  . Not on file   Social History Narrative   Regular exercise: 30 minutes 5 days a week    Past Surgical History  Procedure Laterality Date  . Endometrial ablation  2007    Novasure  . Tubal ligation  1983  . Tubal ligation      Family History  Problem Relation Age of Onset  . Diabetes Mother   . Heart disease Mother   . Cancer Mother 61    breast cancer  . Stroke Father   . Diabetes  Father   . Heart disease Father     CABG, AVR, A-Fib  . Coronary artery disease Sister     Allergies  Allergen Reactions  . Hydrocodone-Acetaminophen     "made her skin crawl"  . Omni-Pac     vomitting  . Voltaren [Diclofenac]     diarrhea    Current Outpatient Prescriptions on File Prior to Visit  Medication Sig Dispense Refill  . cyclobenzaprine (FLEXERIL) 10 MG tablet TAKE ONE TABLET BY MOUTH TWICE DAILY AS NEEDED 30 tablet 0  . estradiol (ESTRACE) 0.5 MG tablet Take 0.5 mg by mouth daily.      Marland Kitchen FLUoxetine (PROZAC) 20 MG capsule Take 1 capsule (20 mg total) by mouth daily. 90 capsule 1  . lidocaine (LIDODERM) 5 % Place 1 patch onto the skin daily. Remove & Discard patch within 12 hours or as directed by MD 30 patch 0  . medroxyPROGESTERone (PROVERA) 2.5 MG tablet Take 1 tablet by mouth daily.    . Multiple Vitamins-Minerals (MULTIVITAMIN WITH MINERALS) tablet 1 tablet.      . nitroGLYCERIN (  NITRODUR - DOSED IN MG/24 HR) 0.2 mg/hr patch Apply 1/4th patch to affected achilles and change daily 30 patch 1  . oxyCODONE-acetaminophen (PERCOCET/ROXICET) 5-325 MG per tablet Take 1 tablet by mouth every 6 (six) hours as needed for severe pain. 40 tablet 0  . zolpidem (AMBIEN) 10 MG tablet Take 1/2 to 1 tab po qhs prn sleep 30 tablet 0   No current facility-administered medications on file prior to visit.    BP 112/73 mmHg  Pulse 72  Temp(Src) 98 F (36.7 C) (Oral)  Resp 16  Ht 5\' 4"  (1.626 m)  Wt 169 lb 12.8 oz (77.021 kg)  BMI 29.13 kg/m2  SpO2 98%    Objective:   Physical Exam  Constitutional: She is oriented to person, place, and time. She appears well-developed and well-nourished.  Cardiovascular: Normal rate, regular rhythm and normal heart sounds.   No murmur heard. Pulmonary/Chest: Effort normal and breath sounds normal. No respiratory distress. She has no wheezes.  Abdominal: Soft. Bowel sounds are normal. She exhibits no distension and no mass. There is no  tenderness. There is no rebound and no guarding.  Musculoskeletal:  + crepitus of TMJ noted  Neurological: She is alert and oriented to person, place, and time.  Skin: Skin is warm and dry.  Psychiatric: Her behavior is normal. Judgment and thought content normal.  Mildly irritable          Assessment & Plan:  Epigastric pain- US shows normal GB.  Incidental finding noted of fatty liver (see phone note).  Lipase, LFT normal. Discussed GERD diet,  D/c omeprazole, start pantoprazole.   Anxiety- largely situational. Continue prozac, if symptoms worsen or do not improve consider increase prozac dose.

## 2015-04-11 ENCOUNTER — Encounter: Payer: Self-pay | Admitting: Family

## 2015-04-11 DIAGNOSIS — M26609 Unspecified temporomandibular joint disorder, unspecified side: Secondary | ICD-10-CM | POA: Insufficient documentation

## 2015-04-11 NOTE — Assessment & Plan Note (Addendum)
Continue flexeril HS.  Add meloxicam  If no improvement, consider referral to oral surgeon.

## 2015-04-13 ENCOUNTER — Other Ambulatory Visit: Payer: Self-pay | Admitting: Family

## 2015-04-14 ENCOUNTER — Telehealth (HOSPITAL_COMMUNITY): Payer: Self-pay | Admitting: *Deleted

## 2015-04-14 ENCOUNTER — Other Ambulatory Visit: Payer: Self-pay

## 2015-04-14 ENCOUNTER — Telehealth: Payer: Self-pay | Admitting: Family

## 2015-04-14 DIAGNOSIS — R9431 Abnormal electrocardiogram [ECG] [EKG]: Secondary | ICD-10-CM | POA: Insufficient documentation

## 2015-04-14 MED ORDER — CYCLOBENZAPRINE HCL 10 MG PO TABS
10.0000 mg | ORAL_TABLET | Freq: Two times a day (BID) | ORAL | Status: DC | PRN
Start: 2015-04-14 — End: 2015-05-18

## 2015-04-14 NOTE — Telephone Encounter (Signed)
Melissa-- please advise request.  Last Rx #30 on 03/2015 and directions say twice daily if needed. Do we need to change quantity?

## 2015-04-14 NOTE — Assessment & Plan Note (Addendum)
EKG is reviewed and notes New TWI V2-V4  When compared to EKG 01/30/15. Will refer for stress test. See phone notes and referral notes.

## 2015-04-14 NOTE — Telephone Encounter (Signed)
Per verbal from PCP, unable to get nuclear stress test approved so order has been changed to exercise treadmill stress test. Notified pt and she voices understanding.

## 2015-04-14 NOTE — Telephone Encounter (Signed)
Patient given detailed instructions per Myocardial Perfusion Study Information Sheet for test on 04/16/15 at 0730. Patient notified to arrive 15 minutes early and that it is imperative to arrive on time for appointment to keep from having the test rescheduled.  If you need to cancel or reschedule your appointment, please call the office within 24 hours of your appointment. Failure to do so may result in a cancellation of your appointment, and a $50 no show fee. Patient verbalized understanding. Zoriana Oats, Ranae Palms

## 2015-04-14 NOTE — Addendum Note (Signed)
Addended by: [name] on: 04/14/2015 01:19 PM   Modules accepted: Orders

## 2015-04-16 ENCOUNTER — Encounter (HOSPITAL_COMMUNITY): Payer: 59

## 2015-04-20 ENCOUNTER — Other Ambulatory Visit: Payer: Self-pay | Admitting: Family

## 2015-04-20 MED ORDER — MELOXICAM 7.5 MG PO TABS: 7.5000 mg | ORAL_TABLET | Freq: Every day | ORAL | Status: DC

## 2015-04-20 NOTE — Telephone Encounter (Signed)
Haley Fuentes-- pt is requesting this Rx every 14 days and it appears we keep refilling it. Do we want to give her a greater quantity?

## 2015-04-27 ENCOUNTER — Encounter: Payer: Self-pay | Admitting: Gastroenterology

## 2015-05-04 ENCOUNTER — Other Ambulatory Visit: Payer: Self-pay | Admitting: Family

## 2015-05-04 NOTE — Telephone Encounter (Signed)
Pt has been getting Meloxicam every 14 days.  Do you want to increase her quantity to a 30 day supply and any refills?

## 2015-05-13 ENCOUNTER — Telehealth (HOSPITAL_COMMUNITY): Payer: Self-pay

## 2015-05-13 NOTE — Telephone Encounter (Signed)
Encounter complete. 

## 2015-05-14 ENCOUNTER — Encounter: Payer: Self-pay | Admitting: Family

## 2015-05-14 ENCOUNTER — Ambulatory Visit (HOSPITAL_COMMUNITY)
Admission: RE | Admit: 2015-05-14 | Discharge: 2015-05-14 | Disposition: A | Discharge status: Home / Self Care | Payer: 59 | Source: Ambulatory Visit | Attending: Cardiology | Admitting: Cardiology

## 2015-05-14 DIAGNOSIS — R1013 Epigastric pain: Secondary | ICD-10-CM | POA: Insufficient documentation

## 2015-05-14 DIAGNOSIS — R9431 Abnormal electrocardiogram [ECG] [EKG]: Secondary | ICD-10-CM | POA: Diagnosis not present

## 2015-05-14 DIAGNOSIS — K219 Gastro-esophageal reflux disease without esophagitis: Secondary | ICD-10-CM

## 2015-05-14 LAB — EXERCISE TOLERANCE TEST
CHL CUP RESTING HR STRESS: 66 {beats}/min
CSEPED: 9 min
CSEPHR: 94 %
Estimated workload: 10.6 METS
Exercise duration (sec): 20 s
MPHR: 166 {beats}/min
Peak HR: 157 {beats}/min
RPE: 16

## 2015-05-18 ENCOUNTER — Other Ambulatory Visit: Payer: Self-pay | Admitting: Family

## 2015-05-25 ENCOUNTER — Encounter: Payer: 59 | Attending: Family | Admitting: Dietician

## 2015-05-25 ENCOUNTER — Encounter: Payer: Self-pay | Admitting: Dietician

## 2015-05-25 VITALS — Ht 63.0 in | Wt 173.0 lb

## 2015-05-25 DIAGNOSIS — Z713 Dietary counseling and surveillance: Secondary | ICD-10-CM | POA: Insufficient documentation

## 2015-05-25 DIAGNOSIS — Z6830 Body mass index (BMI) 30.0-30.9, adult: Secondary | ICD-10-CM | POA: Diagnosis not present

## 2015-05-25 DIAGNOSIS — E669 Obesity, unspecified: Secondary | ICD-10-CM | POA: Diagnosis not present

## 2015-05-25 DIAGNOSIS — K219 Gastro-esophageal reflux disease without esophagitis: Secondary | ICD-10-CM | POA: Diagnosis present

## 2015-05-25 NOTE — Patient Instructions (Addendum)
Reduce your alcohol intake to 1 serving daily. Omit coffee for a week and see how you feel.   Continue to follow a bland diet (avoid spicy foods, black pepper, citrus, tomatoes) and experiment with new foods one at a time. Avoid fried foods, high fat food. Avoid overeating. Avoid laying down for 2-3 hours after you eat. Some form of exercise daily.  30 minutes most days of the week for health.  60 minutes most days of the week for weight loss. Consider trying: [employer] Rhizinate 3x ([place] chocolate) Be a mindful eater.   Eat slowly away from distraction (away from TV, computer)  Breakfast, lunch, dinner daily for nutrition  Small snacks when hungry  Ask:  Am I hungry and when am I full? Work/life balance, stress control

## 2015-05-25 NOTE — Progress Notes (Signed)
Medical Nutrition Therapy:  Appt start time: [id number] end time:  1630.   Assessment:  Primary concerns today: Patient is here alone.  Patient has been having GERD for years which has worsened over the past 6 months.  Started on medication which worked for a while then changed to [employer].  She is experiencing a lot of chest pain.   She has been checked for cardiac problems and pain has been associated to GERD. She also has increased belching.  She eats quickly with multiple distractions even when not hungry.  She relates a lack of self control at times. She uses increased amounts of alcohol on the weekends.  She has a list provided by the Nurse Practitioner but has not made changes at this time.  She would like to lose weight.  Her weight has been stable between 168-170 lbs recently.  She has tried My Fitness Pal in the past but quit due to frustration.  She lives with her husband and 4 dogs.  They eat out most of the time.  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  She works >12 hours per week for [employer] and once evening a week for [employer].  TANITA  BODY COMP RESULTS 05/25/15 170.5 lbs   BMI (kg/m^2) 30.2   Fat Mass (lbs) 69   Fat Free Mass (lbs) 101.5   Total Body Water (lbs) 74.5    Preferred Learning Style:   No preference indicated   Learning Readiness:   Contemplating  Ready  Change in progress  MEDICATIONS: see list   DIETARY INTAKE: Usual eating pattern includes 3 meals and 2 snacks per day. 24-hr recall:  B (7:30-8:00 AM): instant oatmeal and/or plain greek yogurt, fruit (berries or banana) OR smoothie (greek yogurt, fresh or frozen berries, almond or soy milk, spinach or kale, almonds, peanut butter powder or peanut butter, tumeric, flax seeds, cocoa, banana) or plain greek yogurt, and granola Snk ( AM): none  L (12:30PM):  Sandwich or leftovers or soup Snk ( PM): 1/2 apple and cheese OR 1/2  apple and nuts D ( PM): beef stew and roll, fish and chips (takes batter off) and green apple slaw, 1/2 scotch egg, Hard apple cider OR smoked meat, hush puppies, mac and cheese Snk ( PM): sometimes popcorn (homemade popcorn made with oil, added butter and salt) and white wine Beverages: water (>8 cups per day), 3-4 cups coffee per day with splenda, 4-5 servings of alcohol (beer, wine or hard cider) on Friday, Saturday, and Sunday, 2% milk,  rare ginger ale, occasional tea  Usual physical activity: not exercising currently.  Plans on "getting back on track".  Was doing a walk video or other walking 20-30 minutes per day.  Estimated energy needs: 1500 calories 70 grams g protein 50 g fat  Progress Towards Goal(s):  In progress.   Nutritional Diagnosis:  NB-1.1 Food and nutrition-related knowledge deficit As related to GERD and intuitive eating.  As evidenced by patient report.    Intervention:  Nutrition counseling/education related to GERD and intuitive eating.  Reduce your alcohol intake to 1 serving daily. Omit coffee for a week and see how you feel.   Continue to follow a bland diet (avoid spicy foods, black pepper, citrus, tomatoes) and experiment with new foods one at a time. Avoid fried foods, high fat food. Avoid overeating. Avoid laying down for 2-3 hours after you eat. Some form of exercise daily.  30 minutes most days of the week for health.  60 minutes most days of the week for weight loss. Consider trying: [employer] Rhizinate 3x (german chocolate) Be a mindful eater.   Eat slowly away from distraction (away from TV, computer)  Breakfast, lunch, dinner daily for nutrition  Small snacks when hungry  Ask:  Am I hungry and when am I full? Work/life balance, stress control  Teaching Method Utilized:  Visual Auditory Hands on  Handouts given during visit include:  GERD nutrition therapy  Barriers to learning/adherence to lifestyle change:  none  Demonstrated degree of understanding via:  Teach Back   Monitoring/Evaluation:  Dietary intake, exercise, and body weight in 6 week(s).

## 2015-07-10 ENCOUNTER — Ambulatory Visit: Payer: 59 | Admitting: Dietician

## 2015-07-13 ENCOUNTER — Encounter: Payer: Self-pay | Admitting: Family

## 2015-07-13 ENCOUNTER — Telehealth: Payer: Self-pay | Admitting: Family

## 2015-07-13 MED ORDER — ZOLPIDEM TARTRATE 10 MG PO TABS: ORAL_TABLET | ORAL | Status: DC

## 2015-07-13 MED ORDER — PANTOPRAZOLE SODIUM 40 MG PO TBEC: 40.0000 mg | DELAYED_RELEASE_TABLET | Freq: Every day | ORAL | Status: DC

## 2015-07-13 NOTE — Telephone Encounter (Signed)
Last zolpidem RX given 11/28/14, #30 x no refill. Rx printed and forwarded to PCP for signature.

## 2015-07-13 NOTE — Telephone Encounter (Signed)
Please advise cyclobenzaprine and meloxicam refills?

## 2015-07-13 NOTE — Telephone Encounter (Signed)
Rx faxed to pharmacy  

## 2015-07-14 MED ORDER — MELOXICAM 7.5 MG PO TABS: 7.5000 mg | ORAL_TABLET | Freq: Every day | ORAL | Status: DC

## 2015-07-14 MED ORDER — CYCLOBENZAPRINE HCL 10 MG PO TABS: 10.0000 mg | ORAL_TABLET | Freq: Two times a day (BID) | ORAL | Status: DC | PRN

## 2015-08-03 ENCOUNTER — Ambulatory Visit: Payer: 59 | Admitting: Family

## 2015-08-10 ENCOUNTER — Encounter: Payer: Self-pay | Admitting: Family

## 2015-09-07 ENCOUNTER — Encounter: Payer: Self-pay | Admitting: Family

## 2015-09-07 MED ORDER — MELOXICAM 7.5 MG PO TABS: 7.5000 mg | ORAL_TABLET | Freq: Every day | ORAL | Status: DC

## 2015-09-07 NOTE — Telephone Encounter (Signed)
Please advise 

## 2015-09-15 ENCOUNTER — Other Ambulatory Visit: Payer: Self-pay | Admitting: Family

## 2015-12-19 IMAGING — US US ABDOMEN COMPLETE
1 series · 13 of 25 positions shown · non-contrast
Comparison: Abdominal ultrasound March 16, 2011

CLINICAL DATA: Two month history of periumbilical and epigastric
discomfort, history of gastroesophageal reflux and nausea, known
angio- myolipoma of the right kidney.

EXAM:
ULTRASOUND ABDOMEN COMPLETE

[Series 1: us abdomen complete · 0.20mm/px · 13 of 88 slices shown]
[im 1/88]
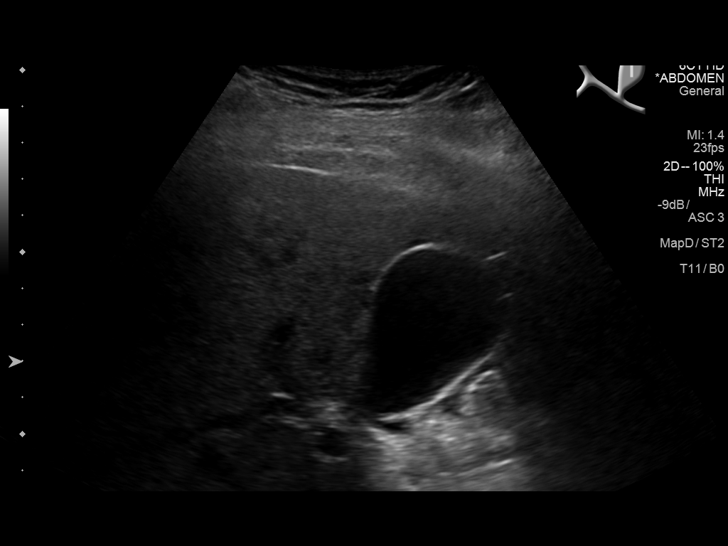
[im 8/88]
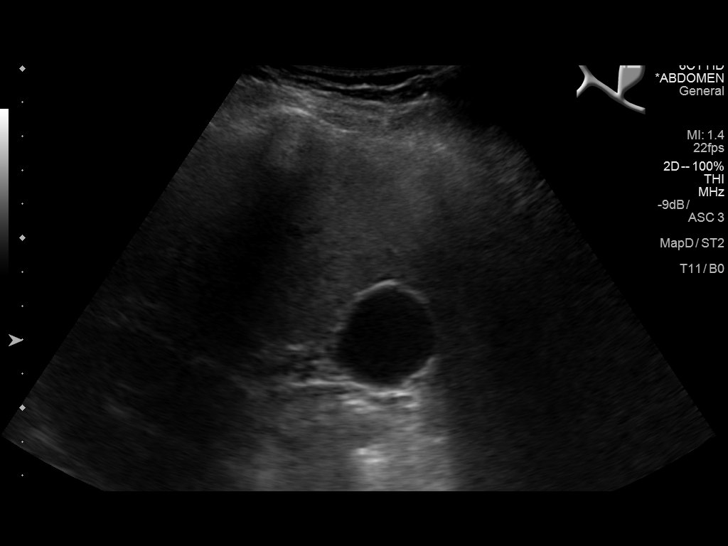
[im 15/88]
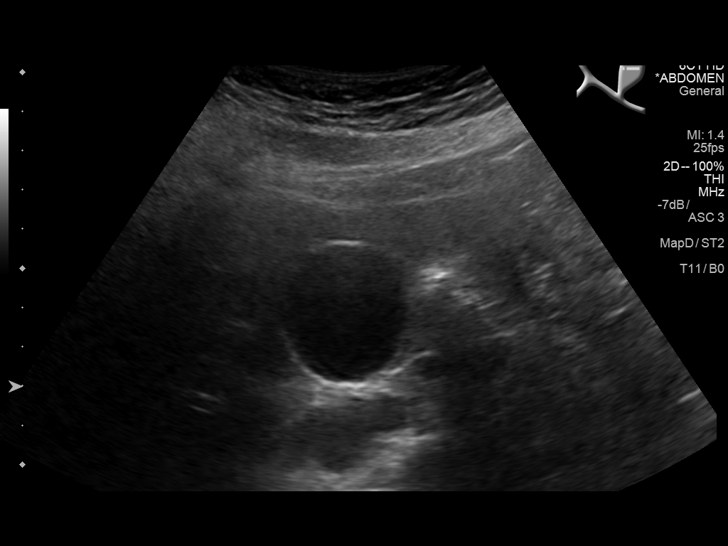
[im 22/88]
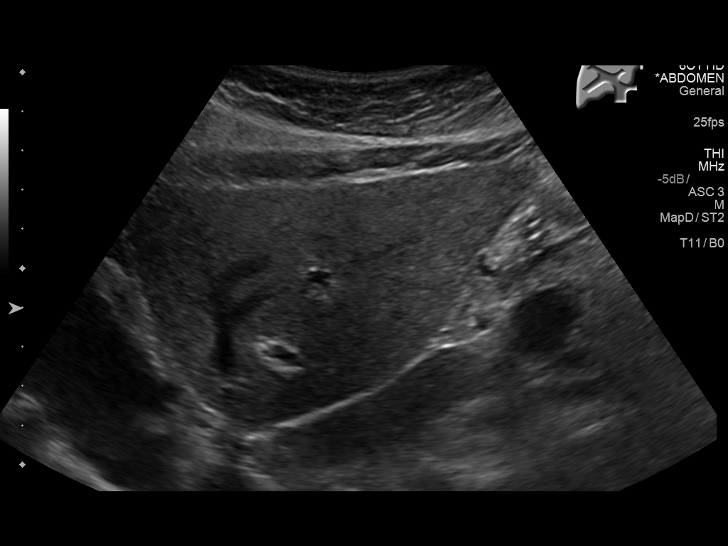
[im 30/88]
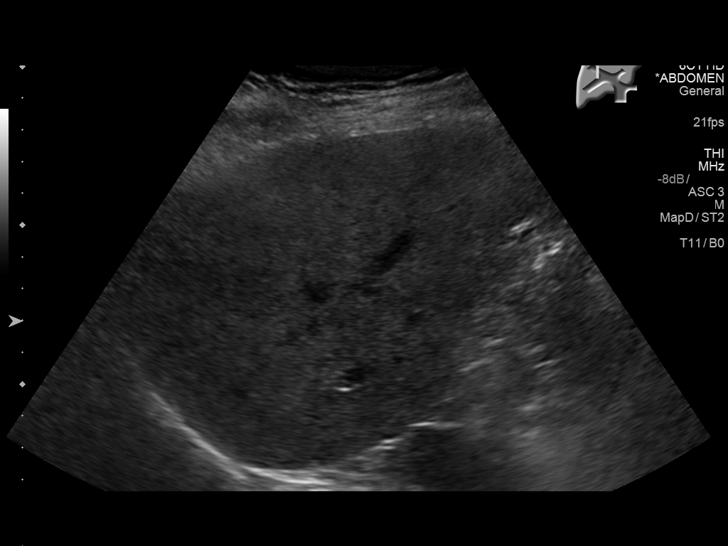
[im 37/88]
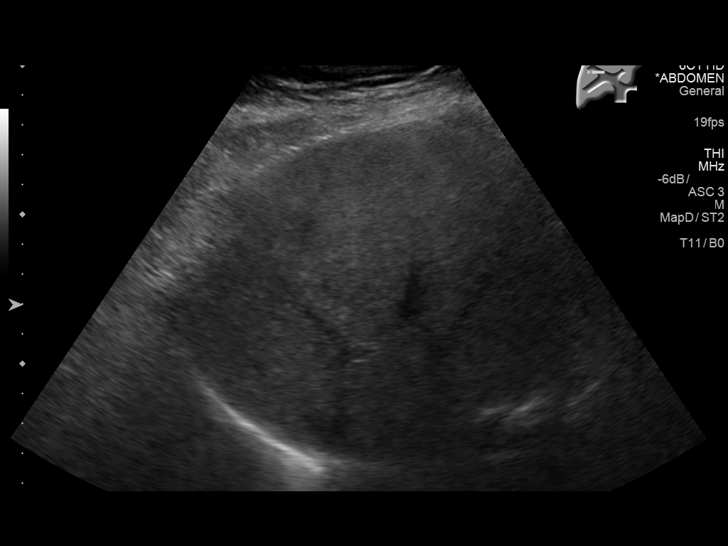
[im 44/88]
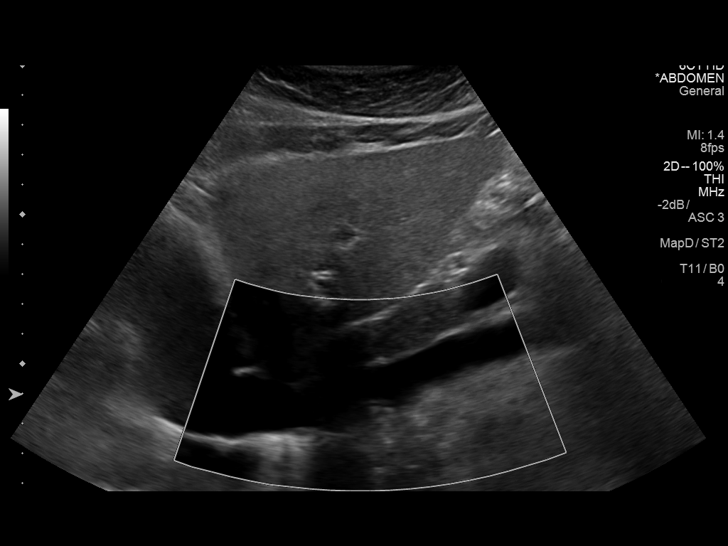
[im 51/88]
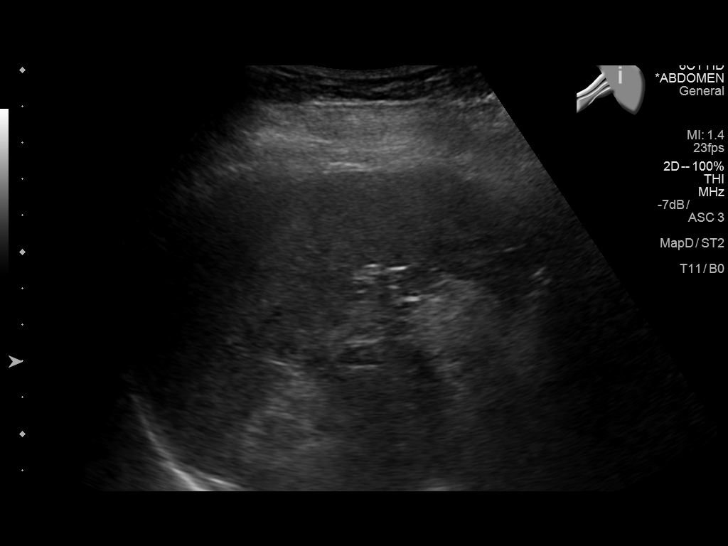
[im 59/88]
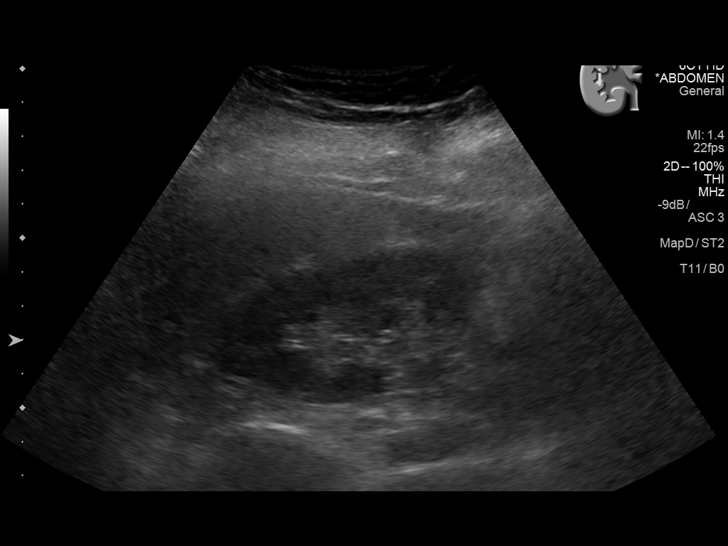
[im 66/88]
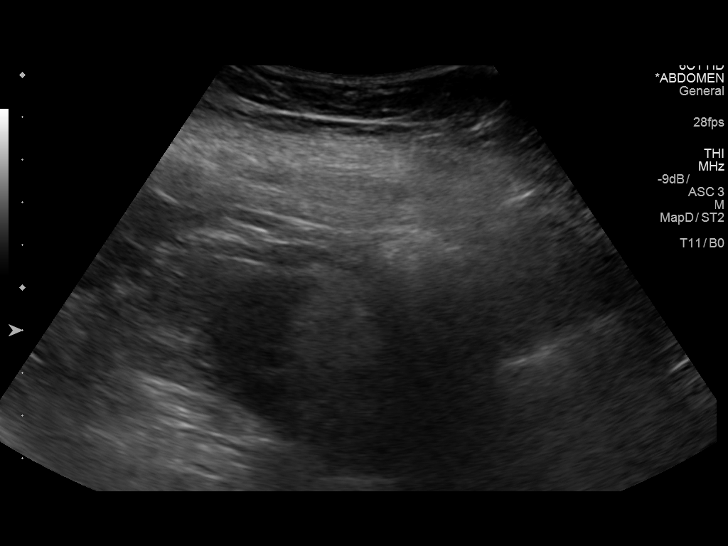
[im 73/88]
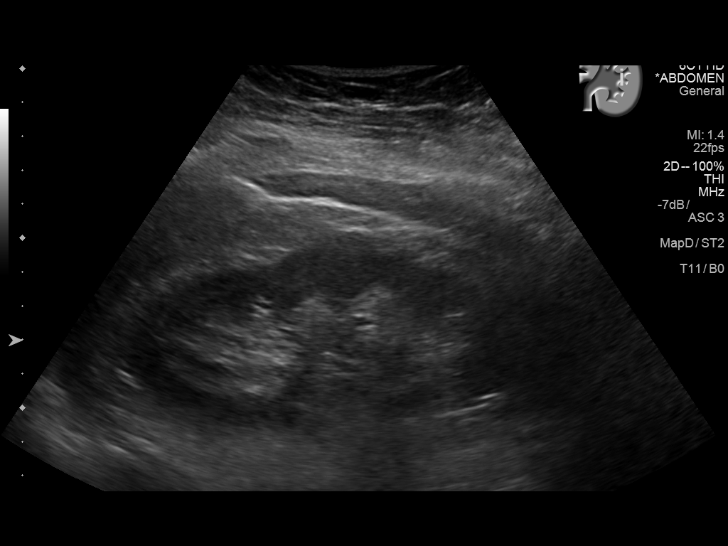
[im 80/88]
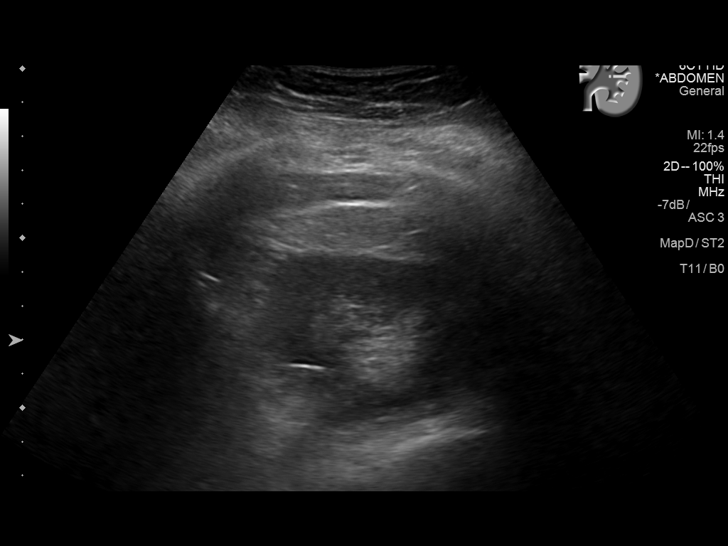
[im 88/88]
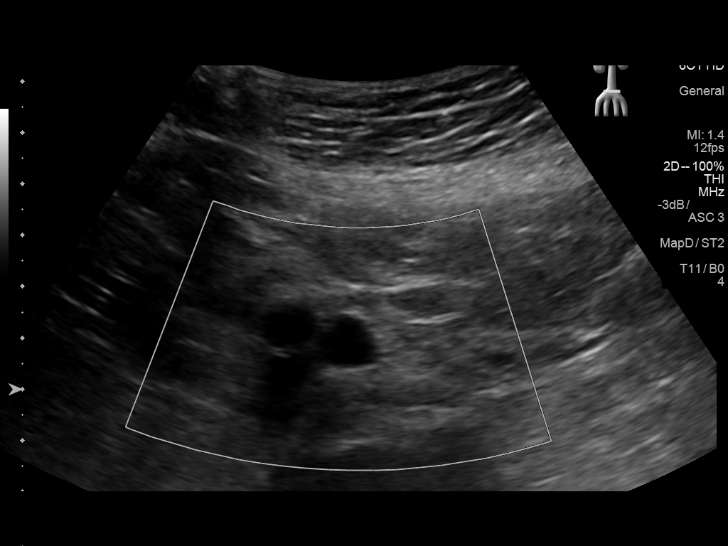

[13 of 25 positions shown; findings below may reference images not displayed]

FINDINGS: Gallbladder: No gallstones or wall thickening visualized. No
sonographic Murphy sign noted.

Common bile duct: Diameter: 4.7 mm

Liver: The hepatic echotexture is mildly increased diffusely. There
is no focal mass nor ductal dilation.

IVC: No abnormality visualized.

Pancreas: Bowel gas limits evaluation of the pancreatic body and
tail.

Spleen: Size and appearance within normal limits.

Right Kidney: Length: 12 cm. In the mid and lower aspect of the
right kidney there is an echogenic focus measuring 2.7 x 2.5 and
cm which has been previously demonstrated and is consistent with an
[REDACTED] lipoma.

Left Kidney: Length: 12.7 cm. Echogenicity within normal limits. No
mass or hydronephrosis visualized.

Abdominal aorta: No aneurysm visualized.

Other findings: There is no ascites.
IMPRESSION: 1. Fatty infiltrative change of the liver. No acute gallbladder
pathology. Limited evaluation of the pancreas.
2. The kidneys exhibit no acute abnormalities. A stable midpole
hyperechoic focus is consistent with a known [REDACTED] lipoma.
3. No acute intra-abdominal abnormality is demonstrated.

## 2015-12-21 ENCOUNTER — Other Ambulatory Visit: Payer: Self-pay | Admitting: Family

## 2015-12-30 ENCOUNTER — Encounter: Payer: Self-pay | Admitting: Family

## 2015-12-30 ENCOUNTER — Ambulatory Visit (INDEPENDENT_AMBULATORY_CARE_PROVIDER_SITE_OTHER): Payer: PRIVATE HEALTH INSURANCE | Admitting: Family

## 2015-12-30 VITALS — BP 110/80 | HR 65 | Temp 97.5°F | Resp 16 | Ht 64.0 in | Wt 147.0 lb

## 2015-12-30 DIAGNOSIS — F32A Depression, unspecified: Secondary | ICD-10-CM

## 2015-12-30 DIAGNOSIS — F329 Major depressive disorder, single episode, unspecified: Secondary | ICD-10-CM

## 2015-12-30 DIAGNOSIS — K219 Gastro-esophageal reflux disease without esophagitis: Secondary | ICD-10-CM | POA: Diagnosis not present

## 2015-12-30 DIAGNOSIS — M2550 Pain in unspecified joint: Secondary | ICD-10-CM

## 2015-12-30 MED ORDER — FLUOXETINE HCL 20 MG PO CAPS: 20.0000 mg | ORAL_CAPSULE | Freq: Every day | ORAL | Status: DC

## 2015-12-30 NOTE — Progress Notes (Signed)
Subjective:    Patient ID: Haley Fuentes, female    DOB: 02/18/1961, 55 y.o.   MRN: [id number]  HPI   Haley Fuentes is a 55 yr old female who presents today for follow up.  She has been working on weight loss and has lost 26 pounds since her last visit.  She has reduced the carbohydrates in her diet and is exercising more.  Has more energy, sleeping better.  Wt Readings from Last 3 Encounters:  12/30/15 147 lb (66.679 kg)  05/25/15 173 lb (78.472 kg)  04/10/15 169 lb 12.8 oz (77.021 kg)   GERD- not needing protonix.   Depression-  Maintained on fluoxetine 20mg .  Reports mood is good. Using ambien occasionally.    continues meloxicam elbow and neck pain.   Review of Systems    see HPI  Past Medical History  Diagnosis Date  . Hemorrhoids   . Gastric polyp   . Depression   . Anemia     hx of iron deficient anemia  . Joint pain   . Postmenopausal   . GERD (gastroesophageal reflux disease)      Social History   Social History  . Marital Status: Married    Spouse Name: N/A  . Number of Children: 2  . Years of Education: N/A   Occupational History  . [employer]    Social History Main Topics  . Smoking status: Former Smoker    Types: Cigarettes    Quit date: 07/04/1985  . Smokeless tobacco: Never Used  . Alcohol Use: 0.0 oz/week    0 Standard drinks or equivalent per week     Comment: weekends  . Drug Use: No  . Sexual Activity: Not on file   Other Topics Concern  . Not on file   Social History Narrative   Regular exercise: 30 minutes 5 days a week    Past Surgical History  Procedure Laterality Date  . Endometrial ablation  2007    Novasure  . Tubal ligation  1983  . Tubal ligation      Family History  Problem Relation Age of Onset  . Diabetes Mother   . Heart disease Mother   . Cancer Mother 42    breast cancer  . Stroke Father   . Diabetes Father   . Heart disease Father     CABG, AVR, A-Fib  . Coronary artery disease Sister     Allergies   Allergen Reactions  . Hydrocodone-Acetaminophen     "made her skin crawl"  . Omni-Pac     vomitting  . Voltaren [Diclofenac]     diarrhea    Current Outpatient Prescriptions on File Prior to Visit  Medication Sig Dispense Refill  . cyclobenzaprine (FLEXERIL) 10 MG tablet Take 1 tablet (10 mg total) by mouth 2 (two) times daily as needed. 30 tablet 2  . estradiol (ESTRACE) 0.5 MG tablet Take 0.5 mg by mouth daily.      Marland Kitchen FLUoxetine (PROZAC) 20 MG capsule TAKE ONE CAPSULE BY MOUTH ONCE DAILY 90 capsule 0  . lidocaine (LIDODERM) 5 % Place 1 patch onto the skin daily. Remove & Discard patch within 12 hours or as directed by MD 30 patch 0  . medroxyPROGESTERone (PROVERA) 2.5 MG tablet Take 1 tablet by mouth daily.    . meloxicam (MOBIC) 7.5 MG tablet TAKE ONE TABLET BY MOUTH ONCE DAILY 90 tablet 0  . Multiple Vitamins-Minerals (MULTIVITAMIN WITH MINERALS) tablet 1 tablet.      Marland Kitchen  nitroGLYCERIN (NITRODUR - DOSED IN MG/24 HR) 0.2 mg/hr patch Apply 1/4th patch to affected achilles and change daily 30 patch 1  . zolpidem (AMBIEN) 10 MG tablet Take 1/2 to 1 tab po qhs prn sleep 30 tablet 0   No current facility-administered medications on file prior to visit.    BP 110/80 mmHg  Pulse 65  Temp(Src) 97.5 F (36.4 C) (Oral)  Resp 16  Ht 5\' 4"  (1.626 m)  Wt 147 lb (66.679 kg)  BMI 25.22 kg/m2  SpO2 98%    Objective:   Physical Exam  Constitutional: She is oriented to person, place, and time. She appears well-developed and well-nourished.  HENT:  Head: Normocephalic and atraumatic.  Cardiovascular: Normal rate, regular rhythm and normal heart sounds.   No murmur heard. Pulmonary/Chest: Effort normal and breath sounds normal. No respiratory distress. She has no wheezes.  Musculoskeletal: She exhibits no edema.  Neurological: She is alert and oriented to person, place, and time.  Psychiatric: She has a normal mood and affect. Her behavior is normal. Judgment and thought content normal.            Assessment & Plan:

## 2015-12-30 NOTE — Progress Notes (Signed)
Pre visit review using our clinic review tool, if applicable. No additional management support is needed unless otherwise documented below in the visit note. 

## 2015-12-30 NOTE — Assessment & Plan Note (Signed)
Will see if she can tolerate coming off of meloxicam. Add tylenol prn.

## 2015-12-30 NOTE — Assessment & Plan Note (Signed)
Stable off of protonix. Continue dietary changes.

## 2015-12-30 NOTE — Patient Instructions (Addendum)
Please stop meloxicam (mobic).  Instead you can try using tylenol 1000mg  twice daily as needed. Great job with weight loss! Please schedule a complete physical at the front desk.

## 2015-12-30 NOTE — Assessment & Plan Note (Signed)
Stable on fluoxetine, continue same.

## 2016-01-11 ENCOUNTER — Other Ambulatory Visit: Payer: Self-pay | Admitting: Family

## 2016-01-12 NOTE — Telephone Encounter (Signed)
Requesting Cyclobenzaprine 10mg -Take 1 tablet by mouth twice daily as needed. Last refill:07/14/15;#30,2 Last OV:12/30/15 Please advise.//AB/CMA

## 2016-02-09 ENCOUNTER — Other Ambulatory Visit: Payer: Self-pay | Admitting: Obstetrics and Gynecology

## 2016-02-09 DIAGNOSIS — R928 Other abnormal and inconclusive findings on diagnostic imaging of breast: Secondary | ICD-10-CM

## 2016-02-15 ENCOUNTER — Ambulatory Visit
Admission: RE | Admit: 2016-02-15 | Discharge: 2016-02-15 | Disposition: A | Discharge status: Home / Self Care | Payer: PRIVATE HEALTH INSURANCE | Source: Ambulatory Visit | Attending: Obstetrics and Gynecology | Admitting: Obstetrics and Gynecology

## 2016-02-15 DIAGNOSIS — R928 Other abnormal and inconclusive findings on diagnostic imaging of breast: Secondary | ICD-10-CM

## 2016-03-18 ENCOUNTER — Encounter: Payer: Self-pay | Admitting: Family

## 2016-03-21 ENCOUNTER — Ambulatory Visit (INDEPENDENT_AMBULATORY_CARE_PROVIDER_SITE_OTHER): Payer: PRIVATE HEALTH INSURANCE | Admitting: Family

## 2016-03-21 ENCOUNTER — Encounter: Payer: Self-pay | Admitting: Family

## 2016-03-21 VITALS — BP 104/67 | HR 72 | Temp 98.2°F | Resp 18 | Ht 64.0 in | Wt 146.2 lb

## 2016-03-21 DIAGNOSIS — Z23 Encounter for immunization: Secondary | ICD-10-CM

## 2016-03-21 DIAGNOSIS — Z0000 Encounter for general adult medical examination without abnormal findings: Secondary | ICD-10-CM | POA: Diagnosis not present

## 2016-03-21 LAB — CBC WITH DIFFERENTIAL/PLATELET
BASOS PCT: 0.5 % (ref 0.0–3.0)
Basophils Absolute: 0 10*3/uL (ref 0.0–0.1)
EOS ABS: 0.1 10*3/uL (ref 0.0–0.7)
EOS PCT: 2.1 % (ref 0.0–5.0)
HCT: 35.5 % — ABNORMAL LOW (ref 36.0–46.0)
Hemoglobin: 12.1 g/dL (ref 12.0–15.0)
LYMPHS ABS: 1.9 10*3/uL (ref 0.7–4.0)
Lymphocytes Relative: 26.9 % (ref 12.0–46.0)
MCHC: 34.2 g/dL (ref 30.0–36.0)
MCV: 92.9 fl (ref 78.0–100.0)
MONO ABS: 0.4 10*3/uL (ref 0.1–1.0)
Monocytes Relative: 5.6 % (ref 3.0–12.0)
NEUTROS PCT: 64.9 % (ref 43.0–77.0)
Neutro Abs: 4.6 10*3/uL (ref 1.4–7.7)
PLATELETS: 246 10*3/uL (ref 150.0–400.0)
RBC: 3.82 Mil/uL — ABNORMAL LOW (ref 3.87–5.11)
RDW: 13.2 % (ref 11.5–15.5)
WBC: 7.2 10*3/uL (ref 4.0–10.5)

## 2016-03-21 LAB — URINALYSIS, ROUTINE W REFLEX MICROSCOPIC
Bilirubin Urine: NEGATIVE
Hgb urine dipstick: NEGATIVE
KETONES UR: NEGATIVE
NITRITE: NEGATIVE
PH: 6 (ref 5.0–8.0)
SPECIFIC GRAVITY, URINE: 1.015 (ref 1.000–1.030)
TOTAL PROTEIN, URINE-UPE24: NEGATIVE
URINE GLUCOSE: NEGATIVE
UROBILINOGEN UA: 0.2 (ref 0.0–1.0)

## 2016-03-21 LAB — BASIC METABOLIC PANEL
BUN: 23 mg/dL (ref 6–23)
CHLORIDE: 104 meq/L (ref 96–112)
CO2: 29 mEq/L (ref 19–32)
CREATININE: 0.58 mg/dL (ref 0.40–1.20)
Calcium: 9.4 mg/dL (ref 8.4–10.5)
GFR: 114.65 mL/min (ref >60.00)
GLUCOSE: 90 mg/dL (ref 70–99)
POTASSIUM: 3.8 meq/L (ref 3.5–5.1)
Sodium: 140 mEq/L (ref 135–145)

## 2016-03-21 LAB — HEPATIC FUNCTION PANEL
ALT: 23 U/L (ref 0–35)
AST: 17 U/L (ref 0–37)
Albumin: 4.2 g/dL (ref 3.5–5.2)
Alkaline Phosphatase: 63 U/L (ref 39–117)
BILIRUBIN DIRECT: 0 mg/dL (ref 0.0–0.3)
BILIRUBIN TOTAL: 0.6 mg/dL (ref 0.2–1.2)
Total Protein: 6.9 g/dL (ref 6.0–8.3)

## 2016-03-21 LAB — TSH: TSH: 1.6 u[IU]/mL (ref 0.35–4.50)

## 2016-03-21 LAB — LIPID PANEL
CHOLESTEROL: 260 mg/dL — AB (ref 0–200)
HDL: 57.6 mg/dL (ref >39.00)
LDL CALC: 167 mg/dL — AB (ref 0–99)
NONHDL: 202.18
TRIGLYCERIDES: 174 mg/dL — AB (ref 0.0–149.0)
Total CHOL/HDL Ratio: 5
VLDL: 34.8 mg/dL (ref 0.0–40.0)

## 2016-03-21 NOTE — Progress Notes (Signed)
Subjective:    Patient ID: Haley Fuentes, female    DOB: 1961-06-20, 55 y.o.   MRN: [id number]  HPI  Patient presents today for complete physical.  Immunizations: Tetanus is up to date, flu shot today.  Diet:healthy Exercise: some exercise Wt Readings from Last 3 Encounters:  03/21/16 146 lb 3.2 oz (66.3 kg)  12/30/15 147 lb (66.7 kg)  05/25/15 173 lb (78.5 kg)  Colonoscopy: 1/12- normal per patient Dexa: not done will let me know if is she has done.  Pap Smear: 5/15 (GYN) Mammogram:8/17, due for repeat 8/18  Has a bite guard but is not using it.    Review of Systems  Constitutional: Negative for unexpected weight change.  HENT:       R ear feels like canal is "closing up."  She has had some jaw pain on the right.   Eyes: Negative for visual disturbance.  Respiratory: Negative for cough.   Cardiovascular: Negative for leg swelling.  Genitourinary: Negative for dysuria and frequency.  Musculoskeletal: Negative for arthralgias and myalgias.  Skin: Negative for rash.  Neurological: Negative for headaches.  Hematological: Negative for adenopathy.  Psychiatric/Behavioral:       Denies depression/anxiety   Past Medical History:  Diagnosis Date  . Anemia    hx of iron deficient anemia  . Depression   . Gastric polyp   . GERD (gastroesophageal reflux disease)   . Hemorrhoids   . Joint pain   . Postmenopausal      Social History   Social History  . Marital status: Married    Spouse name: N/A  . Number of children: 2  . Years of education: N/A   Occupational History  . [employer] [place]   Social History Main Topics  . Smoking status: Former Smoker    Types: Cigarettes    Quit date: 07/04/1985  . Smokeless tobacco: Never Used  . Alcohol use 0.0 oz/week     Comment: weekends  . Drug use: No  . Sexual activity: Not on file   Other Topics Concern  . Not on file   Social History Narrative   Regular exercise: 30 minutes 5 days a week    Past  Surgical History:  Procedure Laterality Date  . ENDOMETRIAL ABLATION  2007   Novasure  . TUBAL LIGATION  1983  . TUBAL LIGATION      Family History  Problem Relation Age of Onset  . Diabetes Mother   . Heart disease Mother   . Cancer Mother 26    breast cancer  . Stroke Father   . Diabetes Father   . Heart disease Father     CABG, AVR, A-Fib    Allergies  Allergen Reactions  . Hydrocodone-Acetaminophen     "made her skin crawl"  . Omni-Pac     vomitting  . Voltaren [Diclofenac]     diarrhea    Current Outpatient Prescriptions on File Prior to Visit  Medication Sig Dispense Refill  . cyclobenzaprine (FLEXERIL) 10 MG tablet TAKE ONE TABLET BY MOUTH TWICE DAILY AS NEEDED 30 tablet 3  . estradiol (ESTRACE) 0.5 MG tablet Take 0.5 mg by mouth daily.      Marland Kitchen FLUoxetine (PROZAC) 20 MG capsule Take 1 capsule (20 mg total) by mouth daily. 90 capsule 1  . lidocaine (LIDODERM) 5 % Place 1 patch onto the skin daily. Remove & Discard patch within 12 hours or as directed by MD 30 patch 0  . medroxyPROGESTERone (PROVERA)  2.5 MG tablet Take 1 tablet by mouth daily.    . Multiple Vitamins-Minerals (MULTIVITAMIN WITH MINERALS) tablet 1 tablet.      . nitroGLYCERIN (NITRODUR - DOSED IN MG/24 HR) 0.2 mg/hr patch Apply 1/4th patch to affected achilles and change daily 30 patch 1  . zolpidem (AMBIEN) 10 MG tablet Take 1/2 to 1 tab po qhs prn sleep 30 tablet 0   No current facility-administered medications on file prior to visit.     BP 104/67   Pulse 72   Temp 98.2 F (36.8 C) (Oral)   Resp 18   Ht 5\' 4"  (1.626 m)   Wt 146 lb 3.2 oz (66.3 kg)   SpO2 98% Comment: room air  BMI 25.10 kg/m       Objective:   Physical Exam   Physical Exam  Constitutional: She is oriented to person, place, and time. She appears well-developed and well-nourished. No distress.  HENT:  Head: Normocephalic and atraumatic.  Right Ear: Tympanic membrane and ear canal normal.  Left Ear: Tympanic  membrane and ear canal normal.  Mouth/Throat: Oropharynx is clear and moist.  Eyes: Pupils are equal, round, and reactive to light. No scleral icterus.  Neck: Normal range of motion. No thyromegaly present.  Cardiovascular: Normal rate and regular rhythm.   No murmur heard. Pulmonary/Chest: Effort normal and breath sounds normal. No respiratory distress. He has no wheezes. She has no rales. She exhibits no tenderness.  Abdominal: Soft. Bowel sounds are normal. She exhibits no distension and no mass. There is no tenderness. There is no rebound and no guarding.  Musculoskeletal: She exhibits no edema.  Lymphadenopathy:    She has no cervical adenopathy.  Neurological: She is alert and oriented to person, place, and time. She has normal patellar reflexes. She exhibits normal muscle tone. Coordination normal.  Skin: Skin is warm and dry.  Psychiatric: She has a normal mood and affect. Her behavior is normal. Judgment and thought content normal.  Breasts: Examined lying Right: Without masses, retractions, discharge or axillary adenopathy.  Left: Without masses, retractions, discharge or axillary adenopathy.            Assessment & Plan:        Assessment & Plan:  Preventative care- commended patient on her weight loss. Obtain routine lab work. She will check with her GYN if she had a bone density. Continue exercise. Flu shot today.

## 2016-03-21 NOTE — Progress Notes (Signed)
Pre visit review using our clinic review tool, if applicable. No additional management support is needed unless otherwise documented below in the visit note. 

## 2016-03-21 NOTE — Patient Instructions (Addendum)
Continue healthy diet and exercise. Complete lab work prior to leaving. You may schedule a nurse visit for an EKG at your convenience.

## 2016-03-22 ENCOUNTER — Encounter: Payer: Self-pay | Admitting: Family

## 2016-04-05 ENCOUNTER — Encounter: Payer: Self-pay | Admitting: Family

## 2016-05-12 ENCOUNTER — Other Ambulatory Visit: Payer: Self-pay | Admitting: Family

## 2016-05-14 ENCOUNTER — Other Ambulatory Visit: Payer: Self-pay | Admitting: Family

## 2016-05-16 NOTE — Telephone Encounter (Signed)
Last filled 01/13/16, #30, 3 RF.

## 2016-09-19 ENCOUNTER — Ambulatory Visit (INDEPENDENT_AMBULATORY_CARE_PROVIDER_SITE_OTHER): Payer: PRIVATE HEALTH INSURANCE | Admitting: Family

## 2016-09-19 ENCOUNTER — Encounter: Payer: Self-pay | Admitting: Family

## 2016-09-19 DIAGNOSIS — G4700 Insomnia, unspecified: Secondary | ICD-10-CM

## 2016-09-19 DIAGNOSIS — F329 Major depressive disorder, single episode, unspecified: Secondary | ICD-10-CM

## 2016-09-19 DIAGNOSIS — M26609 Unspecified temporomandibular joint disorder, unspecified side: Secondary | ICD-10-CM

## 2016-09-19 DIAGNOSIS — E785 Hyperlipidemia, unspecified: Secondary | ICD-10-CM | POA: Diagnosis not present

## 2016-09-19 DIAGNOSIS — F32A Depression, unspecified: Secondary | ICD-10-CM

## 2016-09-19 MED ORDER — FLUOXETINE HCL 20 MG PO CAPS: 20.0000 mg | ORAL_CAPSULE | Freq: Every day | ORAL | 1 refills | Status: DC

## 2016-09-19 MED ORDER — LIDOCAINE 5 % EX PTCH: 1.0000 | MEDICATED_PATCH | CUTANEOUS | 0 refills | Status: DC

## 2016-09-19 MED ORDER — ZOLPIDEM TARTRATE 10 MG PO TABS: ORAL_TABLET | ORAL | 0 refills | Status: DC

## 2016-09-19 NOTE — Assessment & Plan Note (Signed)
Fair control. Continue prn ambien.

## 2016-09-19 NOTE — Patient Instructions (Addendum)
Consider massage therapy for neck/jaw tightness. Consider establishing with a therapist for counseling. Try to add regular exercise.   Continue current dose of prozac. Continue to work on a low fat/low cholesterol diet.

## 2016-09-19 NOTE — Progress Notes (Signed)
Subjective:    Patient ID: Haley Fuentes, female    DOB: 1960/10/25, 56 y.o.   MRN: [id number]  HPI  Haley Fuentes is a 56 yr old female who presents today for follow up.  Depression- currently maintained on prozac.  She use ambien prn.  Occasionally will use aleve PM.  Reports that she wakes up at 3-4 PM and can't get back to sleep.  Husband recently retired. This is putting new pressures on her. Work has been stressful. Extended family is putting pressures on her to visit them.    Hyperlipidemia-  Lab Results  Component Value Date   CHOL 260 (H) 03/21/2016   HDL 57.60 03/21/2016   LDLCALC 167 (H) 03/21/2016   LDLDIRECT 143 (H) 10/21/2009   TRIG 174.0 (H) 03/21/2016   CHOLHDL 5 03/21/2016   Has issues with TMJ.  Using a tens devise on her back which is helping.  Not using her bite guard. Has a lot of tension in her neck/shoulders and jaw.    Review of Systems See HPI  Past Medical History:  Diagnosis Date  . Anemia    hx of iron deficient anemia  . Depression   . Gastric polyp   . GERD (gastroesophageal reflux disease)   . Hemorrhoids   . Joint pain   . Postmenopausal      Social History   Social History  . Marital status: Married    Spouse name: N/A  . Number of children: 2  . Years of education: N/A   Occupational History  . [employer]   Social History Main Topics  . Smoking status: Former Smoker    Types: Cigarettes    Quit date: 07/04/1985  . Smokeless tobacco: Never Used  . Alcohol use 0.0 oz/week     Comment: weekends  . Drug use: No  . Sexual activity: Not on file   Other Topics Concern  . 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          Past Surgical History:  Procedure Laterality Date  .  ENDOMETRIAL ABLATION  2007   Novasure  . TUBAL LIGATION  1983  . TUBAL LIGATION      Family History  Problem Relation Age of Onset  . Diabetes Mother   . Heart disease Mother   . Cancer Mother 59    breast cancer  . Stroke Father   . Diabetes Father   . Heart disease Father     CABG, AVR, A-Fib    Allergies  Allergen Reactions  . Hydrocodone-Acetaminophen     "made her skin crawl"  . Omni-Pac     vomitting  . Voltaren [Diclofenac]     diarrhea    Current Outpatient Prescriptions on File Prior to Visit  Medication Sig Dispense Refill  . cyclobenzaprine (FLEXERIL) 10 MG tablet TAKE ONE TABLET BY MOUTH TWICE DAILY AS NEEDED 30 tablet 3  . ESTRACE VAGINAL 0.1 MG/GM vaginal cream Place 1 application vaginally at bedtime.  1  . estradiol (ESTRACE) 0.5 MG tablet Take 0.5 mg by mouth daily.      Marland Kitchen FLUoxetine (PROZAC) 20 MG capsule Take 1 capsule (20 mg total) by mouth daily. 90 capsule 1  .  lidocaine (LIDODERM) 5 % Place 1 patch onto the skin daily. Remove & Discard patch within 12 hours or as directed by MD 30 patch 0  . medroxyPROGESTERone (PROVERA) 2.5 MG tablet Take 1 tablet by mouth daily.    . Multiple Vitamins-Minerals (MULTIVITAMIN WITH MINERALS) tablet 1 tablet.      Marland Kitchen zolpidem (AMBIEN) 10 MG tablet TAKE ONE-HALF TO ONE TABLET BY MOUTH AT BEDTIME AS NEEDED FOR SLEEP 15 tablet 0   No current facility-administered medications on file prior to visit.     BP 106/73 (BP Location: Right Arm, Cuff Size: Normal)   Pulse 70   Temp 97.7 F (36.5 C) (Oral)   Resp 16   Ht 5\' 4"  (1.626 m)   Wt 151 lb 12.8 oz (68.9 kg)   SpO2 100% Comment: room air  BMI 26.06 kg/m       Objective:   Physical Exam  Constitutional: She is oriented to person, place, and time. She appears well-developed and well-nourished.  HENT:  Head: Normocephalic and atraumatic.  Cardiovascular: Normal rate, regular rhythm and normal heart sounds.   No murmur heard. Pulmonary/Chest: Effort normal and  breath sounds normal. No respiratory distress. She has no wheezes.  Musculoskeletal: She exhibits no edema.  Neurological: She is alert and oriented to person, place, and time.  Skin: Skin is warm and dry.  Psychiatric: She has a normal mood and affect. Her behavior is normal. Judgment and thought content normal.          Assessment & Plan:

## 2016-09-19 NOTE — Progress Notes (Signed)
Pre visit review using our clinic review tool, if applicable. No additional management support is needed unless otherwise documented below in the visit note. 

## 2016-09-19 NOTE — Assessment & Plan Note (Signed)
Discussed wearing her bite guard, consider massage for neck/back pain and tension.

## 2016-09-19 NOTE — Assessment & Plan Note (Signed)
Uncontrolled, but not severe enough to warrant statin. Continue low fat/low cholesterol diet.

## 2016-09-19 NOTE — Assessment & Plan Note (Signed)
She scored 10 on PHQ-9.  I think that stress is a bigger problem for her than depression however.  We discussed establishing with a therapist and adding regular exercise for stress relief.

## 2016-09-20 ENCOUNTER — Other Ambulatory Visit: Payer: Self-pay | Admitting: Family

## 2017-02-13 ENCOUNTER — Other Ambulatory Visit: Payer: Self-pay | Admitting: Family

## 2017-02-27 ENCOUNTER — Other Ambulatory Visit: Payer: Self-pay | Admitting: Family

## 2017-02-27 NOTE — Telephone Encounter (Signed)
Last cyclobenzaprine RX:09/20/16, #30x 3 refills Last OV: 01/27/17 Next OV: 03/22/17 UDS: no previous UDS on file.  Request routed to PCP for approval / denial.

## 2017-03-14 DIAGNOSIS — Z713 Dietary counseling and surveillance: Secondary | ICD-10-CM | POA: Diagnosis not present

## 2017-03-22 ENCOUNTER — Ambulatory Visit (INDEPENDENT_AMBULATORY_CARE_PROVIDER_SITE_OTHER): Payer: BLUE CROSS/BLUE SHIELD | Admitting: Family

## 2017-03-22 ENCOUNTER — Encounter: Payer: Self-pay | Admitting: Family

## 2017-03-22 VITALS — BP 115/78 | HR 60 | Temp 98.1°F | Resp 16 | Ht 64.0 in | Wt 158.4 lb

## 2017-03-22 DIAGNOSIS — E785 Hyperlipidemia, unspecified: Secondary | ICD-10-CM | POA: Diagnosis not present

## 2017-03-22 DIAGNOSIS — Z23 Encounter for immunization: Secondary | ICD-10-CM

## 2017-03-22 DIAGNOSIS — Z124 Encounter for screening for malignant neoplasm of cervix: Secondary | ICD-10-CM | POA: Diagnosis not present

## 2017-03-22 DIAGNOSIS — G4700 Insomnia, unspecified: Secondary | ICD-10-CM

## 2017-03-22 DIAGNOSIS — F32A Depression, unspecified: Secondary | ICD-10-CM

## 2017-03-22 DIAGNOSIS — Z6828 Body mass index (BMI) 28.0-28.9, adult: Secondary | ICD-10-CM | POA: Diagnosis not present

## 2017-03-22 DIAGNOSIS — Z01419 Encounter for gynecological examination (general) (routine) without abnormal findings: Secondary | ICD-10-CM | POA: Diagnosis not present

## 2017-03-22 DIAGNOSIS — F329 Major depressive disorder, single episode, unspecified: Secondary | ICD-10-CM | POA: Diagnosis not present

## 2017-03-22 DIAGNOSIS — Z1231 Encounter for screening mammogram for malignant neoplasm of breast: Secondary | ICD-10-CM | POA: Diagnosis not present

## 2017-03-22 LAB — HM PAP SMEAR: HM Pap smear: NEGATIVE

## 2017-03-22 MED ORDER — FLUOXETINE HCL 40 MG PO CAPS: 40.0000 mg | ORAL_CAPSULE | Freq: Every day | ORAL | 1 refills | Status: DC

## 2017-03-22 MED ORDER — ZOLPIDEM TARTRATE 10 MG PO TABS: ORAL_TABLET | ORAL | 0 refills | Status: DC

## 2017-03-22 NOTE — Patient Instructions (Signed)
Complete lab work prior to leaving. Increase prozac to 20mg  once daily.

## 2017-03-22 NOTE — Progress Notes (Signed)
Subjective:    Patient ID: Haley Fuentes, female    DOB: 1961/03/02, 56 y.o.   MRN: [id number]  HPI  Haley Fuentes is a 56 yr old female who presents today for follow up.  Depression- last visit she described significant stress with her husband recently retiring. We discussed establishing with a therapist and try and add some regular exercise for stress relief. She was continued on her previous dose of Prozac. Still having stress.  Mother has been sick.  Husband had cardiac cath.    Insomnia-she continues as needed Ambien.     Hyperlipidemia-  Lab Results  Component Value Date   CHOL 260 (H) 03/21/2016   HDL 57.60 03/21/2016   LDLCALC 167 (H) 03/21/2016   LDLDIRECT 143 (H) 10/21/2009   TRIG 174.0 (H) 03/21/2016   CHOLHDL 5 03/21/2016     Review of Systems See HPI  Past Medical History:  Diagnosis Date  . Anemia    hx of iron deficient anemia  . Depression   . Gastric polyp   . GERD (gastroesophageal reflux disease)   . Hemorrhoids   . Joint pain   . Postmenopausal      Social History   Social History  . Marital status: Married    Spouse name: N/A  . Number of children: 2  . Years of education: N/A   Occupational History  . 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 Smoking status: Former Smoker    Types: Cigarettes    Quit date: 07/04/1985  . Smokeless tobacco: Never Used  . Alcohol use 0.0 oz/week     Comment: weekends  . Drug use: No  . Sexual activity: Not on file   Other Topics Concern  . 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          Past Surgical History:  Procedure Laterality Date  . ENDOMETRIAL ABLATION  2007   Novasure  . TUBAL LIGATION  1983  . TUBAL LIGATION      Family History    Problem Relation Age of Onset  . Diabetes Mother   . Heart disease Mother   . Cancer Mother 28       breast cancer  . Stroke Father   . Diabetes Father   . Heart disease Father        CABG, AVR, A-Fib    Allergies  Allergen Reactions  . Hydrocodone-Acetaminophen     "made her skin crawl"  . Omni-Pac     vomitting  . Voltaren [Diclofenac]     diarrhea    Current Outpatient Prescriptions on File Prior to Visit  Medication Sig Dispense Refill  . cyclobenzaprine (FLEXERIL) 10 MG tablet TAKE ONE TABLET BY MOUTH TWICE A DAY AS NEEDED 30 tablet 1  . ESTRACE VAGINAL 0.1 MG/GM vaginal cream Place 1 application vaginally at bedtime.  1  . estradiol (ESTRACE) 0.5 MG tablet Take 0.5 mg by mouth daily.      Marland Kitchen FLUoxetine (PROZAC) 20 MG capsule TAKE ONE CAPSULE BY MOUTH DAILY 90 capsule 1  . lidocaine (LIDODERM) 5 % Place 1 patch onto the skin daily. Remove & Discard patch within 12 hours or as directed by MD 30  patch 0  . medroxyPROGESTERone (PROVERA) 2.5 MG tablet Take 1 tablet by mouth daily.    . Multiple Vitamins-Minerals (MULTIVITAMIN WITH MINERALS) tablet 1 tablet.      Marland Kitchen zolpidem (AMBIEN) 10 MG tablet TAKE ONE-HALF TO ONE TABLET BY MOUTH AT BEDTIME AS NEEDED FOR SLEEP 15 tablet 0   No current facility-administered medications on file prior to visit.     BP 115/78 (Cuff Size: Normal)   Pulse 60   Temp 98.1 F (36.7 C) (Oral)   Resp 16   Ht 5\' 4"  (1.626 m)   Wt 158 lb 6.4 oz (71.8 kg)   SpO2 99%   BMI 27.19 kg/m       Objective:   Physical Exam  Constitutional: She appears well-developed and well-nourished.  Cardiovascular: Normal rate, regular rhythm and normal heart sounds.   No murmur heard. Pulmonary/Chest: Effort normal and breath sounds normal. No respiratory distress. She has no wheezes.  Psychiatric: She has a normal mood and affect. Her behavior is normal. Judgment and thought content normal.          Assessment & Plan:  Depression- uncontrolled. PHQ-9  up to 12 from 10 last visit. Will increase prozac from 20mg  to 40mg .    Hyperlipidemia- non-fasting today. Will check next visit with her cpx.    Insomnia- continue prn ambien. Controlled substance contract signed today and UDS will be sent.

## 2017-03-26 LAB — PAIN MGMT, PROFILE 8 W/CONF, U
6 Acetylmorphine: NEGATIVE ng/mL (ref <10)
ALCOHOL METABOLITES: POSITIVE ng/mL — AB (ref <500)
AMPHETAMINES: NEGATIVE ng/mL (ref <500)
Benzodiazepines: NEGATIVE ng/mL (ref <100)
Buprenorphine, Urine: NEGATIVE ng/mL (ref <5)
COCAINE METABOLITE: NEGATIVE ng/mL (ref <150)
Creatinine: 51.7 mg/dL
ETHYL GLUCURONIDE (ETG): 2004 ng/mL — AB (ref <500)
Ethyl Sulfate (ETS): 362 ng/mL — ABNORMAL HIGH (ref <100)
MARIJUANA METABOLITE: NEGATIVE ng/mL (ref <20)
MDMA: NEGATIVE ng/mL (ref <500)
Opiates: NEGATIVE ng/mL (ref <100)
Oxidant: NEGATIVE ug/mL (ref <200)
Oxycodone: NEGATIVE ng/mL (ref <100)
pH: 6.6 (ref 4.5–9.0)

## 2017-03-27 ENCOUNTER — Encounter: Payer: Self-pay | Admitting: Family

## 2017-05-04 ENCOUNTER — Encounter: Payer: Self-pay | Admitting: Family

## 2017-05-04 ENCOUNTER — Ambulatory Visit (HOSPITAL_BASED_OUTPATIENT_CLINIC_OR_DEPARTMENT_OTHER)
Admission: RE | Admit: 2017-05-04 | Discharge: 2017-05-04 | Disposition: A | Discharge status: Home / Self Care | Payer: BLUE CROSS/BLUE SHIELD | Source: Ambulatory Visit | Attending: Family | Admitting: Family

## 2017-05-04 ENCOUNTER — Ambulatory Visit (INDEPENDENT_AMBULATORY_CARE_PROVIDER_SITE_OTHER): Payer: BLUE CROSS/BLUE SHIELD | Admitting: Family

## 2017-05-04 VITALS — BP 110/75 | Temp 98.7°F | Ht 64.0 in | Wt 159.2 lb

## 2017-05-04 DIAGNOSIS — Z0000 Encounter for general adult medical examination without abnormal findings: Secondary | ICD-10-CM | POA: Diagnosis not present

## 2017-05-04 DIAGNOSIS — M85851 Other specified disorders of bone density and structure, right thigh: Secondary | ICD-10-CM | POA: Diagnosis not present

## 2017-05-04 DIAGNOSIS — E348 Other specified endocrine disorders: Secondary | ICD-10-CM | POA: Insufficient documentation

## 2017-05-04 DIAGNOSIS — F32A Depression, unspecified: Secondary | ICD-10-CM

## 2017-05-04 DIAGNOSIS — Z780 Asymptomatic menopausal state: Secondary | ICD-10-CM | POA: Diagnosis not present

## 2017-05-04 DIAGNOSIS — F329 Major depressive disorder, single episode, unspecified: Secondary | ICD-10-CM

## 2017-05-04 DIAGNOSIS — M8580 Other specified disorders of bone density and structure, unspecified site: Secondary | ICD-10-CM | POA: Insufficient documentation

## 2017-05-04 DIAGNOSIS — Z8262 Family history of osteoporosis: Secondary | ICD-10-CM | POA: Insufficient documentation

## 2017-05-04 DIAGNOSIS — R9431 Abnormal electrocardiogram [ECG] [EKG]: Secondary | ICD-10-CM | POA: Diagnosis not present

## 2017-05-04 HISTORY — DX: Other specified disorders of bone density and structure, unspecified site: M85.80

## 2017-05-04 LAB — CBC WITH DIFFERENTIAL/PLATELET
BASOS PCT: 0.8 % (ref 0.0–3.0)
Basophils Absolute: 0.1 10*3/uL (ref 0.0–0.1)
EOS ABS: 0.2 10*3/uL (ref 0.0–0.7)
EOS PCT: 2.5 % (ref 0.0–5.0)
HEMATOCRIT: 36.6 % (ref 36.0–46.0)
HEMOGLOBIN: 12.2 g/dL (ref 12.0–15.0)
LYMPHS PCT: 31.8 % (ref 12.0–46.0)
Lymphs Abs: 2.2 10*3/uL (ref 0.7–4.0)
MCHC: 33.5 g/dL (ref 30.0–36.0)
MCV: 95.6 fl (ref 78.0–100.0)
Monocytes Absolute: 0.4 10*3/uL (ref 0.1–1.0)
Monocytes Relative: 6.1 % (ref 3.0–12.0)
Neutro Abs: 4.1 10*3/uL (ref 1.4–7.7)
Neutrophils Relative %: 58.8 % (ref 43.0–77.0)
Platelets: 254 10*3/uL (ref 150.0–400.0)
RBC: 3.83 Mil/uL — AB (ref 3.87–5.11)
RDW: 12.7 % (ref 11.5–15.5)
WBC: 6.9 10*3/uL (ref 4.0–10.5)

## 2017-05-04 LAB — HEPATIC FUNCTION PANEL
ALT: 14 U/L (ref 0–35)
AST: 14 U/L (ref 0–37)
Albumin: 4.2 g/dL (ref 3.5–5.2)
Alkaline Phosphatase: 64 U/L (ref 39–117)
BILIRUBIN DIRECT: 0.1 mg/dL (ref 0.0–0.3)
TOTAL PROTEIN: 7 g/dL (ref 6.0–8.3)
Total Bilirubin: 0.5 mg/dL (ref 0.2–1.2)

## 2017-05-04 LAB — BASIC METABOLIC PANEL
BUN: 21 mg/dL (ref 6–23)
CHLORIDE: 106 meq/L (ref 96–112)
CO2: 29 mEq/L (ref 19–32)
CREATININE: 0.65 mg/dL (ref 0.40–1.20)
Calcium: 9.2 mg/dL (ref 8.4–10.5)
GFR: 100.11 mL/min (ref >60.00)
Glucose, Bld: 93 mg/dL (ref 70–99)
POTASSIUM: 4.4 meq/L (ref 3.5–5.1)
Sodium: 141 mEq/L (ref 135–145)

## 2017-05-04 LAB — URINALYSIS, ROUTINE W REFLEX MICROSCOPIC
BILIRUBIN URINE: NEGATIVE
HGB URINE DIPSTICK: NEGATIVE
KETONES UR: NEGATIVE
LEUKOCYTES UA: NEGATIVE
NITRITE: NEGATIVE
RBC / HPF: NONE SEEN (ref 0–?)
Specific Gravity, Urine: 1.025 (ref 1.000–1.030)
TOTAL PROTEIN, URINE-UPE24: NEGATIVE
UROBILINOGEN UA: 0.2 (ref 0.0–1.0)
Urine Glucose: NEGATIVE
pH: 6 (ref 5.0–8.0)

## 2017-05-04 LAB — LIPID PANEL
CHOL/HDL RATIO: 5
CHOLESTEROL: 283 mg/dL — AB (ref 0–200)
HDL: 56.1 mg/dL (ref >39.00)
LDL Cholesterol: 208 mg/dL — ABNORMAL HIGH (ref 0–99)
NonHDL: 226.46
TRIGLYCERIDES: 94 mg/dL (ref 0.0–149.0)
VLDL: 18.8 mg/dL (ref 0.0–40.0)

## 2017-05-04 LAB — TSH: TSH: 2.59 u[IU]/mL (ref 0.35–4.50)

## 2017-05-04 MED ORDER — FLUOXETINE HCL 20 MG PO CAPS: 20.0000 mg | ORAL_CAPSULE | Freq: Every day | ORAL | 1 refills | Status: DC

## 2017-05-04 NOTE — Progress Notes (Signed)
Subjective:    Patient ID: Haley Fuentes, female    DOB: 03-30-1961, 56 y.o.   MRN: [id number]  HPI  Patient presents today for complete physical.  Immunizations:  Tetanus/flu shot up to date Diet: healthy Exercise: walking almost every day Dexa: due Pap Smear: 5/15-  Mammogram: 03/22/17  Depression- was too tired on prozac 40mg  so dropped back to 20mg .  Mom was ill with PNA, MRSA UTI.  Brother had severe jaundice (ETOH abuse), pancreatitis, PNA.  Feels exhausted.  Feels like her mood issues are situational.    Review of Systems  Constitutional: Negative for unexpected weight change.  HENT:       Mild sore throat which she attributes to canker sores  Respiratory:       Mild dry cough HS  Cardiovascular: Negative for leg swelling.  Gastrointestinal: Negative for diarrhea.       Occasional constipation, resolves with prn miralax  Genitourinary: Negative for dysuria and frequency.  Musculoskeletal:       Chronic joint/muscle pain at baseline  Skin: Negative for rash.  Neurological: Negative for headaches.  Hematological: Negative for adenopathy.  Psychiatric/Behavioral:       See HPI       Past Medical History:  Diagnosis Date  . Anemia    hx of iron deficient anemia  . Depression   . Gastric polyp   . GERD (gastroesophageal reflux disease)   . Hemorrhoids   . Joint pain   . Postmenopausal      Social History   Social History  . Marital status: Married    Spouse name: N/A  . Number of children: 2  . Years of education: N/A   Occupational History  . 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 Smoking status: Former Smoker    Types: Cigarettes    Quit date: 07/04/1985  . Smokeless tobacco: Never Used  . Alcohol use 0.0 oz/week     Comment: weekends  . Drug use: No  . Sexual activity: Not on file   Other Topics Concern  . 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          Past Surgical History:  Procedure Laterality Date  . ENDOMETRIAL ABLATION  2007   Novasure  . TUBAL LIGATION  1983  . TUBAL LIGATION      Family History  Problem Relation Age of Onset  . Diabetes Mother   . Heart disease Mother   . Cancer Mother 3       breast cancer  . Stroke Father   . Diabetes Father   . Heart disease Father        CABG, AVR, A-Fib    Allergies  Allergen Reactions  . Hydrocodone-Acetaminophen     "made her skin crawl"  . Hydrocodone-Acetaminophen Itching  . Omni-Pac     vomitting  . Voltaren [Diclofenac]     diarrhea    Current Outpatient Prescriptions on File Prior to Visit  Medication Sig Dispense Refill  . cyclobenzaprine (FLEXERIL) 10 MG tablet TAKE ONE TABLET BY MOUTH TWICE A DAY AS NEEDED 30 tablet 1  . ESTRACE VAGINAL 0.1 MG/GM vaginal cream Place 1 application vaginally at bedtime.  1  . estradiol (ESTRACE) 0.5 MG tablet Take 0.5 mg by mouth daily.      Marland Kitchen lidocaine (LIDODERM) 5 % Place 1 patch onto the skin daily. Remove & Discard patch within 12 hours or as directed by MD 30 patch 0  . medroxyPROGESTERone (PROVERA) 2.5 MG tablet Take 1 tablet by mouth daily.    . Multiple Vitamins-Minerals (MULTIVITAMIN WITH MINERALS) tablet 1 tablet.      Marland Kitchen zolpidem (AMBIEN) 10 MG tablet TAKE ONE-HALF TO ONE TABLET BY MOUTH AT BEDTIME AS NEEDED FOR SLEEP 15 tablet 0   No current facility-administered medications on file prior to visit.     BP 110/75   Temp 98.7 F (37.1 C)   Ht 5\' 4"  (1.626 m)   Wt 159 lb 3.2 oz (72.2 kg)   SpO2 97%   BMI 27.33 kg/m    Objective:   Physical Exam  Physical Exam  Constitutional: She is oriented to person, place, and time. She appears well-developed and well-nourished. No distress.  HENT:  Head: Normocephalic and atraumatic.  Right  Ear: Tympanic membrane and ear canal normal.  Left Ear: Tympanic membrane and ear canal normal.  Mouth/Throat: Oropharynx is clear and moist.  Eyes: Pupils are equal, round, and reactive to light. No scleral icterus.  Neck: Normal range of motion. No thyromegaly present.  Cardiovascular: Normal rate and regular rhythm.   No murmur heard. Pulmonary/Chest: Effort normal and breath sounds normal. No respiratory distress. He has no wheezes. She has no rales. She exhibits no tenderness.  Abdominal: Soft. Bowel sounds are normal. She exhibits no distension and no mass. There is no tenderness. There is no rebound and no guarding.  Musculoskeletal: She exhibits no edema.  Lymphadenopathy:    She has no cervical adenopathy.  Neurological: She is alert and oriented to person, place, and time. She has normal patellar reflexes. She exhibits normal muscle tone. Coordination normal.  Skin: Skin is warm and dry.  Psychiatric: She has a normal mood and affect. Her behavior is normal. Judgment and thought content normal.  Breasts: Examined lying Right: Without masses, retractions, discharge or axillary adenopathy.  Left: Without masses, retractions, discharge or axillary adenopathy.  Pelvic: deferred       Assessment & Plan:         Assessment & Plan:  Preventative Care- discussed healthy diet, regular exercise and weight loss. EKG notes TWI V1 and V2- not new. Had normal stress test 2016 for same.   Refer for dexa, mammogram.  Obtain routine lab work.   Depression- fair control. She wishes to continue prozac 20mg  for now.  In the future if symptoms worsen could consider increasing to 30mg .

## 2017-05-04 NOTE — Patient Instructions (Signed)
Please complete lab work prior to leaving. Schedule bone density on the first floor in imaging.   Continue to work on [employer], regular exercise and weight loss.  Check with your pharmacy to see if they have the shingles vaccine (shingrix) in stock.

## 2017-05-10 ENCOUNTER — Encounter: Payer: Self-pay | Admitting: Family

## 2017-05-10 DIAGNOSIS — E785 Hyperlipidemia, unspecified: Secondary | ICD-10-CM

## 2017-05-10 NOTE — Telephone Encounter (Signed)
Melissa-- it looks like results have been released but I do not see that we have an interpretation noted in her chart yet. Please advise?

## 2017-05-12 MED ORDER — ATORVASTATIN CALCIUM 10 MG PO TABS: 10.0000 mg | ORAL_TABLET | Freq: Every day | ORAL | 3 refills | Status: DC

## 2017-05-12 NOTE — Addendum Note (Signed)
Addended by: Kelle Darting A on: 05/12/2017 04:18 PM   Modules accepted: Orders

## 2017-05-12 NOTE — Telephone Encounter (Signed)
Cancelled Rx with Pam at [facility] and re-sent it to [facility].

## 2017-05-31 ENCOUNTER — Encounter: Payer: Self-pay | Admitting: Family

## 2017-06-01 NOTE — Telephone Encounter (Signed)
[place], can you help me with this one please?

## 2017-06-23 ENCOUNTER — Encounter: Payer: Self-pay | Admitting: Family

## 2017-06-23 ENCOUNTER — Ambulatory Visit: Payer: BLUE CROSS/BLUE SHIELD | Admitting: Family

## 2017-06-23 VITALS — BP 128/81 | HR 61 | Temp 98.5°F | Resp 16 | Ht 64.0 in | Wt 161.4 lb

## 2017-06-23 DIAGNOSIS — E785 Hyperlipidemia, unspecified: Secondary | ICD-10-CM | POA: Diagnosis not present

## 2017-06-23 DIAGNOSIS — F329 Major depressive disorder, single episode, unspecified: Secondary | ICD-10-CM

## 2017-06-23 DIAGNOSIS — F32A Depression, unspecified: Secondary | ICD-10-CM

## 2017-06-23 LAB — LIPID PANEL
CHOL/HDL RATIO: 4
Cholesterol: 211 mg/dL — ABNORMAL HIGH (ref 0–200)
HDL: 54.4 mg/dL (ref >39.00)
LDL CALC: 123 mg/dL — AB (ref 0–99)
NonHDL: 156.94
TRIGLYCERIDES: 172 mg/dL — AB (ref 0.0–149.0)
VLDL: 34.4 mg/dL (ref 0.0–40.0)

## 2017-06-23 NOTE — Progress Notes (Signed)
Subjective:    Patient ID: Haley Fuentes, female    DOB: 04-24-1961, 56 y.o.   MRN: [id number]  HPI  Haley Fuentes is a 56 yr old female who presents today for follow up.  1) Depression- Mom is now on hospice/palliative care.  Feels like depression is "probably worse" than last time. Reports that she took melatonin  Which helpedher to sleep  2) Hyperlipidemia- reports that she feels "so weak", having bilateral knee pain.   "everything hurts."  Lab Results  Component Value Date   CHOL 283 (H) 05/04/2017   HDL 56.10 05/04/2017   LDLCALC 208 (H) 05/04/2017   LDLDIRECT 143 (H) 10/21/2009   TRIG 94.0 05/04/2017   CHOLHDL 5 05/04/2017     Review of Systems See HPI  Past Medical History:  Diagnosis Date  . Anemia    hx of iron deficient anemia  . Depression   . Gastric polyp   . GERD (gastroesophageal reflux disease)   . Hemorrhoids   . Joint pain   . Osteopenia 05/04/2017  . Postmenopausal      Social History   Socioeconomic History  . Marital status: Married    Spouse name: Not on file  . Number of children: 2  . Years of education: Not on file  . Highest education level: Not on file  Social Needs  . Financial resource strain: Not on file  . Food insecurity - worry: Not on file  . Food insecurity - inability: Not on file  . Transportation needs - medical: Not on file  . Transportation needs - non-medical: Not on file  Occupational History  . 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 Smoking status: Former Smoker    Types: Cigarettes    Last attempt to quit: 07/04/1985    Years since quitting: 31.9  . Smokeless tobacco: Never Used  Substance and Sexual Activity  . Alcohol use: Yes    Alcohol/week: 0.0 oz    Comment: weekends  . Drug use: No  . Sexual activity: Not on file  Other Topics Concern  . 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       Past Surgical History:  Procedure Laterality Date  . ENDOMETRIAL ABLATION  2007   Novasure  . TUBAL LIGATION  1983  . TUBAL LIGATION      Family History  Problem Relation Age of Onset  . Diabetes Mother   . Heart disease Mother   . Cancer Mother 42       breast cancer  . Stroke Father   . Diabetes Father   . Heart disease Father        CABG, AVR, A-Fib    Allergies  Allergen Reactions  . Hydrocodone-Acetaminophen     "made Haley Fuentes skin crawl"  . Hydrocodone-Acetaminophen Itching  . Omni-Pac     vomitting  . Voltaren [Diclofenac]     diarrhea    Current Outpatient Medications on File Prior to Visit  Medication Sig Dispense Refill  . cyclobenzaprine (FLEXERIL) 10 MG tablet TAKE ONE TABLET BY MOUTH TWICE A DAY AS NEEDED 30 tablet 1  . ESTRACE VAGINAL 0.1 MG/GM vaginal cream Place 1 application vaginally at bedtime.  1  . estradiol (ESTRACE) 0.5 MG tablet Take 0.5 mg by mouth daily.      Marland Kitchen FLUoxetine (PROZAC) 20 MG capsule Take 1 capsule (20 mg total) by mouth daily. 90 capsule 1  . lidocaine (LIDODERM) 5 % Place 1 patch onto the skin daily. Remove & Discard patch within 12 hours or as directed by MD 30 patch 0  . medroxyPROGESTERone (PROVERA) 2.5 MG tablet Take 1 tablet by mouth daily.    . Multiple Vitamins-Minerals (MULTIVITAMIN WITH MINERALS) tablet 1 tablet.      Marland Kitchen zolpidem (AMBIEN) 10 MG tablet TAKE ONE-HALF TO ONE TABLET BY MOUTH AT BEDTIME AS NEEDED FOR SLEEP 15 tablet 0   No current facility-administered medications on file prior to visit.     BP 128/81 (BP Location: Right Arm, Cuff Size: Normal)   Pulse 61   Temp 98.5 F (36.9 C) (Oral)   Resp 16   Ht 5\' 4"  (1.626 m)   Wt 161 lb 6.4 oz (73.2 kg)   SpO2 100%   BMI 27.70 kg/m       Objective:   Physical Exam  Constitutional: She is oriented to person,  place, and time. She appears well-developed and well-nourished.  HENT:  Head: Normocephalic and atraumatic.  Cardiovascular: Normal rate, regular rhythm and normal heart sounds.  No murmur heard. Pulmonary/Chest: Effort normal and breath sounds normal. No respiratory distress. She has no wheezes.  Musculoskeletal: She exhibits no edema.  Neurological: She is alert and oriented to person, place, and time.  Psychiatric: She has a normal mood and affect. Haley Fuentes behavior is normal. Judgment and thought content normal.          Assessment & Plan:  Depression- having a lot of stress with mother's health and brother's health (ETOH abuse).  We discussed continuing current dose of prozac, and following up in 3 months. If symptoms fail to improve we could consider adjusting med at that time, but right now symptoms are very situational I believe.   Hyperlipidemia- she is having some muscle/joint pain. Advised pt as follows:  Hold  lipitor x 2 weeks and let me know how she is feeling. If not improved may need to do some additional work up. If improved, could try pravastatin instead of lipitor.

## 2017-06-23 NOTE — Patient Instructions (Signed)
Please complete lab work prior to leaving. Stop lipitor- let me know via mychart in 2 weeks how you are feeling in terms of muscle joint pain. Continue current dose of prozac.

## 2017-06-28 ENCOUNTER — Other Ambulatory Visit: Payer: Self-pay | Admitting: Family

## 2017-08-07 ENCOUNTER — Other Ambulatory Visit: Payer: Self-pay | Admitting: Family

## 2017-09-22 ENCOUNTER — Ambulatory Visit: Payer: BLUE CROSS/BLUE SHIELD | Admitting: Family

## 2017-09-22 ENCOUNTER — Encounter: Payer: Self-pay | Admitting: Family

## 2017-09-22 VITALS — BP 123/73 | HR 68 | Temp 97.8°F | Resp 20 | Ht 64.0 in | Wt 167.2 lb

## 2017-09-22 DIAGNOSIS — Z79899 Other long term (current) drug therapy: Secondary | ICD-10-CM | POA: Diagnosis not present

## 2017-09-22 DIAGNOSIS — F32A Depression, unspecified: Secondary | ICD-10-CM

## 2017-09-22 DIAGNOSIS — M2550 Pain in unspecified joint: Secondary | ICD-10-CM | POA: Diagnosis not present

## 2017-09-22 DIAGNOSIS — R0789 Other chest pain: Secondary | ICD-10-CM | POA: Diagnosis not present

## 2017-09-22 DIAGNOSIS — F329 Major depressive disorder, single episode, unspecified: Secondary | ICD-10-CM

## 2017-09-22 LAB — SEDIMENTATION RATE: SED RATE: 9 mm/h (ref 0–30)

## 2017-09-22 MED ORDER — OMEPRAZOLE 40 MG PO CPDR: 40.0000 mg | DELAYED_RELEASE_CAPSULE | Freq: Every day | ORAL | 3 refills | Status: DC

## 2017-09-22 MED ORDER — ZOLPIDEM TARTRATE 10 MG PO TABS: ORAL_TABLET | ORAL | 0 refills | Status: DC

## 2017-09-22 NOTE — Patient Instructions (Addendum)
Please complete lab work prior to leaving. Consider establishing with a counselor for grief counseling- [phone] Increase omeprazole to 40mg  once daily.

## 2017-09-22 NOTE — Progress Notes (Signed)
Subjective:    Patient ID: Haley Fuentes, female    DOB: [birth year], 57 y.o.   MRN: [id number]  HPI  Ms. Haley Fuentes is a 57 yr old female who presents today for follow up.  Depression- Mother and brother recently passed away. Mother passed about 3 months ago and her brother passed away last week. Feels like she was handling her mother's death well but losing her brother really hit her hard. Continue prozac.    Joint pain-  Reports that "everything hurts." stopped statin.   GERD- reports a pressure in the upper esophageal area.  Wonders if it is reflux related (she is taking omeprazole 15m otc).  Also, wonders if it is related to emotions.    Review of Systems See HPI  Past Medical History:  Diagnosis Date  . Anemia    hx of iron deficient anemia  . Depression   . Gastric polyp   . GERD (gastroesophageal reflux disease)   . Hemorrhoids   . Joint pain   . Osteopenia 05/04/2017  . Postmenopausal      Social History   Socioeconomic History  . Marital status: Married    Spouse name: Not on file  . Number of children: 2  . Years of education: Not on file  . Highest education level: Not on file  Occupational History  . 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 Financial resource strain: Not on file  . Food insecurity:    Worry: Not on file    Inability: Not on file  . Transportation needs:    Medical: Not on file    Non-medical: Not on file  Tobacco Use  . Smoking status: Former Smoker    Types: Cigarettes    Last attempt to quit: 07/04/1985    Years since quitting: 32.2  . Smokeless tobacco: Never Used  Substance and Sexual Activity  . Alcohol use: Yes    Alcohol/week: 0.0 oz    Comment: weekends  . Drug use: No  . Sexual activity: Not on file  Lifestyle  . Physical activity:    Days per week: Not on file    Minutes per session: Not on file  . Stress: Not on file  Relationships  . Social connections:    Talks on phone: Not on  file    Gets together: Not on file    Attends religious service: Not on file    Active member of club or organization: Not on file    Attends meetings of clubs or organizations: Not on file    Relationship status: Not on file  . Intimate partner violence:    Fear of current or ex partner: Not on file    Emotionally abused: Not on file    Physically abused: Not on file    Forced sexual activity: Not on file  Other Topics Concern  . 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       Past Surgical History:  Procedure Laterality Date  . ENDOMETRIAL ABLATION  2007   Novasure  . TUBAL LIGATION  1983  . TUBAL LIGATION      Family History  Problem Relation Age of Onset  . Diabetes Mother   .  Heart disease Mother   . Cancer Mother 61       breast cancer  . Stroke Father   . Diabetes Father   . Heart disease Father        CABG, AVR, A-Fib  . Hepatitis Brother   . Alcohol abuse Brother     Allergies  Allergen Reactions  . Hydrocodone-Acetaminophen     "made her skin crawl"  . Hydrocodone-Acetaminophen Itching  . Omni-Pac     vomitting  . Voltaren [Diclofenac]     diarrhea    Current Outpatient Medications on File Prior to Visit  Medication Sig Dispense Refill  . cyclobenzaprine (FLEXERIL) 10 MG tablet TAKE 1 TABLET BY MOUTH TWO TIMES A DAY AS NEEDED 30 tablet 1  . ESTRACE VAGINAL 0.1 MG/GM vaginal cream Place 1 application vaginally at bedtime.  1  . estradiol (ESTRACE) 0.5 MG tablet Take 0.5 mg by mouth daily.      Marland Kitchen FLUoxetine (PROZAC) 20 MG capsule Take 1 capsule (20 mg total) by mouth daily. 90 capsule 1  . lidocaine (LIDODERM) 5 % Place 1 patch onto the skin daily. Remove & Discard patch within 12 hours or as directed by MD 30 patch 0  .  medroxyPROGESTERone (PROVERA) 2.5 MG tablet Take 1 tablet by mouth daily.    . Multiple Vitamins-Minerals (MULTIVITAMIN WITH MINERALS) tablet 1 tablet.      Marland Kitchen zolpidem (AMBIEN) 10 MG tablet TAKE ONE-HALF TO ONE TABLET BY MOUTH AT BEDTIME AS NEEDED FOR SLEEP 15 tablet 0   No current facility-administered medications on file prior to visit.     BP 123/73   Pulse 68   Temp 97.8 F (36.6 C) (Oral)   Resp 20   Ht '5\' 4"'  (1.626 m)   Wt 167 lb 3.2 oz (75.8 kg)   SpO2 98%   BMI 28.70 kg/m       Objective:   Physical Exam  Constitutional: She is oriented to person, place, and time. She appears well-developed and well-nourished.  HENT:  Head: Normocephalic and atraumatic.  Cardiovascular: Normal rate, regular rhythm and normal heart sounds.  No murmur heard. Pulmonary/Chest: Effort normal and breath sounds normal. No respiratory distress. She has no wheezes.  Musculoskeletal: She exhibits no edema.  Neurological: She is alert and oriented to person, place, and time.  Psychiatric: Her behavior is normal. Judgment and thought content normal.  Pleasant but flat affect          Assessment & Plan:  Joint pain- could be stress related.  She is off of statin. Will check ANA, ESR, RA.  Depression- scored 13 on PHQ-9. deteriorated due to situational stressors.  Continue current dose of prozac, discussed counseling.  Atypical chest pain- suspect that this is due to gerd as well as stress.  EKG is personally reviewed and notes NSR. TWI noted in V2. This finding was also present on 04/10/15 EKG.   She had a negative ETT at that time. Will increase omeprazole from 67m to 437m She understands that she is to go to the ER if she develops severe/worsening chest pain.

## 2017-09-24 LAB — PAIN MGMT, PROFILE 8 W/CONF, U
6 ACETYLMORPHINE: NEGATIVE ng/mL (ref <10)
Alcohol Metabolites: POSITIVE ng/mL — AB (ref <500)
Amphetamines: NEGATIVE ng/mL (ref <500)
Benzodiazepines: NEGATIVE ng/mL (ref <100)
Buprenorphine, Urine: NEGATIVE ng/mL (ref <5)
Cocaine Metabolite: NEGATIVE ng/mL (ref <150)
Creatinine: 161.9 mg/dL
ETHYL GLUCURONIDE (ETG): 65611 ng/mL — AB (ref <500)
Ethyl Sulfate (ETS): 12185 ng/mL — ABNORMAL HIGH (ref <100)
MARIJUANA METABOLITE: NEGATIVE ng/mL (ref <20)
MDMA: NEGATIVE ng/mL (ref <500)
OPIATES: NEGATIVE ng/mL (ref <100)
OXYCODONE: NEGATIVE ng/mL (ref <100)
Oxidant: NEGATIVE ug/mL (ref <200)
pH: 6.05 (ref 4.5–9.0)

## 2017-09-26 ENCOUNTER — Encounter: Payer: Self-pay | Admitting: Family

## 2017-09-26 DIAGNOSIS — R768 Other specified abnormal immunological findings in serum: Secondary | ICD-10-CM

## 2017-09-26 LAB — ANTI-NUCLEAR AB-TITER (ANA TITER): ANA Titer 1: 1:320 {titer} — ABNORMAL HIGH

## 2017-09-26 LAB — ANA: Anti Nuclear Antibody(ANA): POSITIVE — AB

## 2017-09-26 LAB — RHEUMATOID FACTOR

## 2017-10-02 ENCOUNTER — Encounter: Payer: Self-pay | Admitting: Family

## 2017-10-08 ENCOUNTER — Other Ambulatory Visit: Payer: Self-pay | Admitting: Family

## 2017-10-24 ENCOUNTER — Encounter: Payer: Self-pay | Admitting: Family

## 2017-10-24 NOTE — Telephone Encounter (Signed)
Please cancel her appointment on May 3 with me?

## 2017-10-25 NOTE — Telephone Encounter (Signed)
It appears appointment was already cancelled by patient via mychart.

## 2017-11-03 ENCOUNTER — Ambulatory Visit: Payer: BLUE CROSS/BLUE SHIELD | Admitting: Family

## 2017-11-09 ENCOUNTER — Other Ambulatory Visit: Payer: Self-pay | Admitting: Family

## 2017-11-23 NOTE — Progress Notes (Signed)
Office Visit Note  Patient: Haley Fuentes             Date of Birth: 1960-08-28           MRN: [id number]             PCP: [name], NP Referring: [name], NP Visit Date: 12/05/2017 Occupation: Engineer, maintenance of human resources    Subjective:  Pain in multiple joints  History of Present Illness: Haley Fuentes is a 57 y.o. female seen in consultation per request of her PCP.  According to patient she has had generalized body pain and hip pain for last 15 years.  She states she has had cortisone injection to her hip about 15 years ago.  In the last 1 month she has been experiencing increased lower back pain especially with certain movements the pain gets worse.  She denies any history of radiculopathy.  In September 2018 she was started experiencing some neck pain which is been almost constant and has been moving across her shoulders.  She has been experiencing some tingling sensation in her right arm.  She has intermittent paresthesias only in her right arm.  She is been also experiencing some pain in her bilateral elbows.  She states last week she went for a trip where she had to walk a lot.  When she came back she started experiencing pain in her bilateral calves and also in bilateral heels.  Which is gradually getting better.  She recalls 3 years ago she had right Achilles tendinitis which got better but she does have mild flares off and on.  There is no family history of personal history of psoriasis.  None of the other joints are painful.  There is no history of joint swelling.  Is been under a lot of stress that she lost her mother in December 2018 and her brother in March 2019.   Activities of Daily Living:  Patient reports morning stiffness for 30-60  minutes.   Patient Reports nocturnal pain.  Difficulty dressing/grooming: Denies Difficulty climbing stairs: Reports Difficulty getting out of chair: Denies Difficulty using hands for taps, buttons, cutlery, and/or  writing: Denies   Review of Systems  Constitutional: Positive for fatigue. Negative for night sweats, weight gain and weight loss.  HENT: Positive for mouth dryness. Negative for mouth sores, trouble swallowing, trouble swallowing and nose dryness.   Eyes: Negative for pain, redness, visual disturbance and dryness.  Respiratory: Negative for cough, hemoptysis, shortness of breath and difficulty breathing.   Cardiovascular: Negative for chest pain, palpitations, hypertension, irregular heartbeat and swelling in legs/feet.  Gastrointestinal: Negative for blood in stool, constipation and diarrhea.  Endocrine: Negative for increased urination.  Genitourinary: Negative for painful urination and vaginal dryness.  Musculoskeletal: Positive for arthralgias, joint pain, myalgias, morning stiffness, muscle tenderness and myalgias. Negative for joint swelling and muscle weakness.  Skin: Negative for color change, pallor, rash, hair loss, nodules/bumps, skin tightness, ulcers and sensitivity to sunlight.  Allergic/Immunologic: Negative for susceptible to infections.  Neurological: Negative for dizziness, numbness, headaches, memory loss, night sweats and weakness.  Hematological: Negative for swollen glands.  Psychiatric/Behavioral: Positive for depressed mood and sleep disturbance. The patient is nervous/anxious.     PMFS History:  Patient Active Problem List   Diagnosis Date Noted  . Osteopenia 05/04/2017  . Nonspecific abnormal electrocardiogram (ECG) (EKG) 04/14/2015  . TMJ (temporomandibular joint syndrome) 04/11/2015  . Preventative health care 01/30/2015  . Arthralgia 01/30/2015  . Tendonitis, Achilles, right 09/04/2014  .  Carpal tunnel syndrome 10/08/2013  . GERD (gastroesophageal reflux disease) 01/14/2013  . Allergic rhinitis 02/17/2012  . Low back pain 09/23/2011  . Anemia 04/29/2011  . INSOMNIA, CHRONIC 09/17/2010  . Hyperlipidemia 12/02/2008  . Depression 11/10/2007  .  HEMORRHOIDS 11/10/2007    Past Medical History:  Diagnosis Date  . Anemia    hx of iron deficient anemia  . Depression   . Gastric polyp   . GERD (gastroesophageal reflux disease)   . Hemorrhoids   . Joint pain   . Osteopenia 05/04/2017  . Postmenopausal     Family History  Problem Relation Age of Onset  . Diabetes Mother   . Heart disease Mother   . Cancer Mother 56       breast cancer  . Stroke Father   . Diabetes Father   . Heart disease Father        CABG, AVR, A-Fib  . Hepatitis Brother   . Alcohol abuse Brother   . Raynaud syndrome Sister   . Healthy Daughter   . Depression Son   . Anxiety disorder Son   . Healthy Son    Past Surgical History:  Procedure Laterality Date  . ENDOMETRIAL ABLATION  2007   [facility]  . TUBAL LIGATION  1983  . 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        Objective: Vital Signs: BP 113/76 (BP Location: Left Arm, Patient Position: Sitting, Cuff Size: Normal)   Pulse 63   Resp 17   Ht 5' 3.5" (1.613 m)   Wt 167 lb (75.8 kg)   BMI 29.12 kg/m    Physical Exam  Constitutional: She is oriented to person, place, and time. She appears well-developed and well-nourished.  HENT:  Head: Normocephalic and atraumatic.  Eyes: Conjunctivae and EOM are normal.  Neck: Normal range of motion.  Cardiovascular: Normal rate, regular rhythm, normal heart sounds and intact distal pulses.  Pulmonary/Chest: Effort normal and breath sounds normal.  Abdominal: Soft. Bowel sounds are normal.  Lymphadenopathy:    She has no cervical adenopathy.  Neurological: She is alert and oriented to person, place, and time.  Skin: Skin is warm and dry. Capillary refill takes less than 2 seconds.    Psychiatric: She has a normal mood and affect. Her behavior is normal.  Nursing note and vitals reviewed.    Musculoskeletal Exam: C-spine good range of motion.  She has some trapezius spasm on palpation.  Thoracic and lumbar spine with good range of motion.  She had tenderness on palpation of her right SI joint.  Shoulder joints elbow joints wrist joints were in good range of motion.  She has mild DIP thickening in her bilateral hands consistent with osteoarthritis.  Hip joints knee joints ankles MTPs PIPs were in good range of motion.  She has some tenderness on palpation of Achilles tendon but no swelling was noted.  CDAI Exam: No CDAI exam completed.    Investigation: Findings:  09/22/17: Sed rate 9, ANA 1:320 Homogeneous, RF <14 05/04/2017: TSH 2.59, Hepatic function panel WNL   Component     Latest Ref Rng & Units 09/22/2017  ANA Pattern 1      HOMOGENEOUS (A)  ANA Titer 1     titer 1:320 (H)  Anit Nuclear Antibody(ANA)     NEGATIVE POSITIVE (A)  RA Latex Turbid.     <14 IU/mL <14  Sed Rate     0 - 30 mm/hr 9   CBC Latest Ref Rng & Units 05/04/2017 03/21/2016 04/10/2015  WBC 4.0 - 10.5 K/uL 6.9 7.2 6.8  Hemoglobin 12.0 - 15.0 g/dL 12.2 12.1 11.4(L)  Hematocrit 36.0 - 46.0 % 36.6 35.5(L) 33.2(L)  Platelets 150.0 - 400.0 K/uL 254.0 246.0 236.0   CMP Latest Ref Rng & Units 05/04/2017 03/21/2016 04/10/2015  Glucose 70 - 99 mg/dL 93 90 96  BUN 6 - 23 mg/dL 21 23 16   Creatinine 0.40 - 1.20 mg/dL 0.65 0.58 0.71  Sodium 135 - 145 mEq/L 141 140 142  Potassium 3.5 - 5.1 mEq/L 4.4 3.8 4.0  Chloride 96 - 112 mEq/L 106 104 108  CO2 19 - 32 mEq/L 29 29 27   Calcium 8.4 - 10.5 mg/dL 9.2 9.4 9.1  Total Protein 6.0 - 8.3 g/dL 7.0 6.9 -  Total Bilirubin 0.2 - 1.2 mg/dL 0.5 0.6 -  Alkaline Phos 39 - 117 U/L 64 63 -  AST 0 - 37 U/L 14 17 -  ALT 0 - 35 U/L 14 23 -     Imaging: Xr Cervical Spine 2 Or 3 Views  Result Date: 12/05/2017 C5-6 narrowing was noted.  No significant facet joint  arthropathy was noted. Impression: Mild spondylosis of the cervical spine with C5-6 narrowing.  Xr Knee 3 View Left  Result Date: 12/05/2017 Moderate medial compartment narrowing was noted.  No chondrocalcinosis was noted.  Moderate patellofemoral narrowing was noted. Impression: Moderate osteoarthritis and moderate chondromalacia patella of the knee joint.  Xr Knee 3 View Right  Result Date: 12/05/2017 Moderate medial compartment narrowing was noted.  No chondrocalcinosis was noted.  Moderate patellofemoral narrowing was noted. Impression: Moderate osteoarthritis and moderate chondromalacia patella of the knee joint.  Xr Lumbar Spine 2-3 Views  Result Date: 12/05/2017 Mild L4-5 L5-S1 narrowing was noted.  Mild facet joint arthropathy was noted.  No syndesmophytes were noted.  Xr Pelvis 1-2 Views  Result Date: 12/05/2017 No SI joint sclerosis or narrowing was noted. Impression: Unremarkable x-ray of the SI joints.   Speciality Comments: No specialty comments available.    Procedures:  No procedures performed Allergies: Hydrocodone-acetaminophen; Hydrocodone-acetaminophen; Omni-pac; and Voltaren [diclofenac]   Assessment / Plan:     Visit Diagnoses: Polyarthralgia - 09/22/17: RF <14, ANA 1:320 Homogeneous, Sed rate 9.  Patient does not have any clinical features of autoimmune disease on examination.  I will obtain AVISE labs.  There is no family history of autoimmune disease.  She states her sister has Raynauds phenomenon phenomenon.  ANA positive - 1:320 Homogeneous   Chronic pain of both knees -has difficulty getting up from a squatting position.  No warmth swelling or effusion was noted.  Plan: XR KNEE 3 VIEW RIGHT, XR KNEE 3 VIEW LEFT.  The x-rays obtained today showed moderate osteoarthritis and chondromalacia patella.  Muscle strengthening exercises were discussed.  Chronic SI joint pain -as of chronic right SI joint pain.  She has had cortisone injection in the past.  Plan: XR  Pelvis 1-2 Views.  The x-rays today were unremarkable.  Neck pain -she has been having C-spine discomfort.  She also had bilateral trapezius spasm.  Plan: XR Cervical Spine 2 or 3 views showed C5-6 narrowing.  She complains of paresthesias in her right arm.  I will schedule nerve conduction velocity of her right upper extremity to evaluate this further.  I have given her a handout on neck exercises.  Chronic midline low back pain without sciatica -she has chronic lower back pain for multiple years.  Plan: XR Lumbar Spine 2-3 Views.  The x-rays today showed mild spondylosis.  No syndesmophytes were noted.  A handout on lower back exercises was given.  Tendonitis, Achilles, right-patient reports having acute Achilles tendinitis 3 years ago. she had mild tenderness on palpation of bilateral Achilles tendon but no tenosynovitis was noted.  TMJ (temporomandibular joint syndrome)  Osteopenia of multiple sites  History of gastroesophageal reflux (GERD)  History of hyperlipidemia  History of depression  History of gastric polyp    Orders: Orders Placed This Encounter  Procedures  . XR KNEE 3 VIEW RIGHT  . XR KNEE 3 VIEW LEFT  . XR Pelvis 1-2 Views  . XR Cervical Spine 2 or 3 views  . XR Lumbar Spine 2-3 Views   No orders of the defined types were placed in this encounter.   Face-to-face time spent with patient was 50 minutes. >50% of time was spent in counseling and coordination of care.  Follow-Up Instructions: Return for Polyarthralgia, positive ANA.   Bo Merino, MD  Note - This record has been created using Editor, commissioning.  Chart creation errors have been sought, but may not always  have been located. Such creation errors do not reflect on  the standard of medical care.

## 2017-12-05 ENCOUNTER — Ambulatory Visit (INDEPENDENT_AMBULATORY_CARE_PROVIDER_SITE_OTHER): Payer: Self-pay

## 2017-12-05 ENCOUNTER — Encounter: Payer: Self-pay | Admitting: Rheumatology

## 2017-12-05 ENCOUNTER — Ambulatory Visit: Payer: BLUE CROSS/BLUE SHIELD | Admitting: Rheumatology

## 2017-12-05 VITALS — BP 113/76 | HR 63 | Resp 17 | Ht 63.5 in | Wt 167.0 lb

## 2017-12-05 DIAGNOSIS — M26609 Unspecified temporomandibular joint disorder, unspecified side: Secondary | ICD-10-CM | POA: Diagnosis not present

## 2017-12-05 DIAGNOSIS — Z8719 Personal history of other diseases of the digestive system: Secondary | ICD-10-CM

## 2017-12-05 DIAGNOSIS — Z8659 Personal history of other mental and behavioral disorders: Secondary | ICD-10-CM

## 2017-12-05 DIAGNOSIS — M545 Low back pain: Secondary | ICD-10-CM

## 2017-12-05 DIAGNOSIS — Z8639 Personal history of other endocrine, nutritional and metabolic disease: Secondary | ICD-10-CM

## 2017-12-05 DIAGNOSIS — M542 Cervicalgia: Secondary | ICD-10-CM | POA: Diagnosis not present

## 2017-12-05 DIAGNOSIS — R768 Other specified abnormal immunological findings in serum: Secondary | ICD-10-CM

## 2017-12-05 DIAGNOSIS — M7661 Achilles tendinitis, right leg: Secondary | ICD-10-CM | POA: Diagnosis not present

## 2017-12-05 DIAGNOSIS — M2550 Pain in unspecified joint: Secondary | ICD-10-CM | POA: Diagnosis not present

## 2017-12-05 DIAGNOSIS — M533 Sacrococcygeal disorders, not elsewhere classified: Secondary | ICD-10-CM | POA: Diagnosis not present

## 2017-12-05 DIAGNOSIS — R202 Paresthesia of skin: Secondary | ICD-10-CM

## 2017-12-05 DIAGNOSIS — M8589 Other specified disorders of bone density and structure, multiple sites: Secondary | ICD-10-CM

## 2017-12-05 DIAGNOSIS — M25562 Pain in left knee: Secondary | ICD-10-CM

## 2017-12-05 DIAGNOSIS — G8929 Other chronic pain: Secondary | ICD-10-CM

## 2017-12-05 DIAGNOSIS — M25561 Pain in right knee: Secondary | ICD-10-CM

## 2017-12-05 NOTE — Patient Instructions (Signed)
Back Exercises The following exercises strengthen the muscles that help to support the back. They also help to keep the lower back flexible. Doing these exercises can help to prevent back pain or lessen existing pain. If you have back pain or discomfort, try doing these exercises 2-3 times each day or as told by your health care provider. When the pain goes away, do them once each day, but increase the number of times that you repeat the steps for each exercise (do more repetitions). If you do not have back pain or discomfort, do these exercises once each day or as told by your health care provider. Exercises Single Knee to Chest  Repeat these steps 3-5 times for each leg: 1. Lie on your back on a firm bed or the floor with your legs extended. 2. Bring one knee to your chest. Your other leg should stay extended and in contact with the floor. 3. Hold your knee in place by grabbing your knee or thigh. 4. Pull on your knee until you feel a gentle stretch in your lower back. 5. Hold the stretch for 10-30 seconds. 6. Slowly release and straighten your leg.  Pelvic Tilt  Repeat these steps 5-10 times: 1. Lie on your back on a firm bed or the floor with your legs extended. 2. Bend your knees so they are pointing toward the ceiling and your feet are flat on the floor. 3. Tighten your lower abdominal muscles to press your lower back against the floor. This motion will tilt your pelvis so your tailbone points up toward the ceiling instead of pointing to your feet or the floor. 4. With gentle tension and even breathing, hold this position for 5-10 seconds.  Cat-Cow  Repeat these steps until your lower back becomes more flexible: 1. Get into a hands-and-knees position on a firm surface. Keep your hands under your shoulders, and keep your knees under your hips. You may place padding under your knees for comfort. 2. Let your head hang down, and point your tailbone toward the floor so your lower back  becomes rounded like the back of a cat. 3. Hold this position for 5 seconds. 4. Slowly lift your head and point your tailbone up toward the ceiling so your back forms a sagging arch like the back of a cow. 5. Hold this position for 5 seconds.  Press-Ups  Repeat these steps 5-10 times: 1. Lie on your abdomen (face-down) on the floor. 2. Place your palms near your head, about shoulder-width apart. 3. While you keep your back as relaxed as possible and keep your hips on the floor, slowly straighten your arms to raise the top half of your body and lift your shoulders. Do not use your back muscles to raise your upper torso. You may adjust the placement of your hands to make yourself more comfortable. 4. Hold this position for 5 seconds while you keep your back relaxed. 5. Slowly return to lying flat on the floor.  Bridges  Repeat these steps 10 times: 1. Lie on your back on a firm surface. 2. Bend your knees so they are pointing toward the ceiling and your feet are flat on the floor. 3. Tighten your buttocks muscles and lift your buttocks off of the floor until your waist is at almost the same height as your knees. You should feel the muscles working in your buttocks and the back of your thighs. If you do not feel these muscles, slide your feet 1-2 inches farther away   from your buttocks. 4. Hold this position for 3-5 seconds. 5. Slowly lower your hips to the starting position, and allow your buttocks muscles to relax completely.  If this exercise is too easy, try doing it with your arms crossed over your chest. Abdominal Crunches  Repeat these steps 5-10 times: 1. Lie on your back on a firm bed or the floor with your legs extended. 2. Bend your knees so they are pointing toward the ceiling and your feet are flat on the floor. 3. Cross your arms over your chest. 4. Tip your chin slightly toward your chest without bending your neck. 5. Tighten your abdominal muscles and slowly raise your  trunk (torso) high enough to lift your shoulder blades a tiny bit off of the floor. Avoid raising your torso higher than that, because it can put too much stress on your low back and it does not help to strengthen your abdominal muscles. 6. Slowly return to your starting position.  Back Lifts Repeat these steps 5-10 times: 1. Lie on your abdomen (face-down) with your arms at your sides, and rest your forehead on the floor. 2. Tighten the muscles in your legs and your buttocks. 3. Slowly lift your chest off of the floor while you keep your hips pressed to the floor. Keep the back of your head in line with the curve in your back. Your eyes should be looking at the floor. 4. Hold this position for 3-5 seconds. 5. Slowly return to your starting position.  Contact a health care provider if:  Your back pain or discomfort gets much worse when you do an exercise.  Your back pain or discomfort does not lessen within 2 hours after you exercise. If you have any of these problems, stop doing these exercises right away. Do not do them again unless your health care provider says that you can. Get help right away if:  You develop sudden, severe back pain. If this happens, stop doing the exercises right away. Do not do them again unless your health care provider says that you can. This information is not intended to replace advice given to you by your health care provider. Make sure you discuss any questions you have with your health care provider. Document Released: 07/28/2004 Document Revised: 10/28/2015 Document Reviewed: 08/14/2014 Elsevier Interactive Patient Education  2017 [employer] Cervical Strain and Sprain Rehab Ask your health care provider which exercises are safe for you. Do exercises exactly as told by your health care provider and adjust them as directed. It is normal to feel mild stretching, pulling, tightness, or discomfort as you do these exercises, but you should stop right away if  you feel sudden pain or your pain gets worse.Do not begin these exercises until told by your health care provider. Stretching and range of motion exercises These exercises warm up your muscles and joints and improve the movement and flexibility of your neck. These exercises also help to relieve pain, numbness, and tingling. Exercise A: Cervical side bend  1. Using good posture, sit on a stable chair or stand up. 2. Without moving your shoulders, slowly tilt your left / right ear to your shoulder until you feel a stretch in your neck muscles. You should be looking straight ahead. 3. Hold for __________ seconds. 4. Repeat with the other side of your neck. Repeat __________ times. Complete this exercise __________ times a day. Exercise B: Cervical rotation  1. Using good posture, sit on a stable chair or stand up. 2.  Slowly turn your head to the side as if you are looking over your left / right shoulder. ? Keep your eyes level with the ground. ? Stop when you feel a stretch along the side and the back of your neck. 3. Hold for __________ seconds. 4. Repeat this by turning to your other side. Repeat __________ times. Complete this exercise __________ times a day. Exercise C: Thoracic extension and pectoral stretch 1. Roll a towel or a small blanket so it is about 4 inches (10 cm) in diameter. 2. Lie down on your back on a firm surface. 3. Put the towel lengthwise, under your spine in the middle of your back. It should not be not under your shoulder blades. The towel should line up with your spine from your middle back to your lower back. 4. Put your hands behind your head and let your elbows fall out to your sides. 5. Hold for __________ seconds. Repeat __________ times. Complete this exercise __________ times a day. Strengthening exercises These exercises build strength and endurance in your neck. Endurance is the ability to use your muscles for a long time, even after your muscles get  tired. Exercise D: Upper cervical flexion, isometric 1. Lie on your back with a thin pillow behind your head and a small rolled-up towel under your neck. 2. Gently tuck your chin toward your chest and nod your head down to look toward your feet. Do not lift your head off the pillow. 3. Hold for __________ seconds. 4. Release the tension slowly. Relax your neck muscles completely before you repeat this exercise. Repeat __________ times. Complete this exercise __________ times a day. Exercise E: Cervical extension, isometric  1. Stand about 6 inches (15 cm) away from a wall, with your back facing the wall. 2. Place a soft object, about 6-8 inches (15-20 cm) in diameter, between the back of your head and the wall. A soft object could be a small pillow, a ball, or a folded towel. 3. Gently tilt your head back and press into the soft object. Keep your jaw and forehead relaxed. 4. Hold for __________ seconds. 5. Release the tension slowly. Relax your neck muscles completely before you repeat this exercise. Repeat __________ times. Complete this exercise __________ times a day. Posture and body mechanics  Body mechanics refers to the movements and positions of your body while you do your daily activities. Posture is part of body mechanics. Good posture and healthy body mechanics can help to relieve stress in your body's tissues and joints. Good posture means that your spine is in its natural S-curve position (your spine is neutral), your shoulders are pulled back slightly, and your head is not tipped forward. The following are general guidelines for applying improved posture and body mechanics to your everyday activities. Standing  When standing, keep your spine neutral and keep your feet about hip-width apart. Keep a slight bend in your knees. Your ears, shoulders, and hips should line up.  When you do a task in which you stand in one place for a long time, place one foot up on a stable object that  is 2-4 inches (5-10 cm) high, such as a footstool. This helps keep your spine neutral. Sitting   When sitting, keep your spine neutral and your keep feet flat on the floor. Use a footrest, if necessary, and keep your thighs parallel to the floor. Avoid rounding your shoulders, and avoid tilting your head forward.  When working at a desk or a  everyday activities.  Standing   When standing, keep your spine neutral and keep your feet about hip-width apart. Keep a slight bend in your knees. Your ears, shoulders, and hips should line up.   When you do a task in which you stand in one place for a long time, place one foot up on a stable object that  is 2-4 inches (5-10 cm) high, such as a footstool. This helps keep your spine neutral.  Sitting     When sitting, keep your spine neutral and your keep feet flat on the floor. Use a footrest, if necessary, and keep your thighs parallel to the floor. Avoid rounding your shoulders, and avoid tilting your head forward.   When working at a desk or a computer, keep your desk at a height where your hands are slightly lower than your elbows. Slide your chair under your desk so you are close enough to maintain good posture.   When working at a computer, place your monitor at a height where you are looking straight ahead and you do not have to tilt your head forward or downward to look at the screen.  Resting  When lying down and resting, avoid positions that are most painful for you. Try to support your neck in a neutral position. You can use a contour pillow or a small rolled-up towel. Your pillow should support your neck but not push on it.  This information is not intended to replace advice given to you by your health care provider. Make sure you discuss any questions you have with your health care provider.  Document Released: 06/20/2005 Document Revised: 02/25/2016 Document Reviewed: 05/27/2015  Elsevier Interactive Patient Education  2018 [employer]

## 2017-12-05 NOTE — Addendum Note (Signed)
Addended by: Francis Gaines C on: 12/05/2017 11:42 AM   Modules accepted: Orders

## 2017-12-06 ENCOUNTER — Encounter: Payer: Self-pay | Admitting: Rheumatology

## 2017-12-08 ENCOUNTER — Encounter: Payer: Self-pay | Admitting: Rheumatology

## 2017-12-10 ENCOUNTER — Other Ambulatory Visit: Payer: Self-pay | Admitting: Family

## 2017-12-11 NOTE — Telephone Encounter (Signed)
Requesting: cyclobenzaprine 10mg  bid prn Contract: yes UDS: 09/22/17 Last OV: 09/22/17 Next Ov: N/A Last refill:11/10/17 Database: OD risk 220/999  Please advise.

## 2017-12-18 ENCOUNTER — Encounter: Payer: Self-pay | Admitting: Rheumatology

## 2017-12-27 ENCOUNTER — Ambulatory Visit (INDEPENDENT_AMBULATORY_CARE_PROVIDER_SITE_OTHER): Payer: BLUE CROSS/BLUE SHIELD | Admitting: Physical Medicine and Rehabilitation

## 2017-12-27 ENCOUNTER — Encounter (INDEPENDENT_AMBULATORY_CARE_PROVIDER_SITE_OTHER): Payer: Self-pay | Admitting: Physical Medicine and Rehabilitation

## 2017-12-27 ENCOUNTER — Encounter: Payer: Self-pay | Admitting: Rheumatology

## 2017-12-27 DIAGNOSIS — R202 Paresthesia of skin: Secondary | ICD-10-CM | POA: Diagnosis not present

## 2017-12-27 NOTE — Progress Notes (Signed)
 .  Numeric Pain Rating Scale and Functional Assessment Average Pain 8   In the last MONTH (on 0-10 scale) has pain interfered with the following?  1. General activity like being  able to carry out your everyday physical activities such as walking, climbing stairs, carrying groceries, or moving a chair?  Rating(5)    

## 2017-12-27 NOTE — Telephone Encounter (Signed)
Spoke with patient and advised per Dr. Estanislado Pandy we have received [facility] labs. Those will be discussed in the office at new patient follow up. If patient needs additional labs those will be drawn at that appointment. Patient verbalized understanding.

## 2017-12-27 NOTE — Telephone Encounter (Signed)
I suppose patient is asking forAVISE labs.  If they were not ordered please give her a order for the labs.

## 2017-12-29 NOTE — Progress Notes (Signed)
Haley Fuentes - 57 y.o. female MRN [id number]  Date of birth: 01-10-1961  Office Visit Note: Visit Date: 12/27/2017 PCP: Haley Alar, NP Referred by: Haley Alar, NP  Subjective: Chief Complaint  Patient presents with  . Right Arm - Pain, Numbness   HPI: Haley Fuentes is a 57 year old right-hand-dominant female with chronic worsening right upper arm and shoulder pain which is a dull ache with numbness in the right arm and hand from the elbow to the hand in particular the first and second fingers being the worst.  She seems to get worsening with sitting.  She also has some tingling into the right side of the jaw and ear.  She has not had prior electrodiagnostic studies.  She has no left-sided complaints.  She recently was seen Haley Fuentes who requested electrodiagnostic study.  She has had no prior cervical surgery or trauma.  She does carry a diagnosis of TMJ dysfunction.  She also has a diagnosis of insomnia.  She does not have a diagnosis of fibromyalgia.   ROS Otherwise per HPI.  Assessment & Plan: Visit Diagnoses:  1. Paresthesia of skin     Plan: No additional findings.  Impression: Essentially NORMAL electrodiagnostic study of the right upper limb.  There is no significant electrodiagnostic evidence of nerve entrapment, brachial plexopathy or cervical radiculopathy.    As you know, purely sensory or demyelinating radiculopathies and chemical radiculitis may not be detected with this particular electrodiagnostic study.  This electrodiagnostic study cannot rule out small fiber polyneuropathy and dysesthesias from central pain sensitization syndromes such as fibromyalgia.  **Myotomal referral pain from trigger points is also not excluded.  I do see referral patterns for myofascial pain referred to the ear at times.   Recommendations: 1.  Follow-up with referring physician. 2.  Continue current management of symptoms.   Meds & Orders: No orders of the defined types  were placed in this encounter.   Orders Placed This Encounter  Procedures  . NCV with EMG (electromyography)    Follow-up: Return for Haley Fuentes.   Procedures: No procedures performed  EMG & NCV Findings: All nerve conduction studies (as indicated in the following tables) were within normal limits.    All examined muscles (as indicated in the following table) showed no evidence of electrical instability.    Impression: Essentially NORMAL electrodiagnostic study of the right upper limb.  There is no significant electrodiagnostic evidence of nerve entrapment, brachial plexopathy or cervical radiculopathy.    As you know, purely sensory or demyelinating radiculopathies and chemical radiculitis may not be detected with this particular electrodiagnostic study.  This electrodiagnostic study cannot rule out small fiber polyneuropathy and dysesthesias from central pain sensitization syndromes such as fibromyalgia.  **Myotomal referral pain from trigger points is also not excluded.  I do see referral patterns for myofascial pain referred to the ear at times.   Recommendations: 1.  Follow-up with referring physician. 2.  Continue current management of symptoms.  ___________________________ Haley Fuentes FAAPMR Board Certified, American Board of Physical Medicine and Rehabilitation    Nerve Conduction Studies Anti Sensory Summary Table   Stim Site NR Peak (ms) Norm Peak (ms) P-T Amp (V) Norm P-T Amp Site1 Site2 Delta-P (ms) Dist (cm) Vel (m/s) Norm Vel (m/s)  Right Median Acr Palm Anti Sensory (2nd Digit)  31.5C  Wrist    3.1 <3.6 26.5 >10 Wrist Palm 1.3 0.0    Palm    1.8 <2.0 43.5  Right Radial Anti Sensory (Base 1st Digit)  31.4C  Wrist    2.2 <3.1 21.1  Wrist Base 1st Digit 2.2 0.0    Right Ulnar Anti Sensory (5th Digit)  31.6C  Wrist    3.1 <3.7 24.8 >15.0 Wrist 5th Digit 3.1 14.0 45 >38   Motor Summary Table   Stim Site NR Onset (ms) Norm Onset (ms) O-P Amp  (mV) Norm O-P Amp Site1 Site2 Delta-0 (ms) Dist (cm) Vel (m/s) Norm Vel (m/s)  Right Median Motor (Abd Poll Brev)  32C  Wrist    2.7 <4.2 12.0 >5 Elbow Wrist 3.9 20.0 51 >50  Elbow    6.6  10.1         Right Ulnar Motor (Abd Dig Min)  32.3C  Wrist    2.4 <4.2 10.8 >3 B Elbow Wrist 3.2 19.0 59 >53  B Elbow    5.6  10.8  A Elbow B Elbow 1.5 10.0 67 >53  A Elbow    7.1  10.7          EMG   Side Muscle Nerve Root Ins Act Fibs Psw Amp Dur Poly Recrt Int Fraser Din Comment  Right Abd Poll Brev Median C8-T1 Nml Nml Nml Nml Nml 0 Nml Nml   Right 1stDorInt Ulnar C8-T1 Nml Nml Nml Nml Nml 0 Nml Nml   Right PronatorTeres Median C6-7 Nml Nml Nml Nml Nml 0 Nml Nml   Right Biceps Musculocut C5-6 Nml Nml Nml Nml Nml 0 Nml Nml   Right Deltoid Axillary C5-6 Nml Nml Nml Nml Nml 0 Nml Nml     Nerve Conduction Studies Anti Sensory Left/Right Comparison   Stim Site L Lat (ms) R Lat (ms) L-R Lat (ms) L Amp (V) R Amp (V) L-R Amp (%) Site1 Site2 L Vel (m/s) R Vel (m/s) L-R Vel (m/s)  Median Acr Palm Anti Sensory (2nd Digit)  31.5C  Wrist  3.1   26.5  Wrist Palm     Palm  1.8   43.5        Radial Anti Sensory (Base 1st Digit)  31.4C  Wrist  2.2   21.1  Wrist Base 1st Digit     Ulnar Anti Sensory (5th Digit)  31.6C  Wrist  3.1   24.8  Wrist 5th Digit  45    Motor Left/Right Comparison   Stim Site L Lat (ms) R Lat (ms) L-R Lat (ms) L Amp (mV) R Amp (mV) L-R Amp (%) Site1 Site2 L Vel (m/s) R Vel (m/s) L-R Vel (m/s)  Median Motor (Abd Poll Brev)  32C  Wrist  2.7   12.0  Elbow Wrist  51   Elbow  6.6   10.1        Ulnar Motor (Abd Dig Min)  32.3C  Wrist  2.4   10.8  B Elbow Wrist  59   B Elbow  5.6   10.8  A Elbow B Elbow  67   A Elbow  7.1   10.7           Waveforms:            Clinical History: No specialty comments available.   She reports that she quit smoking about 32 years ago. Her smoking use included cigarettes. She has never used smokeless tobacco. No results for input(s): HGBA1C,  LABURIC in the last 8760 hours.  Objective:  VS:  HT:    WT:   BMI:     BP:  HR: bpm  TEMP: ( )  RESP:  Physical Exam  Constitutional: She is oriented to person, place, and time.  Musculoskeletal:  Inspection reveals no atrophy of the bilateral APB or FDI or hand intrinsics. There is no swelling, color changes, allodynia or dystrophic changes. There is 5 out of 5 strength in the bilateral wrist extension, finger abduction and long finger flexion. There is intact sensation to light touch in all dermatomal and peripheral nerve distributions. There is a negative Hoffmann's test bilaterally.  Neurological: She is alert and oriented to person, place, and time. She exhibits normal muscle tone. Coordination normal.    Ortho Exam Imaging: No results found.  Past Medical/Family/Surgical/Social History: Medications & Allergies reviewed per EMR, new medications updated. Patient Active Problem List   Diagnosis Date Noted  . Osteopenia 05/04/2017  . Nonspecific abnormal electrocardiogram (ECG) (EKG) 04/14/2015  . TMJ (temporomandibular joint syndrome) 04/11/2015  . Preventative health care 01/30/2015  . Arthralgia 01/30/2015  . Tendonitis, Achilles, right 09/04/2014  . Carpal tunnel syndrome 10/08/2013  . GERD (gastroesophageal reflux disease) 01/14/2013  . Allergic rhinitis 02/17/2012  . Low back pain 09/23/2011  . Anemia 04/29/2011  . INSOMNIA, CHRONIC 09/17/2010  . Hyperlipidemia 12/02/2008  . Depression 11/10/2007  . HEMORRHOIDS 11/10/2007   Past Medical History:  Diagnosis Date  . Anemia    hx of iron deficient anemia  . Depression   . Gastric polyp   . GERD (gastroesophageal reflux disease)   . Hemorrhoids   . Joint pain   . Osteopenia 05/04/2017  . Postmenopausal    Family History  Problem Relation Age of Onset  . Diabetes Mother   . Heart disease Mother   . Cancer Mother 74       breast cancer  . Stroke Father   . Diabetes Father   . Heart disease Father         CABG, AVR, A-Fib  . Hepatitis Brother   . Alcohol abuse Brother   . Raynaud syndrome Sister   . Healthy Daughter   . Depression Son   . Anxiety disorder Son   . Healthy Son    Past Surgical History:  Procedure Laterality Date  . ENDOMETRIAL ABLATION  2007   Novasure  . TUBAL LIGATION  1983  . TUBAL LIGATION     Social History   Occupational History  . 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 Smoking status: Former Smoker    Types: Cigarettes    Last attempt to quit: 07/04/1985    Years since quitting: 32.5  . Smokeless tobacco: Never Used  Substance and Sexual Activity  . Alcohol use: Yes    Alcohol/week: 0.0 oz    Comment: weekends  . Drug use: No  . Sexual activity: Not on file

## 2017-12-29 NOTE — Procedures (Signed)
EMG & NCV Findings: All nerve conduction studies (as indicated in the following tables) were within normal limits.    All examined muscles (as indicated in the following table) showed no evidence of electrical instability.    Impression: Essentially NORMAL electrodiagnostic study of the right upper limb.  There is no significant electrodiagnostic evidence of nerve entrapment, brachial plexopathy or cervical radiculopathy.    As you know, purely sensory or demyelinating radiculopathies and chemical radiculitis may not be detected with this particular electrodiagnostic study.  This electrodiagnostic study cannot rule out small fiber polyneuropathy and dysesthesias from central pain sensitization syndromes such as fibromyalgia.  **Myotomal referral pain from trigger points is also not excluded.  I do see referral patterns for myofascial pain referred to the ear at times.   Recommendations: 1.  Follow-up with referring physician. 2.  Continue current management of symptoms.  ___________________________ Laurence Spates FAAPMR Board Certified, American Board of Physical Medicine and Rehabilitation    Nerve Conduction Studies Anti Sensory Summary Table   Stim Site NR Peak (ms) Norm Peak (ms) P-T Amp (V) Norm P-T Amp Site1 Site2 Delta-P (ms) Dist (cm) Vel (m/s) Norm Vel (m/s)  Right Median Acr Palm Anti Sensory (2nd Digit)  31.5C  Wrist    3.1 <3.6 26.5 >10 Wrist Palm 1.3 0.0    Palm    1.8 <2.0 43.5         Right Radial Anti Sensory (Base 1st Digit)  31.4C  Wrist    2.2 <3.1 21.1  Wrist Base 1st Digit 2.2 0.0    Right Ulnar Anti Sensory (5th Digit)  31.6C  Wrist    3.1 <3.7 24.8 >15.0 Wrist 5th Digit 3.1 14.0 45 >38   Motor Summary Table   Stim Site NR Onset (ms) Norm Onset (ms) O-P Amp (mV) Norm O-P Amp Site1 Site2 Delta-0 (ms) Dist (cm) Vel (m/s) Norm Vel (m/s)  Right Median Motor (Abd Poll Brev)  32C  Wrist    2.7 <4.2 12.0 >5 Elbow Wrist 3.9 20.0 51 >50  Elbow    6.6  10.1          Right Ulnar Motor (Abd Dig Min)  32.3C  Wrist    2.4 <4.2 10.8 >3 B Elbow Wrist 3.2 19.0 59 >53  B Elbow    5.6  10.8  A Elbow B Elbow 1.5 10.0 67 >53  A Elbow    7.1  10.7          EMG   Side Muscle Nerve Root Ins Act Fibs Psw Amp Dur Poly Recrt Int Fraser Din Comment  Right Abd Poll Brev Median C8-T1 Nml Nml Nml Nml Nml 0 Nml Nml   Right 1stDorInt Ulnar C8-T1 Nml Nml Nml Nml Nml 0 Nml Nml   Right PronatorTeres Median C6-7 Nml Nml Nml Nml Nml 0 Nml Nml   Right Biceps Musculocut C5-6 Nml Nml Nml Nml Nml 0 Nml Nml   Right Deltoid Axillary C5-6 Nml Nml Nml Nml Nml 0 Nml Nml     Nerve Conduction Studies Anti Sensory Left/Right Comparison   Stim Site L Lat (ms) R Lat (ms) L-R Lat (ms) L Amp (V) R Amp (V) L-R Amp (%) Site1 Site2 L Vel (m/s) R Vel (m/s) L-R Vel (m/s)  Median Acr Palm Anti Sensory (2nd Digit)  31.5C  Wrist  3.1   26.5  Wrist Palm     Palm  1.8   43.5        Radial Anti Sensory [facility]  1st Digit)  31.4C  Wrist  2.2   21.1  Wrist Base 1st Digit     Ulnar Anti Sensory (5th Digit)  31.6C  Wrist  3.1   24.8  Wrist 5th Digit  45    Motor Left/Right Comparison   Stim Site L Lat (ms) R Lat (ms) L-R Lat (ms) L Amp (mV) R Amp (mV) L-R Amp (%) Site1 Site2 L Vel (m/s) R Vel (m/s) L-R Vel (m/s)  Median Motor (Abd Poll Brev)  32C  Wrist  2.7   12.0  Elbow Wrist  51   Elbow  6.6   10.1        Ulnar Motor (Abd Dig Min)  32.3C  Wrist  2.4   10.8  B Elbow Wrist  59   B Elbow  5.6   10.8  A Elbow B Elbow  67   A Elbow  7.1   10.7           Waveforms:

## 2018-01-05 NOTE — Progress Notes (Signed)
Office Visit Note  Patient: Haley Fuentes             Date of Birth: Mar 29, 1961           MRN: [id number]             PCP: [name], NP Referring: [name], NP Visit Date: 01/16/2018 Occupation: '@GUAROCC' @    Subjective:  Right Achilles tendinitis and right TMJ pain.   History of Present Illness: Haley Fuentes is a 57 y.o. female with history of osteoarthritis and positive ANA.  She states she has been having discomfort in her right TMJ.  She also has recurrence of right Achilles tendinitis.  She continues to have paresthesias in her right arm.  Her neck and lower back pain persist.  Activities of Daily Living:  Patient reports morning stiffness for 30-45 minutes.   Patient Reports nocturnal pain.  Difficulty dressing/grooming: Denies Difficulty climbing stairs: Denies Difficulty getting out of chair: Denies Difficulty using hands for taps, buttons, cutlery, and/or writing: Denies   Review of Systems  Constitutional: Negative for fatigue, night sweats, weight gain and weight loss.  HENT: Negative for mouth sores, trouble swallowing, trouble swallowing, mouth dryness and nose dryness.   Eyes: Negative for pain, redness, visual disturbance and dryness.  Respiratory: Negative for cough, shortness of breath and difficulty breathing.   Cardiovascular: Negative for chest pain, palpitations, hypertension, irregular heartbeat and swelling in legs/feet.  Gastrointestinal: Negative for blood in stool, constipation and diarrhea.  Endocrine: Negative for increased urination.  Genitourinary: Negative for vaginal dryness.  Musculoskeletal: Positive for arthralgias, joint pain, myalgias, muscle weakness, morning stiffness, muscle tenderness and myalgias. Negative for joint swelling.  Skin: Negative for color change, rash, hair loss, skin tightness, ulcers and sensitivity to sunlight.  Allergic/Immunologic: Negative for susceptible to infections.  Neurological: Positive for  numbness and weakness. Negative for dizziness, headaches, memory loss and night sweats.  Hematological: Negative for swollen glands.  Psychiatric/Behavioral: Positive for depressed mood and sleep disturbance. The patient is not nervous/anxious.     PMFS History:  Patient Active Problem List   Diagnosis Date Noted  . Osteopenia 05/04/2017  . Nonspecific abnormal electrocardiogram (ECG) (EKG) 04/14/2015  . TMJ (temporomandibular joint syndrome) 04/11/2015  . Preventative health care 01/30/2015  . Arthralgia 01/30/2015  . Tendonitis, Achilles, right 09/04/2014  . Carpal tunnel syndrome 10/08/2013  . GERD (gastroesophageal reflux disease) 01/14/2013  . Allergic rhinitis 02/17/2012  . Low back pain 09/23/2011  . Anemia 04/29/2011  . INSOMNIA, CHRONIC 09/17/2010  . Hyperlipidemia 12/02/2008  . Depression 11/10/2007  . HEMORRHOIDS 11/10/2007    Past Medical History:  Diagnosis Date  . Anemia    hx of iron deficient anemia  . Depression   . Gastric polyp   . GERD (gastroesophageal reflux disease)   . Hemorrhoids   . Joint pain   . Osteopenia 05/04/2017  . Postmenopausal     Family History  Problem Relation Age of Onset  . Diabetes Mother   . Heart disease Mother   . Cancer Mother 17       breast cancer  . Stroke Father   . Diabetes Father   . Heart disease Father        CABG, AVR, A-Fib  . Hepatitis Brother   . Alcohol abuse Brother   . Raynaud syndrome Sister   . Healthy Daughter   . Depression Son   . Anxiety disorder Son   . Healthy Son    Past Surgical History:  Procedure  Laterality Date  . ENDOMETRIAL ABLATION  2007   Novasure  . TUBAL LIGATION  1983  . 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         Objective: Vital Signs: BP 119/87 (BP Location: Left Arm, Patient Position: Sitting, Cuff Size: Normal)   Pulse 61   Ht '5\' 4"'  (1.626 m)   Wt 166 lb (75.3 kg)   BMI 28.49 kg/m    Physical Exam  Constitutional: She is oriented to person, place, and time. She appears well-developed and well-nourished.  HENT:  Head: Normocephalic and atraumatic.  Eyes: Conjunctivae and EOM are normal.  Neck: Normal range of motion.  Cardiovascular: Normal rate, regular rhythm, normal heart sounds and intact distal pulses.  Pulmonary/Chest: Effort normal and breath sounds normal.  Abdominal: Soft. Bowel sounds are normal.  Lymphadenopathy:    She has no cervical adenopathy.  Neurological: She is alert and oriented to person, place, and time.  Skin: Skin is warm and dry. Capillary refill takes less than 2 seconds.  Psychiatric: She has a normal mood and affect. Her behavior is normal.  Nursing note and vitals reviewed.    Musculoskeletal Exam: C-spine she has discomfort range of motion and trapezius spasm.  She also has some discomfort range of motion of lumbar spine.  Shoulder joints, elbow joints, wrist joints, MCPs PIPs and DIPs been good range of motion with no synovitis.  Hip joints, knee joints, ankles MTPs PIPs were in good range of motion with no synovitis.  She has some DIP PIP thickening in her hands and feet.  She has tenderness on palpation of her right Achilles tendon.  CDAI Exam: No CDAI exam completed.    Investigation: Findings:  December 05, 2017 AVISE index: -1.1, ANA 1: 320 nucleolar pattern, anti-carbon related protein IgG 20 borderline positive  Component     Latest Ref Rng & Units 09/22/2017  ANA Pattern 1      HOMOGENEOUS (A)  ANA Titer 1     titer 1:320 (H)  Anti Nuclear Antibody(ANA)     NEGATIVE POSITIVE (A)  RA Latex Turbid.     <14 IU/mL <14  Sed Rate     0 - 30 mm/hr 9   CBC Latest Ref Rng & Units 05/04/2017 03/21/2016 04/10/2015  WBC 4.0 - 10.5 K/uL 6.9 7.2 6.8   Hemoglobin 12.0 - 15.0 g/dL 12.2 12.1 11.4(L)  Hematocrit 36.0 - 46.0 % 36.6 35.5(L) 33.2(L)  Platelets 150.0 - 400.0 K/uL 254.0 246.0 236.0   CMP Latest Ref Rng & Units 05/04/2017 03/21/2016 04/10/2015  Glucose 70 - 99 mg/dL 93 90 96  BUN 6 - 23 mg/dL '21 23 16  ' Creatinine 0.40 - 1.20 mg/dL 0.65 0.58 0.71  Sodium 135 - 145 mEq/L 141 140 142  Potassium 3.5 - 5.1 mEq/L 4.4 3.8 4.0  Chloride 96 - 112 mEq/L 106 104 108  CO2 19 - 32 mEq/L '29 29 27  ' Calcium 8.4 - 10.5 mg/dL 9.2 9.4 9.1  Total Protein 6.0 - 8.3 g/dL 7.0 6.9 -  Total Bilirubin 0.2 - 1.2 mg/dL 0.5 0.6 -  Alkaline Phos 39 - 117 U/L 64 63 -  AST 0 - 37 U/L 14 17 -  ALT 0 - 35 U/L 14 23 -     Imaging: No results found.  Speciality Comments: No specialty comments available.    Procedures:  No procedures performed Allergies: Hydrocodone-acetaminophen; Hydrocodone-acetaminophen; Omni-pac; and Voltaren [diclofenac]   Assessment / Plan:     Visit Diagnoses: Positive ANA (antinuclear antibody) - ANA 1:320 Homogeneous no clinical signs of autoimmune disease, AVISE index was -1.1.  Her anti-carbonated antibodies are borderline positive.  Which can be associated with rheumatoid arthritis.  She had no synovitis in her joints today.  Primary osteoarthritis of both knees-she has chronic pain and discomfort.  Knee joint exercises were discussed and a handout was given.  DDD (degenerative disc disease), cervical-she continues to have a lot of discomfort in her cervical spine.  I have referred her to physical therapy for exercises.  Paresthesia of right arm -she had nerve conduction velocities by Dr. Ernestina Patches which were unremarkable.  I will see response to physical therapy.  If she has persistent symptoms we may consider MRI of her cervical spine.  Spondylosis of lumbar spine-she has chronic pain.  Tendonitis, Achilles, right - Plan: HLA-B27 antigen.  I will refer her to physical therapy.  I have also given her a list of natural  anti-inflammatories.  She has been having a lot of discomfort and I will call in prednisone starting at 20 mg for 2 days, 15 mg for 2 days, 10 for 2 days and 5 for 2 days.  Side effects of prednisone were discussed.  We will avoid NSAIDs as she has history of gastritis.  I have given her prescription for Voltaren gel that can be used topically.  TMJ (temporomandibular joint syndrome)-she continues to have some discomfort in her bilateral TMJs off and on.  Osteopenia of multiple sites-she is on calcium and vitamin D.  History of gastroesophageal reflux (GERD)  History of hyperlipidemia  History of depression  History of gastric polyp    Orders: Orders Placed This Encounter  Procedures  . HLA-B27 antigen   Meds ordered this encounter  Medications  . diclofenac sodium (VOLTAREN) 1 % GEL    Sig: Apply 3 gm to 3 large joints up to 3 times a day.Dispense 3 tubes with 3 refills.    Dispense:  3 Tube    Refill:  1  . predniSONE (DELTASONE) 5 MG tablet    Sig: Take 4 tablets by mouth x2 days, 3 tablets x2 days, 2 tablets x2 days, 1 tablet x2 days.    Dispense:  20 tablet    Refill:  0    Face-to-face time spent with patient was 30 minutes. Greater than 50% of time was spent in counseling and coordination of care.  Follow-Up Instructions: Return in about 3 months (around 04/18/2018) for Osteoarthritis, +ANA.   Bo Merino, MD  Note - This record has been created using Editor, commissioning.  Chart creation errors have been sought, but may not always  have been located. Such creation errors do not reflect on  the standard of medical care.

## 2018-01-16 ENCOUNTER — Telehealth: Payer: Self-pay | Admitting: Rheumatology

## 2018-01-16 ENCOUNTER — Encounter: Payer: Self-pay | Admitting: Rheumatology

## 2018-01-16 ENCOUNTER — Ambulatory Visit: Payer: BLUE CROSS/BLUE SHIELD | Admitting: Rheumatology

## 2018-01-16 VITALS — BP 119/87 | HR 61 | Ht 64.0 in | Wt 166.0 lb

## 2018-01-16 DIAGNOSIS — M8589 Other specified disorders of bone density and structure, multiple sites: Secondary | ICD-10-CM

## 2018-01-16 DIAGNOSIS — Z8659 Personal history of other mental and behavioral disorders: Secondary | ICD-10-CM | POA: Diagnosis not present

## 2018-01-16 DIAGNOSIS — M26609 Unspecified temporomandibular joint disorder, unspecified side: Secondary | ICD-10-CM

## 2018-01-16 DIAGNOSIS — M170 Bilateral primary osteoarthritis of knee: Secondary | ICD-10-CM | POA: Diagnosis not present

## 2018-01-16 DIAGNOSIS — R202 Paresthesia of skin: Secondary | ICD-10-CM

## 2018-01-16 DIAGNOSIS — R768 Other specified abnormal immunological findings in serum: Secondary | ICD-10-CM | POA: Diagnosis not present

## 2018-01-16 DIAGNOSIS — Z8719 Personal history of other diseases of the digestive system: Secondary | ICD-10-CM

## 2018-01-16 DIAGNOSIS — M47816 Spondylosis without myelopathy or radiculopathy, lumbar region: Secondary | ICD-10-CM | POA: Diagnosis not present

## 2018-01-16 DIAGNOSIS — R7689 Other specified abnormal immunological findings in serum: Secondary | ICD-10-CM

## 2018-01-16 DIAGNOSIS — M7661 Achilles tendinitis, right leg: Secondary | ICD-10-CM

## 2018-01-16 DIAGNOSIS — M5030 Other cervical disc degeneration, unspecified cervical region: Secondary | ICD-10-CM

## 2018-01-16 DIAGNOSIS — Z8639 Personal history of other endocrine, nutritional and metabolic disease: Secondary | ICD-10-CM

## 2018-01-16 MED ORDER — DICLOFENAC SODIUM 1 % TD GEL: TRANSDERMAL | 1 refills | Status: DC

## 2018-01-16 MED ORDER — PREDNISONE 5 MG PO TABS: ORAL_TABLET | ORAL | 0 refills | Status: DC

## 2018-01-16 NOTE — Patient Instructions (Addendum)
Natural anti-inflammatories  You can purchase these at [employer], [employer] or online.  . Turmeric (capsules)  . Ginger (ginger root or capsules)  . Omega 3 (Fish, flax seeds, chia seeds, walnuts, almonds)  . Tart cherry (dried or extract)   Patient should be under the care of a physician while taking these supplements. This may not be reproduced without the permission of Dr. Bo Merino.   Cervical Strain and Sprain Rehab Ask your health care provider which exercises are safe for you. Do exercises exactly as told by your health care provider and adjust them as directed. It is normal to feel mild stretching, pulling, tightness, or discomfort as you do these exercises, but you should stop right away if you feel sudden pain or your pain gets worse.Do not begin these exercises until told by your health care provider. Stretching and range of motion exercises These exercises warm up your muscles and joints and improve the movement and flexibility of your neck. These exercises also help to relieve pain, numbness, and tingling. Exercise A: Cervical side bend  1. Using good posture, sit on a stable chair or stand up. 2. Without moving your shoulders, slowly tilt your left / right ear to your shoulder until you feel a stretch in your neck muscles. You should be looking straight ahead. 3. Hold for __________ seconds. 4. Repeat with the other side of your neck. Repeat __________ times. Complete this exercise __________ times a day. Exercise B: Cervical rotation  1. Using good posture, sit on a stable chair or stand up. 2. Slowly turn your head to the side as if you are looking over your left / right shoulder. ? Keep your eyes level with the ground. ? Stop when you feel a stretch along the side and the back of your neck. 3. Hold for __________ seconds. 4. Repeat this by turning to your other side. Repeat __________ times. Complete this exercise __________ times a day. Exercise C:  Thoracic extension and pectoral stretch 1. Roll a towel or a small blanket so it is about 4 inches (10 cm) in diameter. 2. Lie down on your back on a firm surface. 3. Put the towel lengthwise, under your spine in the middle of your back. It should not be not under your shoulder blades. The towel should line up with your spine from your middle back to your lower back. 4. Put your hands behind your head and let your elbows fall out to your sides. 5. Hold for __________ seconds. Repeat __________ times. Complete this exercise __________ times a day. Strengthening exercises These exercises build strength and endurance in your neck. Endurance is the ability to use your muscles for a long time, even after your muscles get tired. Exercise D: Upper cervical flexion, isometric 1. Lie on your back with a thin pillow behind your head and a small rolled-up towel under your neck. 2. Gently tuck your chin toward your chest and nod your head down to look toward your feet. Do not lift your head off the pillow. 3. Hold for __________ seconds. 4. Release the tension slowly. Relax your neck muscles completely before you repeat this exercise. Repeat __________ times. Complete this exercise __________ times a day. Exercise E: Cervical extension, isometric  1. Stand about 6 inches (15 cm) away from a wall, with your back facing the wall. 2. Place a soft object, about 6-8 inches (15-20 cm) in diameter, between the back of your head and the wall. A soft object could be  a small pillow, a ball, or a folded towel. 3. Gently tilt your head back and press into the soft object. Keep your jaw and forehead relaxed. 4. Hold for __________ seconds. 5. Release the tension slowly. Relax your neck muscles completely before you repeat this exercise. Repeat __________ times. Complete this exercise __________ times a day. Posture and body mechanics  Body mechanics refers to the movements and positions of your body while you do  your daily activities. Posture is part of body mechanics. Good posture and healthy body mechanics can help to relieve stress in your body's tissues and joints. Good  Knee Exercises Ask your health care provider which exercises are safe for you. Do exercises exactly as told by your health care provider and adjust them as directed. It is normal to feel mild stretching, pulling, tightness, or discomfort as you do these exercises, but you should stop right away if you feel sudden pain or your pain gets worse.Do not begin these exercises until told by your health care provider. STRETCHING AND RANGE OF MOTION EXERCISES These exercises warm up your muscles and joints and improve the movement and flexibility of your knee. These exercises also help to relieve pain, numbness, and tingling. Exercise A: Knee Extension, Prone 1. Lie on your abdomen on a bed. 2. Place your left / right knee just beyond the edge of the surface so your knee is not on the bed. You can put a towel under your left / right thigh just above your knee for comfort. 3. Relax your leg muscles and allow gravity to straighten your knee. You should feel a stretch behind your left / right knee. 4. Hold this position for __________ seconds. 5. Scoot up so your knee is supported between repetitions. Repeat __________ times. Complete this stretch __________ times a day. Exercise B: Knee Flexion, Active  1. Lie on your back with both knees straight. If this causes back discomfort, bend your left / right knee so your foot is flat on the floor. 2. Slowly slide your left / right heel back toward your buttocks until you feel a gentle stretch in the front of your knee or thigh. 3. Hold this position for __________ seconds. 4. Slowly slide your left / right heel back to the starting position. Repeat __________ times. Complete this exercise __________ times a day. Exercise C: Quadriceps, Prone  1. Lie on your abdomen on a firm surface, such as a bed  or padded floor. 2. Bend your left / right knee and hold your ankle. If you cannot reach your ankle or pant leg, loop a belt around your foot and grab the belt instead. 3. Gently pull your heel toward your buttocks. Your knee should not slide out to the side. You should feel a stretch in the front of your thigh and knee. 4. Hold this position for __________ seconds. Repeat __________ times. Complete this stretch __________ times a day. Exercise D: Hamstring, Supine 1. Lie on your back. 2. Loop a belt or towel over the ball of your left / right foot. The ball of your foot is on the walking surface, right under your toes. 3. Straighten your left / right knee and slowly pull on the belt to raise your leg until you feel a gentle stretch behind your knee. ? Do not let your left / right knee bend while you do this. ? Keep your other leg flat on the floor. 4. Hold this position for __________ seconds. Repeat __________ times. Complete this  stretch __________ times a day. STRENGTHENING EXERCISES These exercises build strength and endurance in your knee. Endurance is the ability to use your muscles for a long time, even after they get tired. Exercise E: Quadriceps, Isometric  1. Lie on your back with your left / right leg extended and your other knee bent. Put a rolled towel or small pillow under your knee if told by your health care provider. 2. Slowly tense the muscles in the front of your left / right thigh. You should see your kneecap slide up toward your hip or see increased dimpling just above the knee. This motion will push the back of the knee toward the floor. 3. For __________ seconds, keep the muscle as tight as you can without increasing your pain. 4. Relax the muscles slowly and completely. Repeat __________ times. Complete this exercise __________ times a day. Exercise F: Straight Leg Raises - Quadriceps 1. Lie on your back with your left / right leg extended and your other knee  bent. 2. Tense the muscles in the front of your left / right thigh. You should see your kneecap slide up or see increased dimpling just above the knee. Your thigh may even shake a bit. 3. Keep these muscles tight as you raise your leg 4-6 inches (10-15 cm) off the floor. Do not let your knee bend. 4. Hold this position for __________ seconds. 5. Keep these muscles tense as you lower your leg. 6. Relax your muscles slowly and completely after each repetition. Repeat __________ times. Complete this exercise __________ times a day. Exercise G: Hamstring, Isometric 1. Lie on your back on a firm surface. 2. Bend your left / right knee approximately __________ degrees. 3. Dig your left / right heel into the surface as if you are trying to pull it toward your buttocks. Tighten the muscles in the back of your thighs to dig as hard as you can without increasing any pain. 4. Hold this position for __________ seconds. 5. Release the tension gradually and allow your muscles to relax completely for __________ seconds after each repetition. Repeat __________ times. Complete this exercise __________ times a day. Exercise H: Hamstring Curls  If told by your health care provider, do this exercise while wearing ankle weights. Begin with __________ weights. Then increase the weight by 1 lb (0.5 kg) increments. Do not wear ankle weights that are more than __________. 1. Lie on your abdomen with your legs straight. 2. Bend your left / right knee as far as you can without feeling pain. Keep your hips flat against the floor. 3. Hold this position for __________ seconds. 4. Slowly lower your leg to the starting position.  Repeat __________ times. Complete this exercise __________ times a day. Exercise I: Squats (Quadriceps) 1. Stand in front of a table, with your feet and knees pointing straight ahead. You may rest your hands on the table for balance but not for support. 2. Slowly bend your knees and lower your  hips like you are going to sit in a chair. ? Keep your weight over your heels, not over your toes. ? Keep your lower legs upright so they are parallel with the table legs. ? Do not let your hips go lower than your knees. ? Do not bend lower than told by your health care provider. ? If your knee pain increases, do not bend as low. 3. Hold the squat position for __________ seconds. 4. Slowly push with your legs to return to standing. Do not  use your hands to pull yourself to standing. Repeat __________ times. Complete this exercise __________ times a day. Exercise J: Wall Slides (Quadriceps)  1. Lean your back against a smooth wall or door while you walk your feet out 18-24 inches (46-61 cm) from it. 2. Place your feet hip-width apart. 3. Slowly slide down the wall or door until your knees bend __________ degrees. Keep your knees over your heels, not over your toes. Keep your knees in line with your hips. 4. Hold for __________ seconds. Repeat __________ times. Complete this exercise __________ times a day. Exercise K: Straight Leg Raises - Hip Abductors 1. Lie on your side with your left / right leg in the top position. Lie so your head, shoulder, knee, and hip line up. You may bend your bottom knee to help you keep your balance. 2. Roll your hips slightly forward so your hips are stacked directly over each other and your left / right knee is facing forward. 3. Leading with your heel, lift your top leg 4-6 inches (10-15 cm). You should feel the muscles in your outer hip lifting. ? Do not let your foot drift forward. ? Do not let your knee roll toward the ceiling. 4. Hold this position for __________ seconds. 5. Slowly return your leg to the starting position. 6. Let your muscles relax completely after each repetition. Repeat __________ times. Complete this exercise __________ times a day. Exercise L: Straight Leg Raises - Hip Extensors 1. Lie on your abdomen on a firm surface. You can put a  pillow under your hips if that is more comfortable. 2. Tense the muscles in your buttocks and lift your left / right leg about 4-6 inches (10-15 cm). Keep your knee straight as you lift your leg. 3. Hold this position for __________ seconds. 4. Slowly lower your leg to the starting position. 5. Let your leg relax completely after each repetition. Repeat __________ times. Complete this exercise __________ times a day. This information is not intended to replace advice given to you by your health care provider. Make sure you discuss any questions you have with your health care provider. Document Released: 05/04/2005 Document Revised: 03/14/2016 Document Reviewed: 04/26/2015 Elsevier Interactive Patient Education  2018 [employer]. posture means that your spine is in its natural S-curve position (your spine is neutral), your shoulders are pulled back slightly, and your head is not tipped forward. The following are general guidelines for applying improved posture and body mechanics to your everyday activities. Standing  When standing, keep your spine neutral and keep your feet about hip-width apart. Keep a slight bend in your knees. Your ears, shoulders, and hips should line up.  When you do a task in which you stand in one place for a long time, place one foot up on a stable object that is 2-4 inches (5-10 cm) high, such as a footstool. This helps keep your spine neutral. Sitting   When sitting, keep your spine neutral and your keep feet flat on the floor. Use a footrest, if necessary, and keep your thighs parallel to the floor. Avoid rounding your shoulders, and avoid tilting your head forward.  When working at a desk or a computer, keep your desk at a height where your hands are slightly lower than your elbows. Slide your chair under your desk so you are close enough to maintain good posture.  When working at a computer, place your monitor at a height where you are looking straight ahead and  you  do not have to tilt your head forward or downward to look at the screen. Resting When lying down and resting, avoid positions that are most painful for you. Try to support your neck in a neutral position. You can use a contour pillow or a small rolled-up towel. Your pillow should support your neck but not push on it. This information is not intended to replace advice given to you by your health care provider. Make sure you discuss any questions you have with your health care provider. Document Released: 06/20/2005 Document Revised: 02/25/2016 Document Reviewed: 05/27/2015 Elsevier Interactive Patient Education  [employer].

## 2018-01-16 NOTE — Telephone Encounter (Signed)
Patient states she received an email from her pharmacy stating that a prior authorization is needed for her prescription of Diclofenac Sodium 1% Gel before it will be filled.

## 2018-01-17 LAB — HLA-B27 ANTIGEN: HLA-B27 ANTIGEN: NEGATIVE

## 2018-01-17 NOTE — Telephone Encounter (Signed)
Prior authorization has been submitted via cover my meds. Will updated once we have received a response.

## 2018-01-18 ENCOUNTER — Telehealth (INDEPENDENT_AMBULATORY_CARE_PROVIDER_SITE_OTHER): Payer: Self-pay | Admitting: Radiology

## 2018-01-18 NOTE — Telephone Encounter (Signed)
Diclofenac sodium gel   Approvedon July 17  Effective from 01/17/2018 through 07/03/2038

## 2018-01-22 ENCOUNTER — Ambulatory Visit: Payer: BLUE CROSS/BLUE SHIELD | Admitting: Physical Therapy

## 2018-01-24 ENCOUNTER — Telehealth: Payer: Self-pay | Admitting: Family

## 2018-01-24 NOTE — Telephone Encounter (Signed)
Copied from [place] [id number]. Topic: Quick Communication - See Telephone Encounter >> Jan 24, 2018 11:18 AM Ivar Drape wrote: CRM for notification. See Telephone encounter for: 01/24/18. [place] w/[facility] [phone] would like a refill on the following prescriptions for the patient for a 90day supply: 1) FLUoxetine (PROZAC) 20 MG capsule 2) omeprazole (PRILOSEC) 40 MG capsule

## 2018-01-25 ENCOUNTER — Other Ambulatory Visit: Payer: Self-pay

## 2018-01-25 DIAGNOSIS — M5030 Other cervical disc degeneration, unspecified cervical region: Secondary | ICD-10-CM | POA: Diagnosis not present

## 2018-01-25 DIAGNOSIS — M7661 Achilles tendinitis, right leg: Secondary | ICD-10-CM | POA: Diagnosis not present

## 2018-01-25 MED ORDER — OMEPRAZOLE 40 MG PO CPDR
40.0000 mg | DELAYED_RELEASE_CAPSULE | Freq: Every day | ORAL | 0 refills | Status: DC
Start: 2018-01-25 — End: 2018-03-02

## 2018-01-25 MED ORDER — FLUOXETINE HCL 20 MG PO CAPS: 20.0000 mg | ORAL_CAPSULE | Freq: Every day | ORAL | 0 refills | Status: DC

## 2018-01-29 DIAGNOSIS — R200 Anesthesia of skin: Secondary | ICD-10-CM | POA: Diagnosis not present

## 2018-01-29 DIAGNOSIS — M7661 Achilles tendinitis, right leg: Secondary | ICD-10-CM | POA: Diagnosis not present

## 2018-01-29 DIAGNOSIS — R202 Paresthesia of skin: Secondary | ICD-10-CM | POA: Diagnosis not present

## 2018-01-29 DIAGNOSIS — M542 Cervicalgia: Secondary | ICD-10-CM | POA: Diagnosis not present

## 2018-01-31 ENCOUNTER — Encounter: Payer: Self-pay | Admitting: Family

## 2018-01-31 DIAGNOSIS — M542 Cervicalgia: Secondary | ICD-10-CM | POA: Diagnosis not present

## 2018-01-31 DIAGNOSIS — M7661 Achilles tendinitis, right leg: Secondary | ICD-10-CM | POA: Diagnosis not present

## 2018-01-31 DIAGNOSIS — M5030 Other cervical disc degeneration, unspecified cervical region: Secondary | ICD-10-CM | POA: Diagnosis not present

## 2018-01-31 DIAGNOSIS — R200 Anesthesia of skin: Secondary | ICD-10-CM | POA: Diagnosis not present

## 2018-02-05 DIAGNOSIS — M7661 Achilles tendinitis, right leg: Secondary | ICD-10-CM | POA: Diagnosis not present

## 2018-02-05 DIAGNOSIS — M5030 Other cervical disc degeneration, unspecified cervical region: Secondary | ICD-10-CM | POA: Diagnosis not present

## 2018-02-07 DIAGNOSIS — M5030 Other cervical disc degeneration, unspecified cervical region: Secondary | ICD-10-CM | POA: Diagnosis not present

## 2018-02-07 DIAGNOSIS — M7661 Achilles tendinitis, right leg: Secondary | ICD-10-CM | POA: Diagnosis not present

## 2018-02-13 DIAGNOSIS — R202 Paresthesia of skin: Secondary | ICD-10-CM | POA: Diagnosis not present

## 2018-02-13 DIAGNOSIS — M7661 Achilles tendinitis, right leg: Secondary | ICD-10-CM | POA: Diagnosis not present

## 2018-02-13 DIAGNOSIS — M542 Cervicalgia: Secondary | ICD-10-CM | POA: Diagnosis not present

## 2018-02-13 DIAGNOSIS — R200 Anesthesia of skin: Secondary | ICD-10-CM | POA: Diagnosis not present

## 2018-02-16 DIAGNOSIS — M542 Cervicalgia: Secondary | ICD-10-CM | POA: Diagnosis not present

## 2018-02-16 DIAGNOSIS — M7661 Achilles tendinitis, right leg: Secondary | ICD-10-CM | POA: Diagnosis not present

## 2018-02-16 DIAGNOSIS — M5030 Other cervical disc degeneration, unspecified cervical region: Secondary | ICD-10-CM | POA: Diagnosis not present

## 2018-02-20 DIAGNOSIS — M47812 Spondylosis without myelopathy or radiculopathy, cervical region: Secondary | ICD-10-CM | POA: Diagnosis not present

## 2018-02-20 DIAGNOSIS — M5030 Other cervical disc degeneration, unspecified cervical region: Secondary | ICD-10-CM | POA: Diagnosis not present

## 2018-02-20 DIAGNOSIS — M7661 Achilles tendinitis, right leg: Secondary | ICD-10-CM | POA: Diagnosis not present

## 2018-02-22 DIAGNOSIS — R200 Anesthesia of skin: Secondary | ICD-10-CM | POA: Diagnosis not present

## 2018-02-22 DIAGNOSIS — M542 Cervicalgia: Secondary | ICD-10-CM | POA: Diagnosis not present

## 2018-02-22 DIAGNOSIS — M5030 Other cervical disc degeneration, unspecified cervical region: Secondary | ICD-10-CM | POA: Diagnosis not present

## 2018-02-22 DIAGNOSIS — M7661 Achilles tendinitis, right leg: Secondary | ICD-10-CM | POA: Diagnosis not present

## 2018-02-26 DIAGNOSIS — M5030 Other cervical disc degeneration, unspecified cervical region: Secondary | ICD-10-CM | POA: Diagnosis not present

## 2018-02-26 DIAGNOSIS — M7661 Achilles tendinitis, right leg: Secondary | ICD-10-CM | POA: Diagnosis not present

## 2018-02-28 DIAGNOSIS — M542 Cervicalgia: Secondary | ICD-10-CM | POA: Diagnosis not present

## 2018-02-28 DIAGNOSIS — M50322 Other cervical disc degeneration at C5-C6 level: Secondary | ICD-10-CM | POA: Diagnosis not present

## 2018-02-28 DIAGNOSIS — M7661 Achilles tendinitis, right leg: Secondary | ICD-10-CM | POA: Diagnosis not present

## 2018-03-01 DIAGNOSIS — H5213 Myopia, bilateral: Secondary | ICD-10-CM | POA: Diagnosis not present

## 2018-03-01 DIAGNOSIS — H524 Presbyopia: Secondary | ICD-10-CM | POA: Diagnosis not present

## 2018-03-02 ENCOUNTER — Ambulatory Visit: Payer: BLUE CROSS/BLUE SHIELD | Admitting: Family

## 2018-03-02 ENCOUNTER — Encounter: Payer: Self-pay | Admitting: Family

## 2018-03-02 VITALS — BP 121/75 | HR 65 | Temp 97.5°F | Resp 16 | Ht 64.0 in | Wt 170.0 lb

## 2018-03-02 DIAGNOSIS — R768 Other specified abnormal immunological findings in serum: Secondary | ICD-10-CM

## 2018-03-02 DIAGNOSIS — F32A Depression, unspecified: Secondary | ICD-10-CM

## 2018-03-02 DIAGNOSIS — F329 Major depressive disorder, single episode, unspecified: Secondary | ICD-10-CM | POA: Diagnosis not present

## 2018-03-02 DIAGNOSIS — K219 Gastro-esophageal reflux disease without esophagitis: Secondary | ICD-10-CM

## 2018-03-02 MED ORDER — CYCLOBENZAPRINE HCL 10 MG PO TABS: 10.0000 mg | ORAL_TABLET | Freq: Two times a day (BID) | ORAL | 1 refills | Status: DC | PRN

## 2018-03-02 MED ORDER — OMEPRAZOLE 40 MG PO CPDR: 40.0000 mg | DELAYED_RELEASE_CAPSULE | Freq: Every day | ORAL | 1 refills | Status: DC

## 2018-03-02 MED ORDER — ATORVASTATIN CALCIUM 10 MG PO TABS
10.0000 mg | ORAL_TABLET | Freq: Every day | ORAL | 1 refills | Status: DC
Start: 2018-03-02 — End: 2018-07-20

## 2018-03-02 MED ORDER — FLUOXETINE HCL 40 MG PO CAPS: 40.0000 mg | ORAL_CAPSULE | Freq: Every day | ORAL | 3 refills | Status: DC

## 2018-03-02 NOTE — Progress Notes (Signed)
Subjective:    Patient ID: Haley Fuentes, female    DOB: 1961-06-04, 57 y.o.   MRN: [id number]  HPI  Patient is a 58 year old female who presents today for follow-up of her depression.  Last visit she shared that her mother and her brother had recently passed away.  We discussed that her symptoms seemed largely situational and we discussed adding counseling.  She states that she did not establish with a counselor.  Since that time she has been dealing with stress related to her dog who is a diabetic and recently became blind.  She also reports that the fianc of her nephew was killed in May motor vehicle accident and this is been another stressor for the family.  She reports ongoing difficulty motivating.  Symptoms are about the same.  She continues Prozac.  Positive ANA-she was referred to rheumatology.  Rheumatologist recommended that she see physical therapy.  The patient notes some improvement in her symptoms with physical therapy.  GERD-we increased omeprazole from 20mg  to 40mg .   Review of Systems    see HPI  Past Medical History:  Diagnosis Date  . Anemia    hx of iron deficient anemia  . Depression   . Gastric polyp   . GERD (gastroesophageal reflux disease)   . Hemorrhoids   . Joint pain   . Osteopenia 05/04/2017  . Postmenopausal      Social History   Socioeconomic History  . Marital status: Married    Spouse name: Not on file  . Number of children: 2  . Years of education: Not on file  . Highest education level: Not on file  Occupational History  . 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 Financial resource strain: Not on file  . Food insecurity:    Worry: Not on file    Inability: Not on file  . Transportation needs:    Medical: Not on file    Non-medical: Not on file  Tobacco Use  . Smoking status: Former Smoker    Types: Cigarettes    Last attempt to quit: 07/04/1985    Years since quitting: 32.6  . Smokeless  tobacco: Never Used  Substance and Sexual Activity  . Alcohol use: Yes    Alcohol/week: 0.0 standard drinks    Comment: weekends  . Drug use: No  . Sexual activity: Not on file  Lifestyle  . Physical activity:    Days per week: Not on file    Minutes per session: Not on file  . Stress: Not on file  Relationships  . Social connections:    Talks on phone: Not on file    Gets together: Not on file    Attends religious service: Not on file    Active member of club or organization: Not on file    Attends meetings of clubs or organizations: Not on file    Relationship status: Not on file  . Intimate partner violence:    Fear of current or ex partner: Not on file    Emotionally abused: Not on file    Physically abused: Not on file    Forced sexual activity: Not on file  Other Topics Concern  . 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       Past Surgical History:  Procedure Laterality Date  . ENDOMETRIAL ABLATION  2007   [facility]  . TUBAL LIGATION  1983  . TUBAL LIGATION      Family History  Problem Relation Age of Onset  . Diabetes Mother   . Heart disease Mother   . Cancer Mother 71       breast cancer  . Stroke Father   . Diabetes Father   . Heart disease Father        CABG, AVR, A-Fib  . Hepatitis Brother   . Alcohol abuse Brother   . Raynaud syndrome Sister   . Healthy Daughter   . Depression Son   . Anxiety disorder Son   . Healthy Son     Allergies  Allergen Reactions  . Hydrocodone-Acetaminophen     "made her skin crawl"  . Hydrocodone-Acetaminophen Itching  . Omni-Pac     vomitting  . Voltaren [Diclofenac]     diarrhea    Current Outpatient Medications on File Prior to Visit  Medication Sig Dispense Refill  .  diclofenac sodium (VOLTAREN) 1 % GEL Apply 3 gm to 3 large joints up to 3 times a day.Dispense 3 tubes with 3 refills. 3 Tube 1  . ESTRACE VAGINAL 0.1 MG/GM vaginal cream Place 1 application vaginally at bedtime.  1  . estradiol (ESTRACE) 0.5 MG tablet Take 0.5 mg by mouth daily.      Marland Kitchen lidocaine (LIDODERM) 5 % Place 1 patch onto the skin daily. Remove & Discard patch within 12 hours or as directed by MD 30 patch 0  . medroxyPROGESTERone (PROVERA) 2.5 MG tablet Take 1 tablet by mouth daily.    . Multiple Vitamins-Minerals (MULTIVITAMIN WITH MINERALS) tablet 1 tablet.      . predniSONE (DELTASONE) 5 MG tablet Take 4 tablets by mouth x2 days, 3 tablets x2 days, 2 tablets x2 days, 1 tablet x2 days. 20 tablet 0  . zolpidem (AMBIEN) 10 MG tablet TAKE ONE-HALF TO ONE TABLET BY MOUTH AT BEDTIME AS NEEDED FOR SLEEP 15 tablet 0   No current facility-administered medications on file prior to visit.     BP 121/75 (BP Location: Right Arm, Cuff Size: Normal)   Pulse 65   Temp (!) 97.5 F (36.4 C) (Oral)   Resp 16   Ht 5\' 4"  (1.626 m)   Wt 170 lb (77.1 kg)   SpO2 99%   BMI 29.18 kg/m    Objective:   Physical Exam  Constitutional: She is oriented to person, place, and time. She appears well-developed and well-nourished.  Cardiovascular: Normal rate, regular rhythm and normal heart sounds.  No murmur heard. Pulmonary/Chest: Effort normal and breath sounds normal. No respiratory distress. She has no wheezes.  Musculoskeletal: She exhibits no edema.  Neurological: She is alert and oriented to person, place, and time.  Skin: Skin is warm and dry.  Psychiatric: She has a normal mood and affect. Her behavior is normal. Judgment and thought content normal.          Assessment & Plan:  Depression- this is uncontrolled.  We will increase her Prozac from 20 to 40 mg once daily.  GERD- symptoms are improved at increased dose of omeprazole.  Continue same  Positive ANA-work-up is ongoing per  rheumatology.  She is currently doing physical therapy.

## 2018-03-02 NOTE — Patient Instructions (Signed)
Please increase prozac to 40mg  daily.

## 2018-03-07 DIAGNOSIS — M5030 Other cervical disc degeneration, unspecified cervical region: Secondary | ICD-10-CM | POA: Diagnosis not present

## 2018-03-07 DIAGNOSIS — M7661 Achilles tendinitis, right leg: Secondary | ICD-10-CM | POA: Diagnosis not present

## 2018-03-12 DIAGNOSIS — M50322 Other cervical disc degeneration at C5-C6 level: Secondary | ICD-10-CM | POA: Diagnosis not present

## 2018-03-12 DIAGNOSIS — M7661 Achilles tendinitis, right leg: Secondary | ICD-10-CM | POA: Diagnosis not present

## 2018-03-19 DIAGNOSIS — M5030 Other cervical disc degeneration, unspecified cervical region: Secondary | ICD-10-CM | POA: Diagnosis not present

## 2018-03-19 DIAGNOSIS — M7661 Achilles tendinitis, right leg: Secondary | ICD-10-CM | POA: Diagnosis not present

## 2018-04-12 NOTE — Progress Notes (Deleted)
Office Visit Note  Patient: Haley Fuentes             Date of Birth: 07-07-1960           MRN: [id number]             PCP: [name], NP Referring: [name], NP Visit Date: 04/24/2018 Occupation: @GUAROCC @  Subjective:  No chief complaint on file.   History of Present Illness: Haley Fuentes is a 57 y.o. female ***   Activities of Daily Living:  Patient reports morning stiffness for *** {minute/hour:19697}.   Patient {ACTIONS;DENIES/REPORTS:21021675::"Denies"} nocturnal pain.  Difficulty dressing/grooming: {ACTIONS;DENIES/REPORTS:21021675::"Denies"} Difficulty climbing stairs: {ACTIONS;DENIES/REPORTS:21021675::"Denies"} Difficulty getting out of chair: {ACTIONS;DENIES/REPORTS:21021675::"Denies"} Difficulty using hands for taps, buttons, cutlery, and/or writing: {ACTIONS;DENIES/REPORTS:21021675::"Denies"}  No Rheumatology ROS completed.   PMFS History:  Patient Active Problem List   Diagnosis Date Noted  . Osteopenia 05/04/2017  . Nonspecific abnormal electrocardiogram (ECG) (EKG) 04/14/2015  . TMJ (temporomandibular joint syndrome) 04/11/2015  . Preventative health care 01/30/2015  . Arthralgia 01/30/2015  . Tendonitis, Achilles, right 09/04/2014  . Carpal tunnel syndrome 10/08/2013  . GERD (gastroesophageal reflux disease) 01/14/2013  . Allergic rhinitis 02/17/2012  . Low back pain 09/23/2011  . Anemia 04/29/2011  . INSOMNIA, CHRONIC 09/17/2010  . Hyperlipidemia 12/02/2008  . Depression 11/10/2007  . HEMORRHOIDS 11/10/2007    Past Medical History:  Diagnosis Date  . Anemia    hx of iron deficient anemia  . Depression   . Gastric polyp   . GERD (gastroesophageal reflux disease)   . Hemorrhoids   . Joint pain   . Osteopenia 05/04/2017  . Postmenopausal     Family History  Problem Relation Age of Onset  . Diabetes Mother   . Heart disease Mother   . Cancer Mother 27       breast cancer  . Stroke Father   . Diabetes Father   . Heart  disease Father        CABG, AVR, A-Fib  . Hepatitis Brother   . Alcohol abuse Brother   . Raynaud syndrome Sister   . Healthy Daughter   . Depression Son   . Anxiety disorder Son   . Healthy Son    Past Surgical History:  Procedure Laterality Date  . ENDOMETRIAL ABLATION  2007   Novasure  . TUBAL LIGATION  1983  . 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       Objective: Vital Signs: There were no vitals taken for this visit.   Physical Exam   Musculoskeletal Exam: ***  CDAI Exam: CDAI Score: Not documented Patient Global Assessment: Not documented; Provider Global Assessment: Not documented Swollen: Not documented; Tender: Not documented Joint Exam   Not documented   There is currently no information documented on the homunculus. Go to the Rheumatology activity and complete the homunculus joint exam.  Investigation: No additional findings.  Imaging: No results found.  Recent Labs: Lab Results  Component Value Date   WBC 6.9 05/04/2017   HGB 12.2 05/04/2017   PLT 254.0 05/04/2017   NA 141 05/04/2017   K 4.4 05/04/2017   CL 106 05/04/2017   CO2 29 05/04/2017   GLUCOSE 93 05/04/2017   BUN  21 05/04/2017   CREATININE 0.65 05/04/2017   BILITOT 0.5 05/04/2017   ALKPHOS 64 05/04/2017   AST 14 05/04/2017   ALT 14 05/04/2017   PROT 7.0 05/04/2017   ALBUMIN 4.2 05/04/2017   CALCIUM 9.2 05/04/2017   GFRAA >60 03/25/2011    Speciality Comments: No specialty comments available.  Procedures:  No procedures performed Allergies: Hydrocodone-acetaminophen; Hydrocodone-acetaminophen; Omni-pac; and Voltaren [diclofenac]   Assessment / Plan:     Visit Diagnoses: No diagnosis found.   Orders: No orders of  the defined types were placed in this encounter.  No orders of the defined types were placed in this encounter.   Face-to-face time spent with patient was *** minutes. Greater than 50% of time was spent in counseling and coordination of care.  Follow-Up Instructions: No follow-ups on file.   Earnestine Mealing, CMA  Note - This record has been created using Editor, commissioning.  Chart creation errors have been sought, but may not always  have been located. Such creation errors do not reflect on  the standard of medical care.

## 2018-04-13 ENCOUNTER — Ambulatory Visit: Payer: BLUE CROSS/BLUE SHIELD | Admitting: Family

## 2018-04-20 ENCOUNTER — Ambulatory Visit: Payer: BLUE CROSS/BLUE SHIELD | Admitting: Family

## 2018-04-20 ENCOUNTER — Encounter: Payer: Self-pay | Admitting: Family

## 2018-04-20 VITALS — BP 114/74 | HR 65 | Temp 97.6°F | Resp 16 | Ht 64.0 in | Wt 165.0 lb

## 2018-04-20 DIAGNOSIS — F329 Major depressive disorder, single episode, unspecified: Secondary | ICD-10-CM | POA: Diagnosis not present

## 2018-04-20 DIAGNOSIS — Z23 Encounter for immunization: Secondary | ICD-10-CM

## 2018-04-20 DIAGNOSIS — F32A Depression, unspecified: Secondary | ICD-10-CM

## 2018-04-20 NOTE — Progress Notes (Signed)
Subjective:    Patient ID: Haley Fuentes, female    DOB: 07-08-1960, 57 y.o.   MRN: [id number]  HPI  Haley Fuentes is a 57 yr old female who presents today for follow up of her depression. Last visit her symptoms were noted to be uncontrolled and we increased her prozac from 20mg  to 40mg .    Review of Systems See HPI  Past Medical History:  Diagnosis Date  . Anemia    hx of iron deficient anemia  . Depression   . Gastric polyp   . GERD (gastroesophageal reflux disease)   . Hemorrhoids   . Joint pain   . Osteopenia 05/04/2017  . Postmenopausal      Social History   Socioeconomic History  . Marital status: Married    Spouse name: Not on file  . Number of children: 2  . Years of education: Not on file  . Highest education level: Not on file  Occupational History  . 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 Financial resource strain: Not on file  . Food insecurity:    Worry: Not on file    Inability: Not on file  . Transportation needs:    Medical: Not on file    Non-medical: Not on file  Tobacco Use  . Smoking status: Former Smoker    Types: Cigarettes    Last attempt to quit: 07/04/1985    Years since quitting: 32.8  . Smokeless tobacco: Never Used  Substance and Sexual Activity  . Alcohol use: Yes    Alcohol/week: 0.0 standard drinks    Comment: weekends  . Drug use: No  . Sexual activity: Not on file  Lifestyle  . Physical activity:    Days per week: Not on file    Minutes per session: Not on file  . Stress: Not on file  Relationships  . Social connections:    Talks on phone: Not on file    Gets together: Not on file    Attends religious service: Not on file    Active member of club or organization: Not on file    Attends meetings of clubs or organizations: Not on file    Relationship status: Not on file  . Intimate partner violence:    Fear of current or ex partner: Not on file    Emotionally abused: Not on file   Physically abused: Not on file    Forced sexual activity: Not on file  Other Topics Concern  . 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       Past Surgical History:  Procedure Laterality Date  . ENDOMETRIAL ABLATION  2007   Novasure  . TUBAL LIGATION  1983  . TUBAL LIGATION      Family History  Problem Relation Age of Onset  . Diabetes Mother   . Heart disease Mother   . Cancer Mother 48       breast cancer  . Stroke Father   . Diabetes Father   . Heart disease Father        CABG, AVR, A-Fib  . Hepatitis Brother   . Alcohol abuse Brother   . Raynaud syndrome Sister   . Healthy Daughter   .  Depression Son   . Anxiety disorder Son   . Healthy Son     Allergies  Allergen Reactions  . Hydrocodone-Acetaminophen     "made her skin crawl"  . Hydrocodone-Acetaminophen Itching  . Omni-Pac     vomitting  . Voltaren [Diclofenac]     diarrhea    Current Outpatient Medications on File Prior to Visit  Medication Sig Dispense Refill  . atorvastatin (LIPITOR) 10 MG tablet Take 1 tablet (10 mg total) by mouth daily. 90 tablet 1  . cyclobenzaprine (FLEXERIL) 10 MG tablet Take 1 tablet (10 mg total) by mouth 2 (two) times daily as needed. 90 tablet 1  . diclofenac sodium (VOLTAREN) 1 % GEL Apply 3 gm to 3 large joints up to 3 times a day.Dispense 3 tubes with 3 refills. 3 Tube 1  . ESTRACE VAGINAL 0.1 MG/GM vaginal cream Place 1 application vaginally at bedtime.  1  . estradiol (ESTRACE) 0.5 MG tablet Take 0.5 mg by mouth daily.      Marland Kitchen FLUoxetine (PROZAC) 40 MG capsule Take 1 capsule (40 mg total) by mouth daily. 30 capsule 3  . lidocaine (LIDODERM) 5 % Place 1 patch onto the skin daily. Remove & Discard patch within 12 hours or as directed by MD 30  patch 0  . medroxyPROGESTERone (PROVERA) 2.5 MG tablet Take 1 tablet by mouth daily.    . Multiple Vitamins-Minerals (MULTIVITAMIN WITH MINERALS) tablet 1 tablet.      Marland Kitchen omeprazole (PRILOSEC) 40 MG capsule Take 1 capsule (40 mg total) by mouth daily. 90 capsule 1  . predniSONE (DELTASONE) 5 MG tablet Take 4 tablets by mouth x2 days, 3 tablets x2 days, 2 tablets x2 days, 1 tablet x2 days. 20 tablet 0  . zolpidem (AMBIEN) 10 MG tablet TAKE ONE-HALF TO ONE TABLET BY MOUTH AT BEDTIME AS NEEDED FOR SLEEP 15 tablet 0   No current facility-administered medications on file prior to visit.     BP 114/74 (BP Location: Right Arm, Patient Position: Sitting, Cuff Size: Small)   Pulse 65   Temp 97.6 F (36.4 C) (Oral)   Resp 16   Ht 5\' 4"  (1.626 m)   Wt 165 lb (74.8 kg)   SpO2 98%   BMI 28.32 kg/m       Objective:   Physical Exam  Constitutional: She is oriented to person, place, and time. She appears well-developed and well-nourished.  Neurological: She is alert and oriented to person, place, and time.  Psychiatric: She has a normal mood and affect. Her behavior is normal. Judgment and thought content normal.          Assessment & Plan:  Depression- some improvement. Still struggling with the loss of her mother and her brother.  Will plan to increase prozac from 20mg  to 40mg .  She declines counseling but will consider some journaling.     A total of 15  minutes were spent face-to-face with the patient during this encounter and over half of that time was spent on counseling and coordination of care. The patient was counseled on depression and treatment of depression.   Flu shot today

## 2018-04-24 ENCOUNTER — Ambulatory Visit: Payer: BLUE CROSS/BLUE SHIELD | Admitting: Rheumatology

## 2018-05-18 ENCOUNTER — Other Ambulatory Visit: Payer: Self-pay | Admitting: Family

## 2018-05-18 DIAGNOSIS — Z6829 Body mass index (BMI) 29.0-29.9, adult: Secondary | ICD-10-CM | POA: Diagnosis not present

## 2018-05-18 DIAGNOSIS — Z1231 Encounter for screening mammogram for malignant neoplasm of breast: Secondary | ICD-10-CM | POA: Diagnosis not present

## 2018-05-18 DIAGNOSIS — Z01419 Encounter for gynecological examination (general) (routine) without abnormal findings: Secondary | ICD-10-CM | POA: Diagnosis not present

## 2018-05-18 DIAGNOSIS — Z803 Family history of malignant neoplasm of breast: Secondary | ICD-10-CM | POA: Diagnosis not present

## 2018-05-18 LAB — HM MAMMOGRAPHY

## 2018-07-20 ENCOUNTER — Encounter: Payer: Self-pay | Admitting: Family

## 2018-07-20 ENCOUNTER — Ambulatory Visit: Payer: BLUE CROSS/BLUE SHIELD | Admitting: Family

## 2018-07-20 VITALS — BP 122/75 | HR 62 | Temp 97.8°F | Resp 16 | Ht 64.0 in | Wt 168.0 lb

## 2018-07-20 DIAGNOSIS — F32A Depression, unspecified: Secondary | ICD-10-CM

## 2018-07-20 DIAGNOSIS — F329 Major depressive disorder, single episode, unspecified: Secondary | ICD-10-CM

## 2018-07-20 MED ORDER — ATORVASTATIN CALCIUM 10 MG PO TABS: 10.0000 mg | ORAL_TABLET | Freq: Every day | ORAL | 1 refills | Status: DC

## 2018-07-20 NOTE — Progress Notes (Signed)
Subjective:    Patient ID: Haley Fuentes, female    DOB: 10/25/1960, 58 y.o.   MRN: [id number]  HPI   Patient is a 57 yr old female who presents today for follow up of her depression. We last saw her back in October 2019. At that time she noted some improvement in her depression symptoms but was still struggling with the loss of her mother and brother.  We increased her prozac from 20mg  to 40mg .   Notes that in between Thanksgiving and christmas she felt kind of down.  Didn't want to get out of bed. Now that is better but reports that she just feels sad about missing her mom and brother. The holidays and an anniversary death seem to have worsened this. She reports tolerating prozac 40mg  without side effects. Overall thinks she is doing ok though.     Review of Systems    see HPI  Past Medical History:  Diagnosis Date  . Anemia    hx of iron deficient anemia  . Depression   . Gastric polyp   . GERD (gastroesophageal reflux disease)   . Hemorrhoids   . Joint pain   . Osteopenia 05/04/2017  . Postmenopausal      Social History   Socioeconomic History  . Marital status: Married    Spouse name: Not on file  . Number of children: 2  . Years of education: Not on file  . Highest education level: Not on file  Occupational History  . 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 Financial resource strain: Not on file  . Food insecurity:    Worry: Not on file    Inability: Not on file  . Transportation needs:    Medical: Not on file    Non-medical: Not on file  Tobacco Use  . Smoking status: Former Smoker    Types: Cigarettes    Last attempt to quit: 07/04/1985    Years since quitting: 33.0  . Smokeless tobacco: Never Used  Substance and Sexual Activity  . Alcohol use: Yes    Alcohol/week: 0.0 standard drinks    Comment: weekends  . Drug use: No  . Sexual activity: Not on file  Lifestyle  . Physical activity:    Days per week: Not on file      Minutes per session: Not on file  . Stress: Not on file  Relationships  . Social connections:    Talks on phone: Not on file    Gets together: Not on file    Attends religious service: Not on file    Active member of club or organization: Not on file    Attends meetings of clubs or organizations: Not on file    Relationship status: Not on file  . Intimate partner violence:    Fear of current or ex partner: Not on file    Emotionally abused: Not on file    Physically abused: Not on file    Forced sexual activity: Not on file  Other Topics Concern  . 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       Past Surgical History:  Procedure Laterality Date  . ENDOMETRIAL ABLATION  2007  Novasure  . TUBAL LIGATION  1983  . TUBAL LIGATION      Family History  Problem Relation Age of Onset  . Diabetes Mother   . Heart disease Mother   . Cancer Mother 31       breast cancer  . Stroke Father   . Diabetes Father   . Heart disease Father        CABG, AVR, A-Fib  . Hepatitis Brother   . Alcohol abuse Brother   . Raynaud syndrome Sister   . Healthy Daughter   . Depression Son   . Anxiety disorder Son   . Healthy Son     Allergies  Allergen Reactions  . Hydrocodone-Acetaminophen     "made her skin crawl"  . Hydrocodone-Acetaminophen Itching  . Omni-Pac     vomitting  . Voltaren [Diclofenac]     diarrhea    Current Outpatient Medications on File Prior to Visit  Medication Sig Dispense Refill  . atorvastatin (LIPITOR) 10 MG tablet Take 1 tablet (10 mg total) by mouth daily. 90 tablet 1  . cyclobenzaprine (FLEXERIL) 10 MG tablet Take 1 tablet (10 mg total) by mouth 2 (two) times daily as needed. 90 tablet 1  . diclofenac sodium (VOLTAREN) 1 % GEL  Apply 3 gm to 3 large joints up to 3 times a day.Dispense 3 tubes with 3 refills. 3 Tube 1  . ESTRACE VAGINAL 0.1 MG/GM vaginal cream Place 1 application vaginally at bedtime.  1  . estradiol (ESTRACE) 0.5 MG tablet Take 0.5 mg by mouth daily.      Marland Kitchen FLUoxetine (PROZAC) 40 MG capsule TAKE 1 CAPSULE BY MOUTH DAILY 90 capsule 2  . lidocaine (LIDODERM) 5 % Place 1 patch onto the skin daily. Remove & Discard patch within 12 hours or as directed by MD 30 patch 0  . medroxyPROGESTERone (PROVERA) 2.5 MG tablet Take 1 tablet by mouth daily.    . Multiple Vitamins-Minerals (MULTIVITAMIN WITH MINERALS) tablet 1 tablet.      Marland Kitchen omeprazole (PRILOSEC) 40 MG capsule Take 1 capsule (40 mg total) by mouth daily. 90 capsule 1  . predniSONE (DELTASONE) 5 MG tablet Take 4 tablets by mouth x2 days, 3 tablets x2 days, 2 tablets x2 days, 1 tablet x2 days. 20 tablet 0  . zolpidem (AMBIEN) 10 MG tablet TAKE ONE-HALF TO ONE TABLET BY MOUTH AT BEDTIME AS NEEDED FOR SLEEP 15 tablet 0   No current facility-administered medications on file prior to visit.     BP 122/75 (BP Location: Left Arm, Patient Position: Sitting, Cuff Size: Small)   Pulse 62   Temp 97.8 F (36.6 C) (Oral)   Resp 16   Ht 5\' 4"  (1.626 m)   Wt 168 lb (76.2 kg)   SpO2 100%   BMI 28.84 kg/m    Objective:   Physical Exam Constitutional:      Appearance: She is well-developed.  Neck:     Thyroid: No thyromegaly.  Neurological:     Mental Status: She is alert and oriented to person, place, and time.  Psychiatric:        Behavior: Behavior normal.        Thought Content: Thought content normal.        Judgment: Judgment normal.           Assessment & Plan:  Depression- she is still having some sadness/grieving.  However overall depression control is OK at this point. She is comfortable continuing current dose of prozac  and will contact us if she notes worsening depression.  A total of 15  minutes were spent face-to-face with the  patient during this encounter and over half of that time was spent on counseling and coordination of care. The patient was counseled on depression/grief.

## 2018-11-19 ENCOUNTER — Encounter: Payer: Self-pay | Admitting: Family

## 2018-11-19 ENCOUNTER — Other Ambulatory Visit: Payer: Self-pay | Admitting: Family

## 2018-11-19 MED ORDER — ZOLPIDEM TARTRATE 10 MG PO TABS: ORAL_TABLET | ORAL | 0 refills | Status: DC

## 2018-12-10 ENCOUNTER — Other Ambulatory Visit: Payer: Self-pay | Admitting: Family

## 2019-01-22 ENCOUNTER — Other Ambulatory Visit: Payer: Self-pay | Admitting: Family

## 2019-02-04 ENCOUNTER — Encounter: Payer: Self-pay | Admitting: Family

## 2019-02-06 ENCOUNTER — Encounter: Payer: Self-pay | Admitting: *Deleted

## 2019-02-06 ENCOUNTER — Telehealth: Payer: Self-pay | Admitting: *Deleted

## 2019-02-06 ENCOUNTER — Other Ambulatory Visit: Payer: Self-pay | Admitting: Rheumatology

## 2019-02-06 MED ORDER — METHYLPREDNISOLONE 4 MG PO TBPK: ORAL_TABLET | ORAL | 0 refills | Status: DC

## 2019-02-06 NOTE — Telephone Encounter (Signed)
Left voicemail on cell to check mychart messages.

## 2019-02-06 NOTE — Telephone Encounter (Signed)
Patient sent a mychart on Monday and has not heard back.  Can you please look at [employer].  I tried to see if she would like to schedule an appointment to be seen, but she wanted you to review the message first.    I sent the mychart message to you on 02/04/19.

## 2019-03-12 ENCOUNTER — Encounter: Payer: Self-pay | Admitting: Family

## 2019-03-13 ENCOUNTER — Encounter: Payer: Self-pay | Admitting: Family

## 2019-03-13 ENCOUNTER — Ambulatory Visit (INDEPENDENT_AMBULATORY_CARE_PROVIDER_SITE_OTHER): Payer: BC Managed Care – PPO | Admitting: Family

## 2019-03-13 ENCOUNTER — Other Ambulatory Visit: Payer: Self-pay

## 2019-03-13 DIAGNOSIS — R002 Palpitations: Secondary | ICD-10-CM | POA: Diagnosis not present

## 2019-03-13 DIAGNOSIS — R1013 Epigastric pain: Secondary | ICD-10-CM

## 2019-03-13 LAB — CBC WITH DIFFERENTIAL/PLATELET
Basophils Absolute: 0 10*3/uL (ref 0.0–0.1)
Basophils Relative: 0.4 % (ref 0.0–3.0)
Eosinophils Absolute: 0.1 10*3/uL (ref 0.0–0.7)
Eosinophils Relative: 1.9 % (ref 0.0–5.0)
HCT: 36.3 % (ref 36.0–46.0)
Hemoglobin: 12.2 g/dL (ref 12.0–15.0)
Lymphocytes Relative: 31 % (ref 12.0–46.0)
Lymphs Abs: 2 10*3/uL (ref 0.7–4.0)
MCHC: 33.7 g/dL (ref 30.0–36.0)
MCV: 94.9 fl (ref 78.0–100.0)
Monocytes Absolute: 0.4 10*3/uL (ref 0.1–1.0)
Monocytes Relative: 6.3 % (ref 3.0–12.0)
Neutro Abs: 3.9 10*3/uL (ref 1.4–7.7)
Neutrophils Relative %: 60.4 % (ref 43.0–77.0)
Platelets: 266 10*3/uL (ref 150.0–400.0)
RBC: 3.82 Mil/uL — ABNORMAL LOW (ref 3.87–5.11)
RDW: 13 % (ref 11.5–15.5)
WBC: 6.4 10*3/uL (ref 4.0–10.5)

## 2019-03-13 LAB — COMPREHENSIVE METABOLIC PANEL
ALT: 24 U/L (ref 0–35)
AST: 18 U/L (ref 0–37)
Albumin: 4.3 g/dL (ref 3.5–5.2)
Alkaline Phosphatase: 66 U/L (ref 39–117)
BUN: 15 mg/dL (ref 6–23)
CO2: 28 mEq/L (ref 19–32)
Calcium: 9.4 mg/dL (ref 8.4–10.5)
Chloride: 103 mEq/L (ref 96–112)
Creatinine, Ser: 0.59 mg/dL (ref 0.40–1.20)
GFR: 104.64 mL/min (ref >60.00)
Glucose, Bld: 91 mg/dL (ref 70–99)
Potassium: 4 mEq/L (ref 3.5–5.1)
Sodium: 138 mEq/L (ref 135–145)
Total Bilirubin: 0.8 mg/dL (ref 0.2–1.2)
Total Protein: 6.7 g/dL (ref 6.0–8.3)

## 2019-03-13 LAB — TSH: TSH: 1.89 u[IU]/mL (ref 0.35–4.50)

## 2019-03-13 LAB — LIPASE: Lipase: 12 U/L (ref 11.0–59.0)

## 2019-03-13 NOTE — Patient Instructions (Signed)
Please complete lab work prior to leaving. Call if symptoms are not improved in 1 week or if symptoms worsen. Go to ER if severe abdominal pain or if you are unable to keep down food/liquids.

## 2019-03-13 NOTE — Progress Notes (Signed)
Virtual Visit via Video Note  I connected with Haley Fuentes on 03/13/19 at  8:20 AM EDT by a video enabled telemedicine application and verified that I am speaking with the correct person using two identifiers.  (patient  Location: Patient: work Provider: work   I discussed the limitations of evaluation and management by telemedicine and the availability of in person appointments. The patient expressed understanding and agreed to proceed.  History of Present Illness:  Patient is a 58 yr old female who presents today with chief complaint of epigastric pain. Reports good compliance with PPI.  States that symptoms began on saturday AM 9/5. States that she woke up with abdominal discomfort "like I did 500 sit ups." Took some tylenol, went for a hike on Sunday. Reports that Monday night she started getting some strange feelings: "Like a fluttering in the epigastric region" and up into her esophagus.    She denies nausea or vomiting today.  I asked her about her alcohol consumption and she states that she "drinks too much but I am not an alcoholic."  Does enjoy wine in the evenings.  Past Medical History:  Diagnosis Date  . Anemia    hx of iron deficient anemia  . Depression   . Gastric polyp   . GERD (gastroesophageal reflux disease)   . Hemorrhoids   . Joint pain   . Osteopenia 05/04/2017  . Postmenopausal      Social History   Socioeconomic History  . Marital status: Married    Spouse name: Not on file  . Number of children: 2  . Years of education: Not on file  . Highest education level: Not on file  Occupational History  . 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 Financial resource strain: Not on file  . Food insecurity    Worry: Not on file    Inability: Not on file  . Transportation needs    Medical: Not on file    Non-medical: Not on file  Tobacco Use  . Smoking status: Former Smoker    Types: Cigarettes    Quit date: 07/04/1985     Years since quitting: 33.7  . Smokeless tobacco: Never Used  Substance and Sexual Activity  . Alcohol use: Yes    Alcohol/week: 0.0 standard drinks    Comment: weekends  . Drug use: No  . Sexual activity: Not on file  Lifestyle  . Physical activity    Days per week: Not on file    Minutes per session: Not on file  . Stress: Not on file  Relationships  . Social Herbalist on phone: Not on file    Gets together: Not on file    Attends religious service: Not on file    Active member of club or organization: Not on file    Attends meetings of clubs or organizations: Not on file    Relationship status: Not on file  . Intimate partner violence    Fear of current or ex partner: Not on file    Emotionally abused: Not on file    Physically abused: Not on file    Forced sexual activity: Not on file  Other Topics Concern  . 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       Past Surgical History:  Procedure Laterality Date  . ENDOMETRIAL ABLATION  2007   Novasure  . TUBAL LIGATION  1983  . TUBAL LIGATION      Family History  Problem Relation Age of Onset  . Diabetes Mother   . Heart disease Mother   . Cancer Mother 57       breast cancer  . Stroke Father   . Diabetes Father   . Heart disease Father        CABG, AVR, A-Fib  . Hepatitis Brother   . Alcohol abuse Brother   . Raynaud syndrome Sister   . Healthy Daughter   . Depression Son   . Anxiety disorder Son   . Healthy Son     Allergies  Allergen Reactions  . Hydrocodone-Acetaminophen     "made her skin crawl"  . Hydrocodone-Acetaminophen Itching  . Omni-Pac     vomitting  . Voltaren [Diclofenac]     diarrhea    Current Outpatient Medications on File Prior to Visit   Medication Sig Dispense Refill  . atorvastatin (LIPITOR) 10 MG tablet Take 1 tablet (10 mg total) by mouth daily. 90 tablet 1  . cyclobenzaprine (FLEXERIL) 10 MG tablet TAKE ONE TABLET BY MOUTH TWICE A DAY AS NEEDED 90 tablet 0  . diclofenac sodium (VOLTAREN) 1 % GEL Apply 3 gm to 3 large joints up to 3 times a day.Dispense 3 tubes with 3 refills. 3 Tube 1  . ESTRACE VAGINAL 0.1 MG/GM vaginal cream Place 1 application vaginally at bedtime.  1  . estradiol (ESTRACE) 0.5 MG tablet Take 0.5 mg by mouth daily.      Marland Kitchen FLUoxetine (PROZAC) 40 MG capsule TAKE 1 CAPSULE BY MOUTH DAILY 90 capsule 2  . lidocaine (LIDODERM) 5 % Place 1 patch onto the skin daily. Remove & Discard patch within 12 hours or as directed by MD 30 patch 0  . medroxyPROGESTERone (PROVERA) 2.5 MG tablet Take 1 tablet by mouth daily.    . methylPREDNISolone (MEDROL DOSEPAK) 4 MG TBPK tablet Please take per package instructions 21 tablet 0  . Multiple Vitamins-Minerals (MULTIVITAMIN WITH MINERALS) tablet 1 tablet.      Marland Kitchen omeprazole (PRILOSEC) 40 MG capsule TAKE ONE CAPSULE BY MOUTH DAILY  -SCHEDULE APPOINTMENT  FOR FURTHER REFILLS- 90 capsule 0  . predniSONE (DELTASONE) 5 MG tablet Take 4 tablets by mouth x2 days, 3 tablets x2 days, 2 tablets x2 days, 1 tablet x2 days. 20 tablet 0  . zolpidem (AMBIEN) 10 MG tablet TAKE ONE-HALF TO ONE TABLET BY MOUTH AT BEDTIME AS NEEDED FOR SLEEP 15 tablet 0   No current facility-administered medications on file prior to visit.     There were no vitals taken for this visit.   Observations/Objective:   Gen: Awake, alert, no acute distress Resp: Breathing is even and non-labored, breath sounds are CTA bilaterally CV: s1/s2, RRR Psych: calm/pleasant demeanor Neuro: Alert and Oriented x 3, + facial symmetry, speech is clear. Abd: + epigastric tenderness to palpation without guarding.  Abdomen is soft.  + hypoactive bowel sounds. No RUQ tenderness  Assessment and Plan:  Epigastric pain-  differential includes PUD, pancreatitis, less likely cholecystitis. Will obtain cmet, cbc, lipase.  If symptoms persist and lab work WNL will plan abdominal US and GI referral. Continue PPI.   Palpitations- EKG tracing is personally reviewed.  EKG notes NSR.  No acute changes.  Obtain electrolytes, cbc, tsh.    Follow Up Instructions:    I discussed the assessment and treatment plan with the patient. The patient was provided an opportunity to ask questions and all were answered. The patient agreed with the plan and demonstrated an understanding of the instructions.   The patient was advised to call back or seek an in-person evaluation if the symptoms worsen or if the condition fails to improve as anticipated.  Nance Pear, NP

## 2019-03-15 ENCOUNTER — Other Ambulatory Visit: Payer: Self-pay | Admitting: Family

## 2019-03-18 ENCOUNTER — Encounter: Payer: Self-pay | Admitting: Family

## 2019-03-19 ENCOUNTER — Encounter (HOSPITAL_BASED_OUTPATIENT_CLINIC_OR_DEPARTMENT_OTHER): Payer: Self-pay

## 2019-03-19 ENCOUNTER — Ambulatory Visit (HOSPITAL_BASED_OUTPATIENT_CLINIC_OR_DEPARTMENT_OTHER): Payer: BC Managed Care – PPO

## 2019-03-19 MED ORDER — PANTOPRAZOLE SODIUM 40 MG PO TBEC: 40.0000 mg | DELAYED_RELEASE_TABLET | Freq: Every day | ORAL | 3 refills | Status: DC

## 2019-03-25 ENCOUNTER — Encounter: Payer: Self-pay | Admitting: Family

## 2019-03-25 DIAGNOSIS — R1013 Epigastric pain: Secondary | ICD-10-CM

## 2019-03-25 NOTE — Telephone Encounter (Signed)
Haley Fuentes, can you please give this information to Haley Fuentes so she can schedule the [place]?

## 2019-03-26 ENCOUNTER — Encounter: Payer: Self-pay | Admitting: Family

## 2019-03-27 DIAGNOSIS — R1013 Epigastric pain: Secondary | ICD-10-CM | POA: Diagnosis not present

## 2019-03-27 NOTE — Telephone Encounter (Signed)
Can you please call Haley Fuentes after she completes to request copy of the report and make sure that one of the other providers looks at the results in my absence? tks

## 2019-03-28 NOTE — Telephone Encounter (Signed)
Called [facility] but after a long wait, I had to leave a message. Will try again tomorrow.

## 2019-03-29 NOTE — Telephone Encounter (Signed)
Haley Fuentes, Orders have been updated

## 2019-03-29 NOTE — Telephone Encounter (Signed)
Per Dawn at [facility], [place] was done 03-27-19  But results are not back yet. We we need to call in a few days to see if available.

## 2019-03-29 NOTE — Telephone Encounter (Signed)
Per [facility] [place] was done 03-27-19 but results are not back yet.

## 2019-03-29 NOTE — Telephone Encounter (Signed)
Ultra sound was done, per [facility] results are not back yet

## 2019-04-01 NOTE — Telephone Encounter (Signed)
Patient advised I have not been able to get in contact with [facility] medical or [facility] radiology

## 2019-04-01 NOTE — Telephone Encounter (Signed)
Called [facility] at number provided by patient [phone],) they said report is not back yet.

## 2019-04-02 ENCOUNTER — Encounter: Payer: Self-pay | Admitting: Family

## 2019-04-02 DIAGNOSIS — R1013 Epigastric pain: Secondary | ICD-10-CM

## 2019-04-02 DIAGNOSIS — N2889 Other specified disorders of kidney and ureter: Secondary | ICD-10-CM

## 2019-04-03 ENCOUNTER — Encounter: Payer: Self-pay | Admitting: Physician Assistant

## 2019-04-03 NOTE — Addendum Note (Signed)
Addended by: [name] on: 04/03/2019 08:26 AM   Modules accepted: Orders

## 2019-04-11 ENCOUNTER — Encounter: Payer: Self-pay | Admitting: Physician Assistant

## 2019-04-11 ENCOUNTER — Ambulatory Visit: Payer: BC Managed Care – PPO | Admitting: Physician Assistant

## 2019-04-11 VITALS — BP 110/78 | HR 72 | Temp 97.8°F | Ht 63.0 in | Wt 165.5 lb

## 2019-04-11 DIAGNOSIS — Z1211 Encounter for screening for malignant neoplasm of colon: Secondary | ICD-10-CM | POA: Diagnosis not present

## 2019-04-11 DIAGNOSIS — R1310 Dysphagia, unspecified: Secondary | ICD-10-CM

## 2019-04-11 DIAGNOSIS — R1013 Epigastric pain: Secondary | ICD-10-CM | POA: Diagnosis not present

## 2019-04-11 MED ORDER — PANTOPRAZOLE SODIUM 40 MG PO TBEC: 40.0000 mg | DELAYED_RELEASE_TABLET | Freq: Two times a day (BID) | ORAL | 11 refills | Status: DC

## 2019-04-11 MED ORDER — GOLYTELY 236 G PO SOLR: 4000.0000 mL | Freq: Once | ORAL | 0 refills | Status: AC

## 2019-04-11 NOTE — Progress Notes (Signed)
Chief Complaint: Epigastric pain  HPI:    Haley Fuentes is a 58 year old Caucasian female with a past medical history as listed below, known to Dr. Ardis Hughs, who was referred to me by [name], NP for a complaint of epigastric pain.      08/24/2007 colonoscopy for constipation and hematochezia.  No polyps.  Intermittent rectal bleeding thought likely hemorrhoidal.  Started on fiber.    03/30/2011 EGD for epigastric abdominal pain with moderate acute appearing gastritis.    03/13/2019 CBC, CMP, TSH and lipase normal.    03/13/2019 office visit with PCP.  Discussed epigastric pain.  Had good compliance with PPI.  Apparently symptoms started Saturday and 9/5 and woke with abdominal discomfort "like I did 100 sit ups", then started to get strange feelings "like a fluttering in the epigastric region" and up into her esophagus.  Apparently admitted that "I drink too much but I am not an alcoholic" at that office visit.    Today, the patient explains that the beginning of September on a Saturday she woke up and "felt like I got punched in the gut".  It hurt like this for days when she finally went to see her PCP as above.  They changed her Omeprazole 40 mg to Pantoprazole 40 mg but patient has had no change in symptoms over the past month.  Tells me she had labs above which were normal as well as an ultrasound which is normal (I cannot see these results in the computer).  Associated symptoms include eructations and uncontrollable belch which "my husband and I have named the pteradactyl".  Also feels like "my chest is caving in and tight".  Also describes that food and water seem to choke her recently.  Pain is rated as a 8-9/10 at its worst.    Also describes that her bowel movements have turned to looking like "little round balls", but she still goes every day and does not feel incomplete evacuation.    Denies fever, chills, weight loss or blood in her stool.  Past Medical History:  Diagnosis Date  .  Anemia    hx of iron deficient anemia  . Depression   . Gastric polyp   . GERD (gastroesophageal reflux disease)   . Hemorrhoids   . Joint pain   . Osteopenia 05/04/2017  . Postmenopausal     Past Surgical History:  Procedure Laterality Date  . ENDOMETRIAL ABLATION  2007   Novasure  . TUBAL LIGATION  1983  . TUBAL LIGATION      Current Outpatient Medications  Medication Sig Dispense Refill  . atorvastatin (LIPITOR) 10 MG tablet TAKE 1 TABLET BY MOUTH DAILY 90 tablet 0  . cyclobenzaprine (FLEXERIL) 10 MG tablet TAKE ONE TABLET BY MOUTH TWICE A DAY AS NEEDED 90 tablet 0  . diclofenac sodium (VOLTAREN) 1 % GEL Apply 3 gm to 3 large joints up to 3 times a day.Dispense 3 tubes with 3 refills. 3 Tube 1  . ESTRACE VAGINAL 0.1 MG/GM vaginal cream Place 1 application vaginally at bedtime.  1  . estradiol (ESTRACE) 0.5 MG tablet Take 0.5 mg by mouth daily.      Marland Kitchen FLUoxetine (PROZAC) 40 MG capsule TAKE 1 CAPSULE BY MOUTH DAILY 90 capsule 2  . lidocaine (LIDODERM) 5 % Place 1 patch onto the skin daily. Remove & Discard patch within 12 hours or as directed by MD 30 patch 0  . medroxyPROGESTERone (PROVERA) 2.5 MG tablet Take 1 tablet by mouth daily.    Marland Kitchen  methylPREDNISolone (MEDROL DOSEPAK) 4 MG TBPK tablet Please take per package instructions 21 tablet 0  . Multiple Vitamins-Minerals (MULTIVITAMIN WITH MINERALS) tablet 1 tablet.      . pantoprazole (PROTONIX) 40 MG tablet Take 1 tablet (40 mg total) by mouth daily. 30 tablet 3  . predniSONE (DELTASONE) 5 MG tablet Take 4 tablets by mouth x2 days, 3 tablets x2 days, 2 tablets x2 days, 1 tablet x2 days. 20 tablet 0  . zolpidem (AMBIEN) 10 MG tablet TAKE ONE-HALF TO ONE TABLET BY MOUTH AT BEDTIME AS NEEDED FOR SLEEP 15 tablet 0   No current facility-administered medications for this visit.     Allergies as of 04/11/2019 - Review Complete 07/20/2018  Allergen Reaction Noted  . Hydrocodone-acetaminophen    . Hydrocodone-acetaminophen Itching    . Omni-pac  02/23/2011  . Voltaren [diclofenac]  09/11/2014    Family History  Problem Relation Age of Onset  . Diabetes Mother   . Heart disease Mother   . Cancer Mother 95       breast cancer  . Stroke Father   . Diabetes Father   . Heart disease Father        CABG, AVR, A-Fib  . Hepatitis Brother   . Alcohol abuse Brother   . Raynaud syndrome Sister   . Healthy Daughter   . Depression Son   . Anxiety disorder Son   . Healthy Son     Social History   Socioeconomic History  . Marital status: Married    Spouse name: Not on file  . Number of children: 2  . Years of education: Not on file  . Highest education level: Not on file  Occupational History  . 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 Financial resource strain: Not on file  . Food insecurity    Worry: Not on file    Inability: Not on file  . Transportation needs    Medical: Not on file    Non-medical: Not on file  Tobacco Use  . Smoking status: Former Smoker    Types: Cigarettes    Quit date: 07/04/1985    Years since quitting: 33.7  . Smokeless tobacco: Never Used  Substance and Sexual Activity  . Alcohol use: Yes    Alcohol/week: 0.0 standard drinks    Comment: weekends  . Drug use: No  . Sexual activity: Not on file  Lifestyle  . Physical activity    Days per week: Not on file    Minutes per session: Not on file  . Stress: Not on file  Relationships  . Social Herbalist on phone: Not on file    Gets together: Not on file    Attends religious service: Not on file    Active member of club or organization: Not on file    Attends meetings of clubs or organizations: Not on file    Relationship status: Not on file  . Intimate partner violence    Fear of current or ex partner: Not on file    Emotionally abused: Not on file    Physically abused: Not on file    Forced sexual activity: Not on file  Other Topics Concern  . 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       Review of Systems:    Constitutional: No weight loss, fever or chills Skin: No rash  Cardiovascular: No chest pain Respiratory: No SOB  Gastrointestinal: See HPI and otherwise negative Genitourinary: No dysuria  Neurological: No headache Musculoskeletal: No new muscle or joint pain Hematologic: No bleeding  Psychiatric: No history of depression or anxiety   Physical Exam:  Vital signs: BP 110/78 (BP Location: Left Arm, Patient Position: Sitting, Cuff Size: Normal)   Pulse 72   Temp 97.8 F (36.6 C)   Ht 5\' 3"  (1.6 m) Comment: height measured without shoes  Wt 165 lb 8 oz (75.1 kg)   BMI 29.32 kg/m   Constitutional:   Pleasant Caucasian female appears to be in NAD, Well developed, Well nourished, alert and cooperative Head:  Normocephalic and atraumatic. Eyes:   PEERL, EOMI. No icterus. Conjunctiva pink. Ears:  Normal auditory acuity. Neck:  Supple Throat: Oral cavity and pharynx without inflammation, swelling or lesion.  Respiratory: Respirations even and unlabored. Lungs clear to auscultation bilaterally.   No wheezes, crackles, or rhonchi.  Cardiovascular: Normal S1, S2. No MRG. Regular rate and rhythm. No peripheral edema, cyanosis or pallor.  Gastrointestinal:  Soft, nondistended, marked epigastric ttp with involuntary guarding. No rebound or guarding. Normal bowel sounds. No appreciable masses or hepatomegaly. Rectal:  Not performed.  Msk:  Symmetrical without gross deformities. Without edema, no deformity or joint abnormality.  Neurologic:  Alert and  oriented x4;  grossly normal neurologically.  Skin:   Dry and intact without significant lesions or rashes. Psychiatric:  Demonstrates good judgement and reason without  abnormal affect or behaviors.  RELEVANT LABS AND IMAGING: CBC    Component Value Date/Time   WBC 6.4 03/13/2019 0916   RBC 3.82 (L) 03/13/2019 0916   HGB 12.2 03/13/2019 0916   HCT 36.3 03/13/2019 0916   PLT 266.0 03/13/2019 0916   MCV 94.9 03/13/2019 0916   MCH 30.7 10/17/2013 1032   MCHC 33.7 03/13/2019 0916   RDW 13.0 03/13/2019 0916   LYMPHSABS 2.0 03/13/2019 0916   MONOABS 0.4 03/13/2019 0916   EOSABS 0.1 03/13/2019 0916   BASOSABS 0.0 03/13/2019 0916    CMP     Component Value Date/Time   NA 138 03/13/2019 0916   K 4.0 03/13/2019 0916   CL 103 03/13/2019 0916   CO2 28 03/13/2019 0916   GLUCOSE 91 03/13/2019 0916   BUN 15 03/13/2019 0916   CREATININE 0.59 03/13/2019 0916   CREATININE 0.66 10/17/2013 1032   CALCIUM 9.4 03/13/2019 0916   PROT 6.7 03/13/2019 0916   ALBUMIN 4.3 03/13/2019 0916   AST 18 03/13/2019 0916   ALT 24 03/13/2019 0916   ALKPHOS 66 03/13/2019 0916   BILITOT 0.8 03/13/2019 0916   GFRNONAA >60 03/25/2011 1120   GFRNONAA >60 03/16/2011 1521   GFRAA >60 03/25/2011 1120   GFRAA >60 03/16/2011 1521    Assessment: 1.  Epigastric pain: For the past month, no change with change from Omeprazole to Pantoprazole over the past month, history of gastritis back in 2012; consider gastritis+/-H. Pylori+/-PUD 2.  Dysphagia: Feels like she gets choked on water and food sometimes; consider esophageal stricture versus ring versus other 3.  Screening for colorectal cancer: Last colonoscopy in 2009, due for repeat  Plan: 1.  Scheduled patient for diagnostic EGD with possible dilation in the [facility].  Also scheduled patient for a screening colonoscopy in the  [facility].  These were scheduled with Dr. Ardis Hughs.  Did discuss risks, benefits, limitations and alternatives and the patient agrees to proceed. 2.  Increased Pantoprazole to 40 mg twice daily, 30-60 minutes before breakfast and dinner.  #60 with 3 refills. 3.  Reviewed antireflux diet and lifestyle modifications. 4.   Recommend the patient concentrate on drinking enough water at least 6-8 eight ounce glasses of water per day and increasing fiber in her diet to maintain regular solid stools. 4.  Patient to follow in clinic per recommendations after time of procedures.  Ellouise Newer, PA-C [facility] 04/11/2019, 1:35 PM  Cc: [name], NP

## 2019-04-11 NOTE — Patient Instructions (Signed)
Increase your pantoprazole to 40 mg twice daily 30-60 minutes before breakfast and dinner. A new prescription has been sent to your pharmacy.   You have been scheduled for an endoscopy and colonoscopy. Please follow the written instructions given to you at your visit today. Please pick up your prep supplies at the pharmacy within the next 1-3 days. If you use inhalers (even only as needed), please bring them with you on the day of your procedure. Your physician has requested that you go to www.startemmi.com and enter the access code given to you at your visit today. This web site gives a general overview about your procedure. However, you should still follow specific instructions given to you by our office regarding your preparation for the procedure.

## 2019-04-12 NOTE — Progress Notes (Signed)
I agree with the above note, plan 

## 2019-05-02 ENCOUNTER — Other Ambulatory Visit: Payer: Self-pay | Admitting: Family

## 2019-05-07 ENCOUNTER — Encounter: Payer: Self-pay | Admitting: Gastroenterology

## 2019-05-10 ENCOUNTER — Ambulatory Visit (AMBULATORY_SURGERY_CENTER): Payer: BC Managed Care – PPO | Admitting: Gastroenterology

## 2019-05-10 ENCOUNTER — Other Ambulatory Visit: Payer: Self-pay

## 2019-05-10 ENCOUNTER — Encounter: Payer: Self-pay | Admitting: Gastroenterology

## 2019-05-10 VITALS — BP 114/65 | HR 57 | Temp 97.7°F | Resp 17 | Ht 63.0 in | Wt 165.0 lb

## 2019-05-10 DIAGNOSIS — K2970 Gastritis, unspecified, without bleeding: Secondary | ICD-10-CM

## 2019-05-10 DIAGNOSIS — D122 Benign neoplasm of ascending colon: Secondary | ICD-10-CM | POA: Diagnosis not present

## 2019-05-10 DIAGNOSIS — R1310 Dysphagia, unspecified: Secondary | ICD-10-CM

## 2019-05-10 DIAGNOSIS — K2950 Unspecified chronic gastritis without bleeding: Secondary | ICD-10-CM

## 2019-05-10 DIAGNOSIS — Z1211 Encounter for screening for malignant neoplasm of colon: Secondary | ICD-10-CM

## 2019-05-10 MED ORDER — SODIUM CHLORIDE 0.9 % IV SOLN: 500.0000 mL | INTRAVENOUS | Status: DC

## 2019-05-10 NOTE — Op Note (Signed)
[place] Patient Name: Haley Fuentes Procedure Date: 05/10/2019 2:37 PM MRN: [id number] Endoscopist: Milus Banister , MD Age: 58 Referring MD:  Date of Birth: 1960/08/28 Gender: Female Account #: [id number] Procedure:                Upper GI endoscopy Indications:              Dyspepsia, epigastric discomfort. Medicines:                Monitored Anesthesia Care Procedure:                Pre-Anesthesia Assessment:                           - Prior to the procedure, a History and Physical                            was performed, and patient medications and                            allergies were reviewed. The patient's tolerance of                            previous anesthesia was also reviewed. The risks                            and benefits of the procedure and the sedation                            options and risks were discussed with the patient.                            All questions were answered, and informed consent                            was obtained. Prior Anticoagulants: The patient has                            taken no previous anticoagulant or antiplatelet                            agents. ASA Grade Assessment: II - A patient with                            mild systemic disease. After reviewing the risks                            and benefits, the patient was deemed in                            satisfactory condition to undergo the procedure.                           After obtaining informed consent, the endoscope was  passed under direct vision. Throughout the                            procedure, the patient's blood pressure, pulse, and                            oxygen saturations were monitored continuously. The                            Endoscope was introduced through the mouth, and                            advanced to the second part of duodenum. The upper                            GI endoscopy was  accomplished without difficulty.                            The patient tolerated the procedure well. Scope In: Scope Out: Findings:                 Minimal inflammation characterized by erythema was                            found in the gastric antrum. Biopsies were taken                            with a cold forceps for histology.                           The exam was otherwise without abnormality. Complications:            No immediate complications. Estimated blood loss:                            None. Estimated Blood Loss:     Estimated blood loss: none. Impression:               - Mild gastritis. Biopsied.                           - The examination was otherwise normal. Recommendation:           - Await pathology results. If the biopsies do NOT                            show H. pylori, you will need further testing                            (likely CT scan).                           - Patient has a contact number available for                            emergencies. The signs and symptoms of potential  delayed complications were discussed with the                            patient. Return to normal activities tomorrow.                            Written discharge instructions were provided to the                            patient.                           - Resume previous diet.                           - Continue present medications. Milus Banister, MD 05/10/2019 3:32:14 PM This report has been signed electronically.

## 2019-05-10 NOTE — Progress Notes (Signed)
PT taken to PACU. Monitors in place. VSS. Report given to RN. 

## 2019-05-10 NOTE — Progress Notes (Signed)
Called to room to assist during endoscopic procedure.  Patient ID and intended procedure confirmed with present staff. Received instructions for my participation in the procedure from the performing physician.  

## 2019-05-10 NOTE — Patient Instructions (Signed)
Discharge instructions given. Handout on polyps and Gasttritis. Resume previous medications. YOU HAD AN ENDOSCOPIC PROCEDURE TODAY AT [facility]:   Refer to the procedure report that was given to you for any specific questions about what was found during the examination.  If the procedure report does not answer your questions, please call your gastroenterologist to clarify.  If you requested that your care partner not be given the details of your procedure findings, then the procedure report has been included in a sealed envelope for you to review at your convenience later.  YOU SHOULD EXPECT: Some feelings of bloating in the abdomen. Passage of more gas than usual.  Walking can help get rid of the air that was put into your GI tract during the procedure and reduce the bloating. If you had a lower endoscopy (such as a colonoscopy or flexible sigmoidoscopy) you may notice spotting of blood in your stool or on the toilet paper. If you underwent a bowel prep for your procedure, you may not have a normal bowel movement for a few days.  Please Note:  You might notice some irritation and congestion in your nose or some drainage.  This is from the oxygen used during your procedure.  There is no need for concern and it should clear up in a day or so.  SYMPTOMS TO REPORT IMMEDIATELY:   Following lower endoscopy (colonoscopy or flexible sigmoidoscopy):  Excessive amounts of blood in the stool  Significant tenderness or worsening of abdominal pains  Swelling of the abdomen that is new, acute  Fever of 100F or higher   Following upper endoscopy (EGD)  Vomiting of blood or coffee ground material  New chest pain or pain under the shoulder blades  Painful or persistently difficult swallowing  New shortness of breath  Fever of 100F or higher  Black, tarry-looking stools  For urgent or emergent issues, a gastroenterologist can be reached at any hour by calling [phone].   DIET:   We do recommend a small meal at first, but then you may proceed to your regular diet.  Drink plenty of fluids but you should avoid alcoholic beverages for 24 hours.  ACTIVITY:  You should plan to take it easy for the rest of today and you should NOT DRIVE or use heavy machinery until tomorrow (because of the sedation medicines used during the test).    FOLLOW UP: Our staff will call the number listed on your records 48-72 hours following your procedure to check on you and address any questions or concerns that you may have regarding the information given to you following your procedure. If we do not reach you, we will leave a message.  We will attempt to reach you two times.  During this call, we will ask if you have developed any symptoms of COVID 19. If you develop any symptoms (ie: fever, flu-like symptoms, shortness of breath, cough etc.) before then, please call [phone].  If you test positive for Covid 19 in the 2 weeks post procedure, please call and report this information to [place].    If any biopsies were taken you will be contacted by phone or by letter within the next 1-3 weeks.  Please call us at [phone] if you have not heard about the biopsies in 3 weeks.    SIGNATURES/CONFIDENTIALITY: You and/or your care partner have signed paperwork which will be entered into your electronic medical record.  These signatures attest to the fact that that the information  above on your After Visit Summary has been reviewed and is understood.  Full responsibility of the confidentiality of this discharge information lies with you and/or your care-partner. 

## 2019-05-10 NOTE — Op Note (Signed)
[place] Patient Name: Haley Fuentes Procedure Date: 05/10/2019 2:37 PM MRN: [id number] Endoscopist: Milus Banister , MD Age: 58 Referring MD:  Date of Birth: June 17, 1961 Gender: Female Account #: [id number] Procedure:                Colonoscopy Indications:              Screening for colorectal malignant neoplasm Medicines:                Monitored Anesthesia Care Procedure:                Pre-Anesthesia Assessment:                           - Prior to the procedure, a History and Physical                            was performed, and patient medications and                            allergies were reviewed. The patient's tolerance of                            previous anesthesia was also reviewed. The risks                            and benefits of the procedure and the sedation                            options and risks were discussed with the patient.                            All questions were answered, and informed consent                            was obtained. Prior Anticoagulants: The patient has                            taken no previous anticoagulant or antiplatelet                            agents. ASA Grade Assessment: II - A patient with                            mild systemic disease. After reviewing the risks                            and benefits, the patient was deemed in                            satisfactory condition to undergo the procedure.                           After obtaining informed consent, the colonoscope  was passed under direct vision. Throughout the                            procedure, the patient's blood pressure, pulse, and                            oxygen saturations were monitored continuously. The                            Colonoscope was introduced through the anus and                            advanced to the the cecum, identified by                            appendiceal orifice and  ileocecal valve. The                            colonoscopy was performed without difficulty. The                            patient tolerated the procedure well. The quality                            of the bowel preparation was good. The ileocecal                            valve, appendiceal orifice, and rectum were                            photographed. Scope In: 3:01:26 PM Scope Out: 3:18:30 PM Scope Withdrawal Time: 0 hours 12 minutes 12 seconds  Total Procedure Duration: 0 hours 17 minutes 4 seconds  Findings:                 A 2 mm polyp was found in the ascending colon. The                            polyp was sessile. The polyp was removed with a                            cold snare. Resection and retrieval were complete.                           The exam was otherwise without abnormality on                            direct and retroflexion views. Complications:            No immediate complications. Estimated blood loss:                            None. Estimated Blood Loss:     Estimated blood loss: none. Impression:               -  One 2 mm polyp in the ascending colon, removed                            with a cold snare. Resected and retrieved.                           - The examination was otherwise normal on direct                            and retroflexion views. Recommendation:           - EGD now.                           - Await pathology results. Milus Banister, MD 05/10/2019 3:20:43 PM This report has been signed electronically.

## 2019-05-14 ENCOUNTER — Telehealth: Payer: Self-pay

## 2019-05-14 NOTE — Telephone Encounter (Signed)
Covid-19 screening questions   Do you now or have you had a fever in the last 14 days? No.  Do you have any respiratory symptoms of shortness of breath or cough now or in the last 14 days? No. Do you have any family members or close contacts with diagnosed or suspected Covid-19 in the past 14 days? No.  Have you been tested for Covid-19 and found to be positive? No.       Follow up Call-  Call back number 05/10/2019  Post procedure Call Back phone  # [phone]  Permission to leave phone message Yes  Some recent data might be hidden     Patient questions:  Do you have a fever, pain , or abdominal swelling? No. Pain Score  0 *  Have you tolerated food without any problems? Yes.    Have you been able to return to your normal activities? Yes.    Do you have any questions about your discharge instructions: Diet   No. Medications  No. Follow up visit  No.  Do you have questions or concerns about your Care? No.  Actions: * If pain score is 4 or above: No action needed, pain <4.

## 2019-05-20 ENCOUNTER — Encounter: Payer: Self-pay | Admitting: Family

## 2019-05-20 ENCOUNTER — Other Ambulatory Visit: Payer: Self-pay

## 2019-05-20 DIAGNOSIS — R1013 Epigastric pain: Secondary | ICD-10-CM

## 2019-05-22 ENCOUNTER — Telehealth: Payer: Self-pay

## 2019-05-22 NOTE — Telephone Encounter (Signed)
Patient cancelled CT-abd/pelvis because of $2,000 cost to her. Said she will look at other cheaper facilities than the hospital

## 2019-05-23 ENCOUNTER — Other Ambulatory Visit: Payer: Self-pay

## 2019-05-23 ENCOUNTER — Telehealth: Payer: Self-pay

## 2019-05-23 DIAGNOSIS — R1013 Epigastric pain: Secondary | ICD-10-CM

## 2019-05-23 DIAGNOSIS — M25519 Pain in unspecified shoulder: Secondary | ICD-10-CM

## 2019-05-23 NOTE — Telephone Encounter (Signed)
Changed order in Epic to [facility] for her CT-abd/pelvis w/contrast, per patient request because of cost. Called them and they will call patient to schedule

## 2019-05-23 NOTE — Telephone Encounter (Signed)
Pt would like to proceed with referral to sports medicine and wanting to be seen within a week if possible.

## 2019-05-24 ENCOUNTER — Ambulatory Visit (HOSPITAL_COMMUNITY): Admission: RE | Admit: 2019-05-24 | Payer: BC Managed Care – PPO | Source: Ambulatory Visit

## 2019-05-24 ENCOUNTER — Other Ambulatory Visit: Payer: Self-pay

## 2019-05-24 ENCOUNTER — Ambulatory Visit: Payer: Self-pay

## 2019-05-24 ENCOUNTER — Ambulatory Visit: Payer: BC Managed Care – PPO | Admitting: Family Medicine

## 2019-05-24 ENCOUNTER — Encounter: Payer: Self-pay | Admitting: Family Medicine

## 2019-05-24 ENCOUNTER — Encounter: Payer: Self-pay | Admitting: Family

## 2019-05-24 ENCOUNTER — Encounter (HOSPITAL_COMMUNITY): Payer: Self-pay

## 2019-05-24 VITALS — BP 130/83 | HR 72 | Ht 63.0 in | Wt 162.0 lb

## 2019-05-24 DIAGNOSIS — M25512 Pain in left shoulder: Secondary | ICD-10-CM

## 2019-05-24 DIAGNOSIS — M7550 Bursitis of unspecified shoulder: Secondary | ICD-10-CM | POA: Insufficient documentation

## 2019-05-24 MED ORDER — PENNSAID 2 % EX SOLN: 1.0000 "application " | Freq: Two times a day (BID) | CUTANEOUS | 3 refills | Status: DC

## 2019-05-24 MED ORDER — PREDNISONE 5 MG PO TABS: ORAL_TABLET | ORAL | 0 refills | Status: DC

## 2019-05-24 NOTE — Progress Notes (Signed)
Haley Fuentes - 58 y.o. female MRN [id number]  Date of birth: October 23, 1960  SUBJECTIVE:  Including CC & ROS.  Chief Complaint  Patient presents with  . Shoulder Pain    left shoulder    Haley Fuentes is a 58 y.o. female that is presenting with acute left shoulder pain.  The pain is been ongoing for about 2 weeks.  The pain is worse with reaching out as well as at night.  She denies any specific inciting event.  No history of similar symptoms.  Has tried Tylenol with no improvement.  She sits for most of the day.  Denies any history of surgery.  The pain seems to encompass the shoulder joint as well as laterally.  She does have some radiation down to the elbow.  Symptoms are intermittent in nature.  Symptoms can be moderate in severity.  Can be sharp and throbbing.   Review of Systems  Constitutional: Negative for fever.  HENT: Negative for congestion.   Respiratory: Negative for choking.   Cardiovascular: Negative for chest pain.  Gastrointestinal: Negative for abdominal distention.  Musculoskeletal: Negative for gait problem.  Skin: Negative for color change.  Neurological: Negative for weakness.  Hematological: Negative for adenopathy.    HISTORY: Past Medical, Surgical, Social, and Family History Reviewed & Updated per EMR.   Pertinent Historical Findings include:  Past Medical History:  Diagnosis Date  . Anemia    hx of iron deficient anemia  . Arthritis   . Blood transfusion without reported diagnosis   . Depression   . Gastric polyp   . GERD (gastroesophageal reflux disease)   . Hemorrhoids   . HLD (hyperlipidemia)   . Joint pain   . Kidney lesion    right  . Osteopenia 05/04/2017  . Postmenopausal     Past Surgical History:  Procedure Laterality Date  . ENDOMETRIAL ABLATION  2007   Novasure  . TUBAL LIGATION  1983    Allergies  Allergen Reactions  . Hydrocodone-Acetaminophen     "made her skin crawl"  . Hydrocodone-Acetaminophen Itching  . Omni-Pac    vomitting  . Voltaren [Diclofenac]     diarrhea    Family History  Problem Relation Age of Onset  . Diabetes Mother   . Heart disease Mother   . Breast cancer Mother 54  . Stroke Father   . Diabetes Father   . Heart disease Father        CABG, AVR, A-Fib  . Hepatitis Brother   . Alcohol abuse Brother   . Raynaud syndrome Sister   . Healthy Daughter   . Depression Son   . Anxiety disorder Son   . Healthy Son      Social History   Socioeconomic History  . Marital status: Married    Spouse name: Not on file  . Number of children: 2  . Years of education: Not on file  . Highest education level: Not on file  Occupational History  . 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 Financial resource strain: Not on file  . Food insecurity    Worry: Not on file    Inability: Not on file  . Transportation needs    Medical: Not on file    Non-medical: Not on file  Tobacco Use  . Smoking status: Former Smoker    Types: Cigarettes    Quit date: 07/04/1985    Years since quitting: 33.9  . Smokeless tobacco: Never  Used  Substance and Sexual Activity  . Alcohol use: Yes    Alcohol/week: 0.0 standard drinks    Comment: 4-5 times a week  . Drug use: No  . Sexual activity: Not on file  Lifestyle  . Physical activity    Days per week: Not on file    Minutes per session: Not on file  . Stress: Not on file  Relationships  . Social Herbalist on phone: Not on file    Gets together: Not on file    Attends religious service: Not on file    Active member of club or organization: Not on file    Attends meetings of clubs or organizations: Not on file    Relationship status: Not on file  . Intimate partner violence    Fear of current or ex partner: Not on file    Emotionally abused: Not on file    Physically abused: Not on file    Forced sexual activity: Not on file  Other Topics Concern  . 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        PHYSICAL EXAM:  VS: BP 130/83   Pulse 72   Ht 5\' 3"  (1.6 m)   Wt 162 lb (73.5 kg)   BMI 28.70 kg/m  Physical Exam Gen: NAD, alert, cooperative with exam, well-appearing ENT: normal lips, normal nasal mucosa,  Eye: normal EOM, normal conjunctiva and lids CV:  no edema, +2 pedal pulses   Resp: no accessory muscle use, non-labored,  Skin: no rashes, no areas of induration  Neuro: normal tone, normal sensation to touch Psych:  normal insight, alert and oriented MSK:  Left shoulder: Normal active flexion abduction. Normal internal and external rotation. Normal strength resistance. Pain with external rotation and abduction. Pain with empty can testing. Pain with speeds test. Pain with O'Brien's test. Neurovascularly intact  Limited ultrasound: Left shoulder:  Biceps tendon appears normal.  There is an encircling effusion visualized with the biceps. Normal-appearing subscapularis. Supraspinatus appears to be normal but there appears to be an overlying bursitis seen on dynamic testing and lateral view. Posterior glenohumeral joint has good dynamic motion and mild effusion present  Summary: Appears there is a subacromial bursitis as well as a joint effusion  Ultrasound and interpretation by Clearance Coots, MD    ASSESSMENT & PLAN:   Acute pain of left shoulder There appears to be more than 1 problem with the shoulder.  There appears to be a joint effusion but has no lack of range of motion.  May be related to degenerative changes.  It also appears to have a subacromial bursitis which may be secondary to the joint effusion as she may be trying to accommodate and not causing pain.  Does have indigestion so try to limit the amount of  anti-inflammatories. -Prednisone. -Pennsaid. -Counseled on home exercise therapy and supportive care. -Depending on her clinical exam on return could consider a subacromial injection or glenohumeral injection.  Could consider imaging and physical therapy.

## 2019-05-24 NOTE — Patient Instructions (Signed)
Nice to meet you  Please try the exercises  Please try to alternate heat and ice  Please try the rub on medicine after the prednisone  Please send me a message in MyChart with any questions or updates.  Please see me back in 4 weeks.   --Dr. Raeford Razor

## 2019-05-24 NOTE — Assessment & Plan Note (Signed)
There appears to be more than 1 problem with the shoulder.  There appears to be a joint effusion but has no lack of range of motion.  May be related to degenerative changes.  It also appears to have a subacromial bursitis which may be secondary to the joint effusion as Haley Fuentes may be trying to accommodate and not causing pain.  Does have indigestion so try to limit the amount of anti-inflammatories. -Prednisone. -Pennsaid. -Counseled on home exercise therapy and supportive care. -Depending on her clinical exam on return could consider a subacromial injection or glenohumeral injection.  Could consider imaging and physical therapy.

## 2019-05-27 ENCOUNTER — Other Ambulatory Visit: Payer: Self-pay

## 2019-05-27 ENCOUNTER — Telehealth: Payer: Self-pay | Admitting: Family

## 2019-05-27 ENCOUNTER — Other Ambulatory Visit: Payer: Self-pay | Admitting: Family Medicine

## 2019-05-27 ENCOUNTER — Telehealth (INDEPENDENT_AMBULATORY_CARE_PROVIDER_SITE_OTHER): Payer: BC Managed Care – PPO | Admitting: Family

## 2019-05-27 DIAGNOSIS — M25512 Pain in left shoulder: Secondary | ICD-10-CM

## 2019-05-27 DIAGNOSIS — F329 Major depressive disorder, single episode, unspecified: Secondary | ICD-10-CM

## 2019-05-27 DIAGNOSIS — F32A Depression, unspecified: Secondary | ICD-10-CM

## 2019-05-27 DIAGNOSIS — F419 Anxiety disorder, unspecified: Secondary | ICD-10-CM | POA: Diagnosis not present

## 2019-05-27 MED ORDER — BUPROPION HCL ER (XL) 150 MG PO TB24: 150.0000 mg | ORAL_TABLET | Freq: Every day | ORAL | 1 refills | Status: DC

## 2019-05-27 MED ORDER — PENNSAID 2 % EX SOLN: 1.0000 "application " | Freq: Two times a day (BID) | CUTANEOUS | 3 refills | Status: DC

## 2019-05-27 NOTE — Telephone Encounter (Signed)
Please contact pt to schedule a 1 month follow up virtual appointment on a Monday.

## 2019-05-27 NOTE — Progress Notes (Signed)
Virtual Visit via Video Note  I connected with Rehab Diersen on 05/27/19 at  8:00 AM EST by a video enabled telemedicine application and verified that I am speaking with the correct person using two identifiers.  Location: Patient: work Provider: home   I discussed the limitations of evaluation and management by telemedicine and the availability of in person appointments. The patient expressed understanding and agreed to proceed.  History of Present Illness:  Patient is a 58 yr old female who presents today with chief complaint of anxiety.   GAD 7 : Generalized Anxiety Score 05/27/2019  Nervous, Anxious, on Edge 2  Control/stop worrying 3  Worry too much - different things 3  Trouble relaxing 3  Restless 1  Easily annoyed or irritable 2  Afraid - awful might happen 2  Total GAD 7 Score 16   Patient reports that she she has waves of discomfort in stomach which she attributes to anxiety.  She had a recent EGD which was unremarkable except for some mild gastritis. Notes that she has had a lot of stress. She works in [place] at work and has a lot of employees who are dependent on her. Also reports that her daughter is sick in [place] with COVID-19.  Feels very tense.  Reports that in the beginning of November she got the first check form her father's trust and tha tmade his death real.  Also notes low motivation.  Could spend the whole day in bed.    Past Medical History:  Diagnosis Date  . Anemia    hx of iron deficient anemia  . Arthritis   . Blood transfusion without reported diagnosis   . Depression   . Gastric polyp   . GERD (gastroesophageal reflux disease)   . Hemorrhoids   . HLD (hyperlipidemia)   . Joint pain   . Kidney lesion    right  . Osteopenia 05/04/2017  . Postmenopausal      Social History   Socioeconomic History  . Marital status: Married    Spouse name: Not on file  . Number of children: 2  . Years of education: Not on file  . Highest education level: Not on file   Occupational History  . 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 Financial resource strain: Not on file  . Food insecurity    Worry: Not on file    Inability: Not on file  . Transportation needs    Medical: Not on file    Non-medical: Not on file  Tobacco Use  . Smoking status: Former Smoker    Types: Cigarettes    Quit date: 07/04/1985    Years since quitting: 33.9  . Smokeless tobacco: Never Used  Substance and Sexual Activity  . Alcohol use: Yes    Alcohol/week: 0.0 standard drinks    Comment: 4-5 times a week  . Drug use: No  . Sexual activity: Not on file  Lifestyle  . Physical activity    Days per week: Not on file    Minutes per session: Not on file  . Stress: Not on file  Relationships  . Social Herbalist on phone: Not on file    Gets together: Not on file    Attends religious service: Not on file    Active member of club or organization: Not on file    Attends meetings of clubs or organizations: Not on file    Relationship status:  Not on file  . Intimate partner violence    Fear of current or ex partner: Not on file    Emotionally abused: Not on file    Physically abused: Not on file    Forced sexual activity: Not on file  Other Topics Concern  . 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       Past Surgical History:  Procedure Laterality Date  . ENDOMETRIAL ABLATION  2007   Novasure  . TUBAL LIGATION  1983    Family History  Problem Relation Age of Onset  . Diabetes Mother   . Heart disease Mother   . Breast cancer Mother 66  . Stroke Father   . Diabetes Father   . Heart disease Father        CABG, AVR, A-Fib  . Hepatitis Brother   . Alcohol abuse Brother   . Raynaud  syndrome Sister   . Healthy Daughter   . Depression Son   . Anxiety disorder Son   . Healthy Son     Allergies  Allergen Reactions  . Hydrocodone-Acetaminophen     "made her skin crawl"  . Hydrocodone-Acetaminophen Itching  . Omni-Pac     vomitting  . Voltaren [Diclofenac]     diarrhea    Current Outpatient Medications on File Prior to Visit  Medication Sig Dispense Refill  . atorvastatin (LIPITOR) 10 MG tablet TAKE 1 TABLET BY MOUTH DAILY 90 tablet 0  . cyclobenzaprine (FLEXERIL) 10 MG tablet TAKE ONE TABLET BY MOUTH TWICE A DAY AS NEEDED 90 tablet 0  . ESTRACE VAGINAL 0.1 MG/GM vaginal cream Place 1 application vaginally at bedtime.  1  . estradiol (ESTRACE) 0.5 MG tablet Take 0.5 mg by mouth daily.      Marland Kitchen FLUoxetine (PROZAC) 40 MG capsule TAKE 1 CAPSULE BY MOUTH DAILY 90 capsule 2  . medroxyPROGESTERone (PROVERA) 2.5 MG tablet Take 1 tablet by mouth daily.    . Multiple Vitamins-Minerals (MULTIVITAMIN WITH MINERALS) tablet 1 tablet.      . pantoprazole (PROTONIX) 40 MG tablet Take 1 tablet (40 mg total) by mouth 2 (two) times daily. 60 tablet 11  . predniSONE (DELTASONE) 5 MG tablet Take 6 pills for first day, 5 pills second day, 4 pills third day, 3 pills fourth day, 2 pills the fifth day, and 1 pill sixth day. 21 tablet 0  . predniSONE (STERAPRED UNI-PAK 21 TAB) 5 MG (21) TBPK tablet     . zolpidem (AMBIEN) 10 MG tablet TAKE ONE-HALF TO ONE TABLET BY MOUTH AT BEDTIME AS NEEDED FOR SLEEP 15 tablet 0   No current facility-administered medications on file prior to visit.     There were no vitals taken for this visit.      Observations/Objective:   Gen: Awake, alert, no acute distress Resp: Breathing is even and non-labored Psych: calm/pleasant demeanor Neuro: Alert and Oriented x 3, + facial symmetry, speech is clear.   Assessment and Plan: Anxiety/depression- Will give a trial of wellbutrin.  She is already on prozac 40mg . Will continue. Suggested referral to a  counselor but she declines at this time due to time constraints. Plan follow up in 1 month for re-evaluation.   Follow Up Instructions:  I discussed the assessment and treatment plan with the patient. The patient was provided an opportunity to ask questions and all were answered. The patient agreed with the plan and demonstrated an understanding of the instructions.   The patient was advised to call back or seek an in-person evaluation if the symptoms worsen or if the condition fails to improve as anticipated.  Nance Pear, NP

## 2019-06-04 NOTE — Telephone Encounter (Signed)
No thanks, that should be fine.

## 2019-06-10 ENCOUNTER — Ambulatory Visit
Admission: RE | Admit: 2019-06-10 | Discharge: 2019-06-10 | Disposition: A | Discharge status: Home / Self Care | Payer: BC Managed Care – PPO | Source: Ambulatory Visit | Attending: Gastroenterology | Admitting: Gastroenterology

## 2019-06-10 DIAGNOSIS — R1013 Epigastric pain: Secondary | ICD-10-CM

## 2019-06-10 DIAGNOSIS — N2889 Other specified disorders of kidney and ureter: Secondary | ICD-10-CM | POA: Diagnosis not present

## 2019-06-10 MED ORDER — IOPAMIDOL (ISOVUE-300) INJECTION 61%
100.0000 mL | Freq: Once | INTRAVENOUS | Status: AC | PRN
  Administered 2019-06-10: 100 mL via INTRAVENOUS

## 2019-06-19 ENCOUNTER — Ambulatory Visit: Payer: BC Managed Care – PPO | Admitting: Family Medicine

## 2019-06-19 NOTE — Progress Notes (Deleted)
Haley Fuentes - 58 y.o. female MRN [id number]  Date of birth: 07-21-1960  SUBJECTIVE:  Including CC & ROS.  No chief complaint on file.   Haley Fuentes is a 58 y.o. female that is  ***.  ***   Review of Systems  HISTORY: Past Medical, Surgical, Social, and Family History Reviewed & Updated per EMR.   Pertinent Historical Findings include:  Past Medical History:  Diagnosis Date  . Anemia    hx of iron deficient anemia  . Arthritis   . Blood transfusion without reported diagnosis   . Depression   . Gastric polyp   . GERD (gastroesophageal reflux disease)   . Hemorrhoids   . HLD (hyperlipidemia)   . Joint pain   . Kidney lesion    right  . Osteopenia 05/04/2017  . Postmenopausal     Past Surgical History:  Procedure Laterality Date  . ENDOMETRIAL ABLATION  2007   Novasure  . TUBAL LIGATION  1983    Allergies  Allergen Reactions  . Hydrocodone-Acetaminophen     "made her skin crawl"  . Hydrocodone-Acetaminophen Itching  . Omni-Pac     vomitting  . Voltaren [Diclofenac]     diarrhea    Family History  Problem Relation Age of Onset  . Diabetes Mother   . Heart disease Mother   . Breast cancer Mother 77  . Stroke Father   . Diabetes Father   . Heart disease Father        CABG, AVR, A-Fib  . Hepatitis Brother   . Alcohol abuse Brother   . Raynaud syndrome Sister   . Healthy Daughter   . Depression Son   . Anxiety disorder Son   . Healthy Son      Social History   Socioeconomic History  . Marital status: Married    Spouse name: Not on file  . Number of children: 2  . Years of education: Not on file  . Highest education level: Not on file  Occupational History  . 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 Smoking status: Former Smoker    Types: Cigarettes    Quit date: 07/04/1985    Years since quitting: 33.9  . Smokeless tobacco: Never Used  Substance and Sexual Activity  . Alcohol use: Yes    Alcohol/week: 0.0  standard drinks    Comment: 4-5 times a week  . Drug use: No  . Sexual activity: Not on file  Other Topics Concern  . 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      Social Determinants of Health   Financial Resource Strain:   . Difficulty of Paying Living Expenses: Not on file  Food Insecurity:   . Worried About Charity fundraiser in the Last Year: Not on file  . Ran Out of Food in the Last Year: Not on file  Transportation Needs:   . Lack of Transportation (Medical): Not on file  . Lack of Transportation (Non-Medical): Not on file  Physical Activity:   . Days of Exercise per Week: Not on file  . Minutes of Exercise per Session: Not on file  Stress:   . Feeling of Stress : Not on file  Social Connections:   . Frequency of Communication with Friends and Family: Not on file  . Frequency of Social Gatherings with Friends and Family: Not on file  . Attends Religious Services: Not on file  . Active Member of Clubs or Organizations: Not on file  . Attends Archivist Meetings: Not on file  . Marital Status: Not on file  Intimate Partner Violence:   . Fear of Current or Ex-Partner: Not on file  . Emotionally Abused: Not on file  . Physically Abused: Not on file  . Sexually Abused: Not on file     PHYSICAL EXAM:  VS: There were no vitals taken for this visit. Physical Exam Gen: NAD, alert, cooperative with exam, well-appearing ENT: normal lips, normal nasal mucosa,  Eye: normal EOM, normal conjunctiva and lids CV:  no edema, +2 pedal pulses   Resp: no accessory muscle use, non-labored,  GI: no masses or tenderness, no hernia  Skin: no rashes, no areas of induration  Neuro: normal tone, normal sensation to touch Psych:  normal insight,  alert and oriented MSK:  ***      ASSESSMENT & PLAN:   No problem-specific Assessment & Plan notes found for this encounter.

## 2019-06-20 ENCOUNTER — Ambulatory Visit: Payer: Self-pay

## 2019-06-20 ENCOUNTER — Encounter: Payer: Self-pay | Admitting: Family Medicine

## 2019-06-20 ENCOUNTER — Ambulatory Visit: Payer: BC Managed Care – PPO | Admitting: Family Medicine

## 2019-06-20 ENCOUNTER — Other Ambulatory Visit: Payer: Self-pay

## 2019-06-20 VITALS — BP 122/87 | HR 73 | Ht 63.0 in | Wt 162.0 lb

## 2019-06-20 DIAGNOSIS — M7552 Bursitis of left shoulder: Secondary | ICD-10-CM

## 2019-06-20 MED ORDER — METHYLPREDNISOLONE ACETATE 40 MG/ML IJ SUSP
40.0000 mg | Freq: Once | INTRAMUSCULAR | Status: AC
  Administered 2019-06-20: 40 mg via INTRA_ARTICULAR

## 2019-06-20 NOTE — Progress Notes (Signed)
Haley Fuentes - 58 y.o. female MRN [id number]  Date of birth: 04/14/1961  SUBJECTIVE:  Including CC & ROS.  Chief Complaint  Patient presents with  . Follow-up    FOLLOW UP FOR LEFT SHOULDER    Haley Fuentes is a 58 y.o. female that is presenting with ongoing left shoulder pain.  Had mild improvement with medications.  Pain is still acutely worsened.  It is most noticeable when she is reaching out.  Has tried Pennsaid with limited improvement.  No weakness.  She does have some radiation to the elbow.  Pain can be mild to moderate.  Can be sharp and throbbing.  Has been trying a standing desk as well.  Has tried home exercises.  No numbness or tingling.   Review of Systems  Constitutional: Negative for fever.  HENT: Negative for congestion.   Respiratory: Negative for cough.   Cardiovascular: Negative for chest pain.  Gastrointestinal: Negative for abdominal distention.  Musculoskeletal: Negative for back pain.  Neurological: Negative for weakness.  Hematological: Negative for adenopathy.    HISTORY: Past Medical, Surgical, Social, and Family History Reviewed & Updated per EMR.   Pertinent Historical Findings include:  Past Medical History:  Diagnosis Date  . Anemia    hx of iron deficient anemia  . Arthritis   . Blood transfusion without reported diagnosis   . Depression   . Gastric polyp   . GERD (gastroesophageal reflux disease)   . Hemorrhoids   . HLD (hyperlipidemia)   . Joint pain   . Kidney lesion    right  . Osteopenia 05/04/2017  . Postmenopausal     Past Surgical History:  Procedure Laterality Date  . ENDOMETRIAL ABLATION  2007   Novasure  . TUBAL LIGATION  1983    Allergies  Allergen Reactions  . Hydrocodone-Acetaminophen     "made her skin crawl"  . Hydrocodone-Acetaminophen Itching  . Omni-Pac     vomitting  . Voltaren [Diclofenac]     diarrhea    Family History  Problem Relation Age of Onset  . Diabetes Mother   . Heart disease Mother   .  Breast cancer Mother 75  . Stroke Father   . Diabetes Father   . Heart disease Father        CABG, AVR, A-Fib  . Hepatitis Brother   . Alcohol abuse Brother   . Raynaud syndrome Sister   . Healthy Daughter   . Depression Son   . Anxiety disorder Son   . Healthy Son      Social History   Socioeconomic History  . Marital status: Married    Spouse name: Not on file  . Number of children: 2  . Years of education: Not on file  . Highest education level: Not on file  Occupational History  . 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 Smoking status: Former Smoker    Types: Cigarettes    Quit date: 07/04/1985    Years since quitting: 33.9  . Smokeless tobacco: Never Used  Substance and Sexual Activity  . Alcohol use: Yes    Alcohol/week: 0.0 standard drinks    Comment: 4-5 times a week  . Drug use: No  . Sexual activity: Not on file  Other Topics Concern  . 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      Social Determinants of Health   Financial Resource Strain:   . Difficulty of Paying Living Expenses: Not on file  Food Insecurity:   . Worried About Charity fundraiser in the Last Year: Not on file  . Ran Out of Food in the Last Year: Not on file  Transportation Needs:   . Lack of Transportation (Medical): Not on file  . Lack of Transportation (Non-Medical): Not on file  Physical Activity:   . Days of Exercise per Week: Not on file  . Minutes of Exercise per Session: Not on file  Stress:   . Feeling of Stress : Not on file  Social Connections:   . Frequency of Communication with Friends and Family: Not on file  . Frequency of Social Gatherings with Friends and Family: Not on file  . Attends Religious  Services: Not on file  . Active Member of Clubs or Organizations: Not on file  . Attends Archivist Meetings: Not on file  . Marital Status: Not on file  Intimate Partner Violence:   . Fear of Current or Ex-Partner: Not on file  . Emotionally Abused: Not on file  . Physically Abused: Not on file  . Sexually Abused: Not on file     PHYSICAL EXAM:  VS: BP 122/87   Pulse 73   Ht 5\' 3"  (1.6 m)   Wt 162 lb (73.5 kg)   BMI 28.70 kg/m  Physical Exam Gen: NAD, alert, cooperative with exam, well-appearing ENT: normal lips, normal nasal mucosa,  Eye: normal EOM, normal conjunctiva and lids CV:  no edema, +2 pedal pulses   Resp: no accessory muscle use, non-labored,  Skin: no rashes, no areas of induration  Neuro: normal tone, normal sensation to touch Psych:  normal insight, alert and oriented MSK:  Left shoulder:  Normal flexion and abduction. Normal internal and external rotation. Normal strength resistance. Empty can testing positive. Negative speeds test. Neurovascularly intact   Aspiration/Injection Procedure Note Haley Fuentes [birth year]  Procedure: Injection Indications: Left shoulder pain  Procedure Details Consent: Risks of procedure as well as the alternatives and risks of each were explained to the (patient/caregiver).  Consent for procedure obtained. Time Out: Verified patient identification, verified procedure, site/side was marked, verified correct patient position, special equipment/implants available, medications/allergies/relevent history reviewed, required imaging and test results available.  Performed.  The area was cleaned with iodine and alcohol swabs.    The left subacromial space was injected using 1 cc's of 40 mg Depo-Medrol and 4 cc's of 0.25% bupivacaine with a 22 1 1/2" needle.  Ultrasound was used. Images were obtained in long views showing the injection.     A sterile dressing was applied.  Patient did tolerate procedure  well.      ASSESSMENT & PLAN:   Subacromial bursitis Bursitis observed on previous ultrasound.  Pain most experience with empty can test.  Has good range of motion. -Subacromial injection. -Counseled on home exercise therapy and supportive care. -Could consider glenohumeral injection if pain is ongoing.  Could consider physical therapy.

## 2019-06-20 NOTE — Patient Instructions (Signed)
Good to see you Please try the exercises  Please try ice  Please continue trying the standing desk   Please send me a message in [facility] with any questions or updates.  Please see me back in 4-6 weeks.   --Dr. Raeford Razor

## 2019-06-20 NOTE — Assessment & Plan Note (Signed)
Bursitis observed on previous ultrasound.  Pain most experience with empty can test.  Has good range of motion. -Subacromial injection. -Counseled on home exercise therapy and supportive care. -Could consider glenohumeral injection if pain is ongoing.  Could consider physical therapy.

## 2019-06-21 ENCOUNTER — Ambulatory Visit: Payer: BC Managed Care – PPO | Admitting: Family Medicine

## 2019-06-24 ENCOUNTER — Ambulatory Visit (INDEPENDENT_AMBULATORY_CARE_PROVIDER_SITE_OTHER): Payer: BC Managed Care – PPO | Admitting: Family

## 2019-06-24 ENCOUNTER — Encounter: Payer: Self-pay | Admitting: Family

## 2019-06-24 ENCOUNTER — Other Ambulatory Visit: Payer: Self-pay

## 2019-06-24 DIAGNOSIS — F329 Major depressive disorder, single episode, unspecified: Secondary | ICD-10-CM

## 2019-06-24 DIAGNOSIS — K219 Gastro-esophageal reflux disease without esophagitis: Secondary | ICD-10-CM

## 2019-06-24 DIAGNOSIS — F419 Anxiety disorder, unspecified: Secondary | ICD-10-CM | POA: Diagnosis not present

## 2019-06-24 DIAGNOSIS — F32A Depression, unspecified: Secondary | ICD-10-CM

## 2019-06-24 MED ORDER — BUPROPION HCL ER (XL) 150 MG PO TB24: 150.0000 mg | ORAL_TABLET | Freq: Every day | ORAL | 1 refills | Status: DC

## 2019-06-24 MED ORDER — ZOLPIDEM TARTRATE 10 MG PO TABS: ORAL_TABLET | ORAL | 0 refills | Status: DC

## 2019-06-24 NOTE — Progress Notes (Signed)
Virtual Visit via Video Note  I connected with Haley Fuentes on 06/24/19 at  8:00 AM EST by a video enabled telemedicine application and verified that I am speaking with the correct person using two identifiers.  Location: Patient: home Provider: work   I discussed the limitations of evaluation and management by telemedicine and the availability of in person appointments. The patient expressed understanding and agreed to proceed.  History of Present Illness:  Patient is a 58 yr old female who presents today for follow up. We last met on 03/13/19. She described some palpitations at that time. EKG was performed and noted NSR.  CMET, CBC, TSH were all WNL.    She also noted some epigastric pain and pain up into her esophagus.  She had an EGD which noted mild gastritis.    Anxiety/Depression- last visit she noted increased anxiety and low motivation. Wishing she could stay in bed all day. We added Wellbutrin and she was continued on prozac. Reports fewer " down times." Able to get up each day without having to force herself.  Denies side effects.  She is sleeping well.   Past Medical History:  Diagnosis Date  . Anemia    hx of iron deficient anemia  . Arthritis   . Blood transfusion without reported diagnosis   . Depression   . Gastric polyp   . GERD (gastroesophageal reflux disease)   . Hemorrhoids   . HLD (hyperlipidemia)   . Joint pain   . Kidney lesion    right  . Osteopenia 05/04/2017  . Postmenopausal      Social History   Socioeconomic History  . Marital status: Married    Spouse name: Not on file  . Number of children: 2  . Years of education: Not on file  . Highest education level: Not on file  Occupational History  . 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 Smoking status: Former Smoker    Types: Cigarettes    Quit date: 07/04/1985    Years since quitting: 33.9  . Smokeless tobacco: Never Used  Substance and Sexual Activity  .  Alcohol use: Yes    Alcohol/week: 0.0 standard drinks    Comment: 4-5 times a week  . Drug use: No  . Sexual activity: Not on file  Other Topics Concern  . 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      Social Determinants of Health   Financial Resource Strain:   . Difficulty of Paying Living Expenses: Not on file  Food Insecurity:   . Worried About Charity fundraiser in the Last Year: Not on file  . Ran Out of Food in the Last Year: Not on file  Transportation Needs:   . Lack of Transportation (Medical): Not on file  . Lack of Transportation (Non-Medical): Not on file  Physical Activity:   . Days of Exercise per Week: Not on file  . Minutes of Exercise per Session: Not on file  Stress:   . Feeling of Stress : Not on file  Social Connections:   . Frequency of Communication with Friends and Family: Not on file  . Frequency of Social Gatherings with Friends and Family: Not  on file  . Attends Religious Services: Not on file  . Active Member of Clubs or Organizations: Not on file  . Attends Archivist Meetings: Not on file  . Marital Status: Not on file  Intimate Partner Violence:   . Fear of Current or Ex-Partner: Not on file  . Emotionally Abused: Not on file  . Physically Abused: Not on file  . Sexually Abused: Not on file    Past Surgical History:  Procedure Laterality Date  . ENDOMETRIAL ABLATION  2007   Novasure  . TUBAL LIGATION  1983    Family History  Problem Relation Age of Onset  . Diabetes Mother   . Heart disease Mother   . Breast cancer Mother 6  . Stroke Father   . Diabetes Father   . Heart disease Father        CABG, AVR, A-Fib  . Hepatitis Brother   . Alcohol abuse Brother   . Raynaud syndrome Sister   .  Healthy Daughter   . Depression Son   . Anxiety disorder Son   . Healthy Son     Allergies  Allergen Reactions  . Hydrocodone-Acetaminophen     "made her skin crawl"  . Hydrocodone-Acetaminophen Itching  . Omni-Pac     vomitting  . Voltaren [Diclofenac]     diarrhea    Current Outpatient Medications on File Prior to Visit  Medication Sig Dispense Refill  . atorvastatin (LIPITOR) 10 MG tablet TAKE 1 TABLET BY MOUTH DAILY 90 tablet 0  . buPROPion (WELLBUTRIN XL) 150 MG 24 hr tablet Take 1 tablet (150 mg total) by mouth daily. 30 tablet 1  . cyclobenzaprine (FLEXERIL) 10 MG tablet TAKE ONE TABLET BY MOUTH TWICE A DAY AS NEEDED 90 tablet 0  . Diclofenac Sodium (PENNSAID) 2 % SOLN Place 1 application onto the skin 2 (two) times daily. 112 g 3  . ESTRACE VAGINAL 0.1 MG/GM vaginal cream Place 1 application vaginally at bedtime.  1  . estradiol (ESTRACE) 0.5 MG tablet Take 0.5 mg by mouth daily.      Marland Kitchen FLUoxetine (PROZAC) 40 MG capsule TAKE 1 CAPSULE BY MOUTH DAILY 90 capsule 2  . medroxyPROGESTERone (PROVERA) 2.5 MG tablet Take 1 tablet by mouth daily.    . Multiple Vitamins-Minerals (MULTIVITAMIN WITH MINERALS) tablet 1 tablet.      . pantoprazole (PROTONIX) 40 MG tablet Take 1 tablet (40 mg total) by mouth 2 (two) times daily. 60 tablet 11  . predniSONE (DELTASONE) 5 MG tablet Take 6 pills for first day, 5 pills second day, 4 pills third day, 3 pills fourth day, 2 pills the fifth day, and 1 pill sixth day. 21 tablet 0  . predniSONE (STERAPRED UNI-PAK 21 TAB) 5 MG (21) TBPK tablet     . zolpidem (AMBIEN) 10 MG tablet TAKE ONE-HALF TO ONE TABLET BY MOUTH AT BEDTIME AS NEEDED FOR SLEEP 15 tablet 0   No current facility-administered medications on file prior to visit.    There were no vitals taken for this visit.     Observations/Objective:   Gen: Awake, alert, no acute distress Resp: Breathing is even and non-labored Psych: calm/pleasant demeanor Neuro: Alert and Oriented x 3, +  facial symmetry, speech is clear.   Assessment and Plan:  Anxiety/Depression- improved on wellbutrin. Plan to continue along with prozac.  GERD- improved symptoms overall. She plans to follow up with GI in January.     Follow Up Instructions:    I discussed  the assessment and treatment plan with the patient. The patient was provided an opportunity to ask questions and all were answered. The patient agreed with the plan and demonstrated an understanding of the instructions.   The patient was advised to call back or seek an in-person evaluation if the symptoms worsen or if the condition fails to improve as anticipated.  Nance Pear, NP

## 2019-07-14 ENCOUNTER — Other Ambulatory Visit: Payer: Self-pay | Admitting: Family

## 2019-07-23 ENCOUNTER — Ambulatory Visit: Payer: BC Managed Care – PPO | Admitting: Gastroenterology

## 2019-07-23 ENCOUNTER — Encounter: Payer: Self-pay | Admitting: Gastroenterology

## 2019-07-23 VITALS — BP 112/70 | HR 74 | Temp 97.6°F | Ht 63.0 in | Wt 166.0 lb

## 2019-07-23 DIAGNOSIS — K219 Gastro-esophageal reflux disease without esophagitis: Secondary | ICD-10-CM | POA: Diagnosis not present

## 2019-07-23 MED ORDER — PANTOPRAZOLE SODIUM 40 MG PO TBEC: 40.0000 mg | DELAYED_RELEASE_TABLET | Freq: Every day | ORAL | 11 refills | Status: DC

## 2019-07-23 NOTE — Progress Notes (Signed)
Review of pertinent gastrointestinal problems: 1.  History of precancerous colon polyps ; 08/24/2007 colonoscopy for constipation and hematochezia.  No polyps.  Intermittent rectal bleeding thought likely hemorrhoidal.  Started on fiber.  Colonoscopy 05/2019 single 2 mm polyp was removed.  The examination was otherwise normal.  Pathology proved the polyp to be atypical adenoma without high-grade dysplasia.  She was recommended to have repeat colonoscopy for polyp surveillance at 7-year interval. 2.  Epigastric pain, dyspepsia. 03/30/2011 EGD for epigastric abdominal pain with moderate acute appearing gastritis.  dyspepsia and epigastric pain led to repeat EGD 05/2019 which showed mild nonspecific gastritis.  Biopsies were negative for H. Pylori.  Follow-up CT scan December 2020 showed no clear etiology of her pains as well.   HPI: This is a very pleasant 59 year old woman whom I last saw at the time of an upper endoscopy and colonoscopy about 2 months ago.  See those results summarized above  She has ended up taking her proton pump inhibitor Protonix only once daily instead of twice daily.  She believes that when she takes it twice daily she is having worse constipation.  She takes it in the morning sometimes 3 to 4 hours after breakfast.  She is still bothered by intermittent epigastric burning sensations and a lot of belching.  We spoke a lot today about her stressful job, terrible past year and a half with 2 or 3 deaths in the family, pandemic, significant higher stress levels at work.  She believes that stress manifests as abdominal discomforts and GERD-like symptoms.   ROS: complete GI ROS as described in HPI, all other review negative.  Constitutional:  No unintentional weight loss   Past Medical History:  Diagnosis Date  . Anemia    hx of iron deficient anemia  . Arthritis   . Blood transfusion without reported diagnosis   . Depression   . Gastric polyp   . GERD (gastroesophageal  reflux disease)   . Hemorrhoids   . HLD (hyperlipidemia)   . Joint pain    left shoulder  . Kidney lesion    right  . Osteopenia 05/04/2017  . Postmenopausal     Past Surgical History:  Procedure Laterality Date  . ENDOMETRIAL ABLATION  2007   Novasure  . TUBAL LIGATION  1983    Current Outpatient Medications  Medication Sig Dispense Refill  . atorvastatin (LIPITOR) 10 MG tablet TAKE ONE TABLET BY MOUTH DAILY 90 tablet 0  . buPROPion (WELLBUTRIN XL) 150 MG 24 hr tablet Take 1 tablet (150 mg total) by mouth daily. 90 tablet 1  . cyclobenzaprine (FLEXERIL) 10 MG tablet TAKE ONE TABLET BY MOUTH TWICE A DAY AS NEEDED 90 tablet 0  . Diclofenac Sodium (PENNSAID) 2 % SOLN Place 1 application onto the skin 2 (two) times daily. 112 g 3  . ESTRACE VAGINAL 0.1 MG/GM vaginal cream Place 1 application vaginally at bedtime.  1  . estradiol (ESTRACE) 0.5 MG tablet Take 0.5 mg by mouth daily.      Marland Kitchen FLUoxetine (PROZAC) 40 MG capsule TAKE ONE CAPSULE BY MOUTH DAILY 90 capsule 1  . medroxyPROGESTERone (PROVERA) 2.5 MG tablet Take 1 tablet by mouth daily.    . Multiple Vitamins-Minerals (MULTIVITAMIN WITH MINERALS) tablet 1 tablet.      . pantoprazole (PROTONIX) 40 MG tablet Take 1 tablet (40 mg total) by mouth 2 (two) times daily. 60 tablet 11  . zolpidem (AMBIEN) 10 MG tablet TAKE ONE-HALF TO ONE TABLET BY MOUTH AT BEDTIME AS  NEEDED FOR SLEEP 15 tablet 0   No current facility-administered medications for this visit.    Allergies as of 07/23/2019 - Review Complete 07/23/2019  Allergen Reaction Noted  . Hydrocodone-acetaminophen    . Hydrocodone-acetaminophen Itching   . Omni-pac  02/23/2011  . Voltaren [diclofenac]  09/11/2014    Family History  Problem Relation Age of Onset  . Diabetes Mother   . Heart disease Mother   . Breast cancer Mother 51  . Stroke Father   . Diabetes Father   . Heart disease Father        CABG, AVR, A-Fib  . Hepatitis Brother   . Alcohol abuse Brother   .  Raynaud syndrome Sister   . Healthy Daughter   . Depression Son   . Anxiety disorder Son   . Healthy Son     Social History   Socioeconomic History  . Marital status: Married    Spouse name: Not on file  . Number of children: 2  . Years of education: Not on file  . Highest education level: Not on file  Occupational History  . 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 Smoking status: Former Smoker    Types: Cigarettes    Quit date: 07/04/1985    Years since quitting: 34.0  . Smokeless tobacco: Never Used  Substance and Sexual Activity  . Alcohol use: Yes    Alcohol/week: 0.0 standard drinks    Comment: 4-5 times a week  . Drug use: No  . Sexual activity: Not on file  Other Topics Concern  . 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      Social Determinants of Health   Financial Resource Strain:   . Difficulty of Paying Living Expenses: Not on file  Food Insecurity:   . Worried About Charity fundraiser in the Last Year: Not on file  . Ran Out of Food in the Last Year: Not on file  Transportation Needs:   . Lack of Transportation (Medical): Not on file  . Lack of Transportation (Non-Medical): Not on file  Physical Activity:   . Days of Exercise per Week: Not on file  . Minutes of Exercise per Session: Not on file  Stress:   . Feeling of Stress : Not on file  Social Connections:   . Frequency of Communication with Friends and Family: Not on file  . Frequency of Social Gatherings with Friends and Family: Not on file  . Attends Religious Services: Not on file  . Active Member of Clubs or Organizations: Not on file  . Attends Archivist Meetings: Not on file  . Marital Status: Not on file  Intimate  Partner Violence:   . Fear of Current or Ex-Partner: Not on file  . Emotionally Abused: Not on file  . Physically Abused: Not on file  . Sexually Abused: Not on file     Physical Exam: Temp 97.6 F (36.4 C)   Ht 5\' 3"  (1.6 m)   Wt 166 lb (75.3 kg)   BMI 29.41 kg/m  Constitutional: generally well-appearing Psychiatric: alert and oriented x3 Abdomen: soft, nontender, nondistended, no obvious ascites, no peritoneal signs, normal bowel sounds No peripheral  edema noted in lower extremities  Assessment and plan: 59 y.o. female with GERD, anxiety  She is certainly under a lot of stress, probably more than usual even in this time of pandemic.  She works as a Scientist, research (physical sciences).  2 or 3 family members have passed away in the past year or 2.  She drinks about 1/5 of vodka weekly which can also be playing a role in some of her GI symptoms.  I explained that I will think she needs any further GI testing at this point.  I do think that GERD is probably playing somewhat of a role here, stress may make it worse.  She has not taken proton pump inhibitor the correct time in relation to meals.  Starting now she will take her proton pump inhibitor Protonix once daily and it will be 20 to 30 minutes prior to her lunch meal.  She will also carry Pepcid with her and take on a as needed basis.  She knows to contact me if she has any further questions or concerns.  Please see the "Patient Instructions" section for addition details about the plan.  Owens Loffler, MD [facility] 07/23/2019, 2:57 PM   Total time on date of encounter was 21 minutes  (this included time spent preparing to see the patient reviewing records; obtaining and/or reviewing separately obtained history; performing a medically appropriate exam and/or evaluation; counseling and educating the patient and family if present; ordering medications, tests or procedures if applicable; and documenting clinical information in the health  record).

## 2019-07-23 NOTE — Patient Instructions (Signed)
If you are age 59 or older, your body mass index should be between 23-30. Your Body mass index is 29.41 kg/m. If this is out of the aforementioned range listed, please consider follow up with your Primary Care Provider.  If you are age 49 or younger, your body mass index should be between 19-25. Your Body mass index is 29.41 kg/m. If this is out of the aformentioned range listed, please consider follow up with your Primary Care Provider.   Please purchase the following medications over the counter and take as directed:  START: pepcid 20mg  one tablet daily as needed for stressful situations.   DECREASE: protonix 40mg  to once daily  You will follow up with Dr Ardis Hughs as needed if symptoms worsen or fail to improve.  Thank you, Dr Ardis Hughs

## 2019-07-26 ENCOUNTER — Other Ambulatory Visit: Payer: Self-pay | Admitting: Family

## 2019-08-01 ENCOUNTER — Ambulatory Visit: Payer: BC Managed Care – PPO | Admitting: Family Medicine

## 2019-08-01 ENCOUNTER — Other Ambulatory Visit: Payer: Self-pay

## 2019-08-01 ENCOUNTER — Encounter: Payer: Self-pay | Admitting: Family Medicine

## 2019-08-01 DIAGNOSIS — M7552 Bursitis of left shoulder: Secondary | ICD-10-CM

## 2019-08-01 MED ORDER — NITROGLYCERIN 0.2 MG/HR TD PT24: MEDICATED_PATCH | TRANSDERMAL | 11 refills | Status: DC

## 2019-08-01 NOTE — Progress Notes (Signed)
Haley Fuentes - 59 y.o. female MRN [id number]  Date of birth: 1961-05-17  SUBJECTIVE:  Including CC & ROS.  Chief Complaint  Patient presents with  . Follow-up    follow up for left shoulder    Haley Fuentes is a 59 y.o. female that is following up for her left shoulder pain.  She has been doing well since the injection.  Has been using the Pennsaid.  Has been doing the home exercises.  She still feels pain intermittently from time to time that is mild.   Review of Systems See HPI   HISTORY: Past Medical, Surgical, Social, and Family History Reviewed & Updated per EMR.   Pertinent Historical Findings include:  Past Medical History:  Diagnosis Date  . Anemia    hx of iron deficient anemia  . Arthritis   . Blood transfusion without reported diagnosis   . Depression   . Gastric polyp   . GERD (gastroesophageal reflux disease)   . Hemorrhoids   . HLD (hyperlipidemia)   . Joint pain    left shoulder  . Kidney lesion    right  . Osteopenia 05/04/2017  . Postmenopausal     Past Surgical History:  Procedure Laterality Date  . ENDOMETRIAL ABLATION  2007   Novasure  . TUBAL LIGATION  1983    Allergies  Allergen Reactions  . Hydrocodone-Acetaminophen     "made her skin crawl"  . Hydrocodone-Acetaminophen Itching  . Omni-Pac     vomitting  . Voltaren [Diclofenac]     diarrhea    Family History  Problem Relation Age of Onset  . Diabetes Mother   . Heart disease Mother   . Breast cancer Mother 13  . Stroke Father   . Diabetes Father   . Heart disease Father        CABG, AVR, A-Fib  . Hepatitis Brother   . Alcohol abuse Brother   . Raynaud syndrome Sister   . Healthy Daughter   . Depression Son   . Anxiety disorder Son   . Healthy Son      Social History   Socioeconomic History  . Marital status: Married    Spouse name: Not on file  . Number of children: 2  . Years of education: Not on file  . Highest education level: Not on file  Occupational History  .  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 Smoking status: Former Smoker    Types: Cigarettes    Quit date: 07/04/1985    Years since quitting: 34.0  . Smokeless tobacco: Never Used  Substance and Sexual Activity  . Alcohol use: Yes    Alcohol/week: 0.0 standard drinks    Comment: 4-5 times a week  . Drug use: No  . Sexual activity: Not on file  Other Topics Concern  . 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      Social Determinants of Health   Financial Resource Strain:   . Difficulty of Paying Living Expenses: Not on file  Food Insecurity:   . Worried About Charity fundraiser in the Last Year: Not on file  . Ran Out of Food in the Last Year: Not on file  Transportation Needs:   . Film/video editor (Medical): Not on file  . Lack of Transportation (Non-Medical): Not on file  Physical Activity:   . Days of Exercise per Week: Not on file  . Minutes of Exercise per Session: Not on file  Stress:   . Feeling of Stress : Not on file  Social Connections:   . Frequency of Communication with Friends and Family: Not on file  . Frequency of Social Gatherings with Friends and Family: Not on file  . Attends Religious Services: Not on file  . Active Member of Clubs or Organizations: Not on file  . Attends Archivist Meetings: Not on file  . Marital Status: Not on file  Intimate Partner Violence:   . Fear of Current or Ex-Partner: Not on file  . Emotionally Abused: Not on file  . Physically Abused: Not on file  . Sexually Abused: Not on file     PHYSICAL EXAM:  VS: BP 136/89   Pulse 85   Ht 5\' 3"  (1.6 m)   Wt 162 lb (73.5 kg)   BMI 28.70 kg/m  Physical Exam Gen: NAD, alert, cooperative with exam,  well-appearing ENT: normal lips, normal nasal mucosa,  Eye: normal EOM, normal conjunctiva and lids Skin: no rashes, no areas of induration  Neuro: normal tone, normal sensation to touch Psych:  normal insight, alert and oriented MSK:  Left shoulder: Normal range of motion. Normal strength resistance.   Normal internal and external rotation. Neurovascularly intact     ASSESSMENT & PLAN:   Subacromial bursitis Pain is improved since the injection.  Still occurring intermittently. -Nitro patches. -Counseled on home exercise therapy and supportive care. -Can follow-up in 2 months.  Could consider physical therapy or glenohumeral injection.

## 2019-08-01 NOTE — Patient Instructions (Signed)
Good to see you Please continue the exercises  Please use ice if needed  Please try the nitro patches   Please send me a message in MyChart with any questions or updates.  Please see me back in 2 months or as needed .   --Dr. Raeford Razor  Nitroglycerin Protocol   Apply 1/4 nitroglycerin patch to affected area daily.  Change position of patch within the affected area every 24 hours.  You may experience a headache during the first 1-2 weeks of using the patch, these should subside.  If you experience headaches after beginning nitroglycerin patch treatment, you may take your preferred over the counter pain reliever.  Another side effect of the nitroglycerin patch is skin irritation or rash related to patch adhesive.  Please notify our office if you develop more severe headaches or rash, and stop the patch.  Tendon healing with nitroglycerin patch may require 12 to 24 weeks depending on the extent of injury.  Men should not use if taking Viagra, Cialis, or Levitra.   Do not use if you have migraines or rosacea.

## 2019-08-01 NOTE — Assessment & Plan Note (Signed)
Pain is improved since the injection.  Still occurring intermittently. -Nitro patches. -Counseled on home exercise therapy and supportive care. -Can follow-up in 2 months.  Could consider physical therapy or glenohumeral injection.

## 2019-08-06 ENCOUNTER — Other Ambulatory Visit: Payer: Self-pay

## 2019-08-06 ENCOUNTER — Inpatient Hospital Stay: Admission: RE | Admit: 2019-08-06 | Payer: BC Managed Care – PPO | Source: Ambulatory Visit

## 2019-08-07 ENCOUNTER — Encounter: Payer: Self-pay | Admitting: Medical

## 2019-08-07 ENCOUNTER — Other Ambulatory Visit: Payer: Self-pay

## 2019-08-07 ENCOUNTER — Ambulatory Visit: Payer: BC Managed Care – PPO | Admitting: Medical

## 2019-08-07 VITALS — BP 118/78 | HR 73 | Temp 95.7°F | Resp 12 | Ht 63.0 in | Wt 165.6 lb

## 2019-08-07 DIAGNOSIS — K120 Recurrent oral aphthae: Secondary | ICD-10-CM

## 2019-08-07 DIAGNOSIS — R5383 Other fatigue: Secondary | ICD-10-CM | POA: Diagnosis not present

## 2019-08-07 LAB — FOLATE: Folate: 18.1 ng/mL (ref >5.9)

## 2019-08-07 LAB — VITAMIN B12: Vitamin B-12: 492 pg/mL (ref 211–911)

## 2019-08-07 MED ORDER — MAGIC MOUTHWASH W/LIDOCAINE: ORAL | 0 refills | Status: DC

## 2019-08-07 MED FILL — MMW:MAALOX/BENAD/LIDO 1:1:2: 10 days supply | Qty: 200 | Fill #0

## 2019-08-07 NOTE — Patient Instructions (Addendum)
For mouth ulcers/viral stomatis will rx magic mouth wash. Will work up reported fatigue and may find associated cause for ulcers.  If with treatment and time ulcers persist then may need to re-evaluate or refer.  For fatigue will get b12, b1, vit d, and folate  Follow up date to be determined after lab review. Update by my chart in 5-7 days.

## 2019-08-07 NOTE — Progress Notes (Signed)
   Subjective:    Patient ID: Haley Fuentes, female    DOB: 08-15-1960, 60 y.o.   MRN: [id number]  HPI  Pt in for mouth sores. She states has some mouth sores for a couple of weeks. Slight sore area on tongue at first then noticed small ulcer on left side buccal mucosa. Then noticed just recently back of throat/described near tonsil area small ulcer per pt description. Pt tried over the counter mouth rinse and it helped.   Pt states her diet is healthy. Denies eating any candies.. No heavy consumption of junk food.    Review of Systems     Objective:   Physical Exam  General Mental Status- Alert. General Appearance- Not in acute distress.   heent- negative except back of throat. Adjacent to tonsil but not on one apthous ulcer.   Neck Carotid Arteries- Normal color. Moisture- Normal Moisture. .  Chest and Lung Exam Auscultation: Breath Sounds:-Normal.  Cardiovascular Auscultation:Rythm- Regular. Murmurs & Other Heart Sounds:Auscultation of the heart reveals- No Murmurs.  Neurologic Cranial Nerve exam:- CN III-XII intact(No nystagmus), symmetric smile. Strength:- 5/5 equal and symmetric strength both upper and lower extremities.      Assessment & Plan:  For mouth ulcers/viral stomatis will rx magic mouth wash. Will work up reported fatigue and may find associated cause for ulcers.  If with treatment and time ulcers persist then may need to re-evaluate or refer.  For fatigue will get b12, b1, vit d, and folate  Follow up date to be determined after lab review. Update by my chart in 5-7 days.  20 + minutes spent with pt.  Mackie Pai, PA-C

## 2019-08-10 LAB — VITAMIN B1: Vitamin B1 (Thiamine): 8 nmol/L (ref 8–30)

## 2019-08-10 LAB — VITAMIN D 1,25 DIHYDROXY
Vitamin D 1, 25 (OH)2 Total: 29 pg/mL (ref 18–72)
Vitamin D2 1, 25 (OH)2: <8 pg/mL
Vitamin D3 1, 25 (OH)2: 29 pg/mL

## 2019-08-12 NOTE — Progress Notes (Signed)
Message sent to patient on my chart

## 2019-08-22 ENCOUNTER — Encounter: Payer: Self-pay | Admitting: Family Medicine

## 2019-08-23 ENCOUNTER — Other Ambulatory Visit: Payer: Self-pay | Admitting: Family Medicine

## 2019-08-23 MED ORDER — PREDNISONE 5 MG PO TABS: ORAL_TABLET | ORAL | 0 refills | Status: DC

## 2019-10-01 ENCOUNTER — Other Ambulatory Visit: Payer: Self-pay

## 2019-10-02 ENCOUNTER — Encounter: Payer: Self-pay | Admitting: Family

## 2019-10-02 ENCOUNTER — Other Ambulatory Visit: Payer: Self-pay

## 2019-10-02 ENCOUNTER — Ambulatory Visit (INDEPENDENT_AMBULATORY_CARE_PROVIDER_SITE_OTHER): Payer: BC Managed Care – PPO | Admitting: Family

## 2019-10-02 VITALS — BP 119/81 | HR 67 | Temp 97.8°F | Resp 16 | Ht 63.0 in | Wt 159.0 lb

## 2019-10-02 DIAGNOSIS — Z6828 Body mass index (BMI) 28.0-28.9, adult: Secondary | ICD-10-CM | POA: Diagnosis not present

## 2019-10-02 DIAGNOSIS — Z87898 Personal history of other specified conditions: Secondary | ICD-10-CM

## 2019-10-02 DIAGNOSIS — H5213 Myopia, bilateral: Secondary | ICD-10-CM | POA: Diagnosis not present

## 2019-10-02 DIAGNOSIS — Z0000 Encounter for general adult medical examination without abnormal findings: Secondary | ICD-10-CM | POA: Diagnosis not present

## 2019-10-02 DIAGNOSIS — Z01419 Encounter for gynecological examination (general) (routine) without abnormal findings: Secondary | ICD-10-CM | POA: Diagnosis not present

## 2019-10-02 DIAGNOSIS — R0789 Other chest pain: Secondary | ICD-10-CM | POA: Diagnosis not present

## 2019-10-02 LAB — LIPID PANEL
Cholesterol: 233 mg/dL — ABNORMAL HIGH (ref 0–200)
HDL: 49 mg/dL (ref >39.00)
LDL Cholesterol: 154 mg/dL — ABNORMAL HIGH (ref 0–99)
NonHDL: 184.37
Total CHOL/HDL Ratio: 5
Triglycerides: 154 mg/dL — ABNORMAL HIGH (ref 0.0–149.0)
VLDL: 30.8 mg/dL (ref 0.0–40.0)

## 2019-10-02 LAB — BASIC METABOLIC PANEL
BUN: 16 mg/dL (ref 6–23)
CO2: 30 mEq/L (ref 19–32)
Calcium: 9.7 mg/dL (ref 8.4–10.5)
Chloride: 103 mEq/L (ref 96–112)
Creatinine, Ser: 0.71 mg/dL (ref 0.40–1.20)
GFR: 84.35 mL/min (ref >60.00)
Glucose, Bld: 85 mg/dL (ref 70–99)
Potassium: 4.3 mEq/L (ref 3.5–5.1)
Sodium: 139 mEq/L (ref 135–145)

## 2019-10-02 LAB — CBC WITH DIFFERENTIAL/PLATELET
Basophils Absolute: 0 10*3/uL (ref 0.0–0.1)
Basophils Relative: 0.7 % (ref 0.0–3.0)
Eosinophils Absolute: 0.1 10*3/uL (ref 0.0–0.7)
Eosinophils Relative: 1.6 % (ref 0.0–5.0)
HCT: 36.6 % (ref 36.0–46.0)
Hemoglobin: 12.4 g/dL (ref 12.0–15.0)
Lymphocytes Relative: 27.5 % (ref 12.0–46.0)
Lymphs Abs: 2 10*3/uL (ref 0.7–4.0)
MCHC: 33.8 g/dL (ref 30.0–36.0)
MCV: 94.9 fl (ref 78.0–100.0)
Monocytes Absolute: 0.5 10*3/uL (ref 0.1–1.0)
Monocytes Relative: 6.5 % (ref 3.0–12.0)
Neutro Abs: 4.5 10*3/uL (ref 1.4–7.7)
Neutrophils Relative %: 63.7 % (ref 43.0–77.0)
Platelets: 234 10*3/uL (ref 150.0–400.0)
RBC: 3.86 Mil/uL — ABNORMAL LOW (ref 3.87–5.11)
RDW: 12.7 % (ref 11.5–15.5)
WBC: 7.1 10*3/uL (ref 4.0–10.5)

## 2019-10-02 LAB — HEPATIC FUNCTION PANEL
ALT: 19 U/L (ref 0–35)
AST: 18 U/L (ref 0–37)
Albumin: 4.7 g/dL (ref 3.5–5.2)
Alkaline Phosphatase: 76 U/L (ref 39–117)
Bilirubin, Direct: 0.1 mg/dL (ref 0.0–0.3)
Total Bilirubin: 0.6 mg/dL (ref 0.2–1.2)
Total Protein: 7 g/dL (ref 6.0–8.3)

## 2019-10-02 LAB — TSH: TSH: 1.57 u[IU]/mL (ref 0.35–4.50)

## 2019-10-02 NOTE — Patient Instructions (Signed)
Please complete lab work prior to leaving. You should be contacted about your referral to cardiology.

## 2019-10-02 NOTE — Progress Notes (Signed)
Subjective:    Patient ID: Haley Fuentes, female    DOB: [birth year], 59 y.o.   MRN: [id number]  HPI  Patient presents today for complete physical.  Immunizations: has had pfizer on 09/27/19.   Tdap up to date Diet: eating healthy Wt Readings from Last 3 Encounters:  10/02/19 159 lb (72.1 kg)  08/07/19 165 lb 9.6 oz (75.1 kg)  08/01/19 162 lb (73.5 kg)  Exercise: walks 5 days a week while working Colonoscopy: 05/10/19 Dexa: normal 2018 Pap Smear: 9/18- per GYN Mammogram: will complete in June following covid series  Reports resolution of palpitations. Epigastric pain- worse with stress.  Feels like it radiates up into her esophagus sometimes. Saw GI,  Does not occur with exercise.   Denies DOE.   Symptoms typically improve with Tums.   Reports symptoms are overall worse    Review of Systems  Constitutional: Negative for unexpected weight change.  HENT: Negative for hearing loss and rhinorrhea.   Eyes: Negative for visual disturbance.  Respiratory: Negative for cough and shortness of breath.   Cardiovascular: Negative for leg swelling.  Gastrointestinal: Negative for constipation and diarrhea.  Genitourinary: Negative for dysuria, frequency and hematuria.  Musculoskeletal: Positive for arthralgias (left shoulder pain- sees Dr.  Raeford Razor). Negative for myalgias.  Skin: Negative for rash.  Neurological: Negative for headaches.  Hematological: Negative for adenopathy.  Psychiatric/Behavioral:       Husband is depressed This is causing her depressed   Past Medical History:  Diagnosis Date  . Anemia    hx of iron deficient anemia  . Arthritis   . Blood transfusion without reported diagnosis   . Depression   . Gastric polyp   . GERD (gastroesophageal reflux disease)   . Hemorrhoids   . HLD (hyperlipidemia)   . Joint pain    left shoulder  . Kidney lesion    right  . Osteopenia 05/04/2017  . Postmenopausal      Social History   Socioeconomic History  . Marital  status: Married    Spouse name: Not on file  . Number of children: 2  . Years of education: Not on file  . Highest education level: Not on file  Occupational History  . 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 Smoking status: Former Smoker    Types: Cigarettes    Quit date: 07/04/1985    Years since quitting: 34.2  . Smokeless tobacco: Never Used  Substance and Sexual Activity  . Alcohol use: Yes    Alcohol/week: 0.0 standard drinks    Comment: 4-5 times a week  . Drug use: No  . Sexual activity: Not on file  Other Topics Concern  . 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      Social Determinants of Health   Financial Resource Strain:   . Difficulty of Paying Living Expenses:   Food Insecurity:   . Worried About Charity fundraiser in the Last Year:   . Arboriculturist in the Last Year:   Transportation Needs:   . Film/video editor (Medical):   Marland Kitchen Lack of Transportation (Non-Medical):   Physical Activity:   . Days of Exercise per Week:   .  Minutes of Exercise per Session:   Stress:   . Feeling of Stress :   Social Connections:   . Frequency of Communication with Friends and Family:   . Frequency of Social Gatherings with Friends and Family:   . Attends Religious Services:   . Active Member of Clubs or Organizations:   . Attends Archivist Meetings:   Marland Kitchen Marital Status:   Intimate Partner Violence:   . Fear of Current or Ex-Partner:   . Emotionally Abused:   Marland Kitchen Physically Abused:   . Sexually Abused:     Past Surgical History:  Procedure Laterality Date  . ENDOMETRIAL ABLATION  2007   Novasure  . TUBAL LIGATION  1983    Family History  Problem Relation Age of Onset  . Diabetes  Mother   . Heart disease Mother   . Breast cancer Mother 40  . Stroke Father   . Diabetes Father   . Heart disease Father        CABG, AVR, A-Fib  . Hepatitis Brother   . Alcohol abuse Brother   . Raynaud syndrome Sister   . Healthy Daughter   . Depression Son   . Anxiety disorder Son   . Healthy Son     Allergies  Allergen Reactions  . Hydrocodone-Acetaminophen     "made her skin crawl"  . Hydrocodone-Acetaminophen Itching  . Omni-Pac     vomitting  . Voltaren [Diclofenac]     diarrhea    Current Outpatient Medications on File Prior to Visit  Medication Sig Dispense Refill  . atorvastatin (LIPITOR) 10 MG tablet TAKE ONE TABLET BY MOUTH DAILY 90 tablet 0  . buPROPion (WELLBUTRIN XL) 150 MG 24 hr tablet Take 1 tablet (150 mg total) by mouth daily. 90 tablet 1  . cyclobenzaprine (FLEXERIL) 10 MG tablet TAKE ONE TABLET BY MOUTH TWICE A DAY AS NEEDED 90 tablet 0  . Diclofenac Sodium (PENNSAID) 2 % SOLN Place 1 application onto the skin 2 (two) times daily. 112 g 3  . ESTRACE VAGINAL 0.1 MG/GM vaginal cream Place 1 application vaginally at bedtime.  1  . estradiol (ESTRACE) 0.5 MG tablet Take 0.5 mg by mouth daily.      Marland Kitchen FLUoxetine (PROZAC) 40 MG capsule TAKE ONE CAPSULE BY MOUTH DAILY 90 capsule 1  . magic mouthwash w/lidocaine SOLN 5 ml po qid swish and spit. 200 mL 0  . medroxyPROGESTERone (PROVERA) 2.5 MG tablet Take 1 tablet by mouth daily.    . Multiple Vitamins-Minerals (MULTIVITAMIN WITH MINERALS) tablet 1 tablet.      . nitroGLYCERIN (NITRODUR - DOSED IN MG/24 HR) 0.2 mg/hr patch Cut and apply 1/4 patch to most painful area q24h. (Patient not taking: Reported on 08/07/2019) 30 patch 11  . pantoprazole (PROTONIX) 40 MG tablet Take 1 tablet (40 mg total) by mouth daily. Take 20-30 minutes prior to lunch meal 30 tablet 11  . predniSONE (DELTASONE) 5 MG tablet Take 6 pills for first day, 5 pills second day, 4 pills third day, 3 pills fourth day, 2 pills the fifth day, and 1  pill sixth day. 21 tablet 0  . zolpidem (AMBIEN) 10 MG tablet TAKE ONE-HALF TO ONE TABLET BY MOUTH AT BEDTIME AS NEEDED FOR SLEEP 15 tablet 0  . [DISCONTINUED] cyclobenzaprine (FLEXERIL) 10 MG tablet TAKE ONE TABLET BY MOUTH TWICE A DAY AS NEEDED 90 tablet 0   No current facility-administered medications on file prior to visit.    BP 119/81 (BP Location:  Right Arm, Patient Position: Sitting, Cuff Size: Small)   Pulse 67   Temp 97.8 F (36.6 C) (Temporal)   Resp 16   Ht 5\' 3"  (1.6 m)   Wt 159 lb (72.1 kg)   SpO2 98%   BMI 28.17 kg/m       Objective:   Physical Exam  Physical Exam  Constitutional: She is oriented to person, place, and time. She appears well-developed and well-nourished. No distress.  HENT:  Head: Normocephalic and atraumatic.  Right Ear: Tympanic membrane and ear canal normal.  Left Ear: Tympanic membrane and ear canal normal.  Mouth/Throat: not examined- pt wearing mask Eyes: Pupils are equal, round, and reactive to light. No scleral icterus.  Neck: Normal range of motion. No thyromegaly present.  Cardiovascular: Normal rate and regular rhythm.   No murmur heard. Pulmonary/Chest: Effort normal and breath sounds normal. No respiratory distress. He has no wheezes. She has no rales. She exhibits no tenderness.  Abdominal: Soft. Bowel sounds are normal. She exhibits no distension and no mass. There is no tenderness. There is no rebound and no guarding.  Musculoskeletal: She exhibits no edema.  Lymphadenopathy:    She has no cervical adenopathy.  Neurological: She is alert and oriented to person, place, and time. She has normal patellar reflexes. She exhibits normal muscle tone. Coordination normal.  Skin: Skin is warm and dry.  Psychiatric: She has a normal mood and affect. Her behavior is normal. Judgment and thought content normal.  Breast/pelvic: deferred          Assessment & Plan:        Assessment & Plan:  Atypical chest pain- EKG tracing is  personally reviewed and compared to previous EKG.  Not is made of TWI in V2-V4.  Will refer to cardiology for further evaluation.   Preventative care- discussed healthy diet and regular exercise. Colo/pap/mammo up to date. Immunizations reviewed and up to date. Obtain routine lab work.   This visit occurred during the SARS-CoV-2 public health emergency.  Safety protocols were in place, including screening questions prior to the visit, additional usage of staff PPE, and extensive cleaning of exam room while observing appropriate contact time as indicated for disinfecting solutions.

## 2019-10-04 ENCOUNTER — Encounter: Payer: Self-pay | Admitting: Cardiology

## 2019-10-04 ENCOUNTER — Encounter: Payer: Self-pay | Admitting: Family

## 2019-10-04 ENCOUNTER — Ambulatory Visit: Payer: BC Managed Care – PPO | Admitting: Cardiology

## 2019-10-04 ENCOUNTER — Other Ambulatory Visit: Payer: Self-pay

## 2019-10-04 VITALS — BP 122/70 | HR 71 | Ht 63.0 in | Wt 162.0 lb

## 2019-10-04 DIAGNOSIS — R079 Chest pain, unspecified: Secondary | ICD-10-CM | POA: Diagnosis not present

## 2019-10-04 DIAGNOSIS — E782 Mixed hyperlipidemia: Secondary | ICD-10-CM | POA: Diagnosis not present

## 2019-10-04 DIAGNOSIS — R9431 Abnormal electrocardiogram [ECG] [EKG]: Secondary | ICD-10-CM

## 2019-10-04 DIAGNOSIS — R072 Precordial pain: Secondary | ICD-10-CM

## 2019-10-04 MED ORDER — METOPROLOL TARTRATE 100 MG PO TABS: 100.0000 mg | ORAL_TABLET | Freq: Once | ORAL | 0 refills | Status: DC

## 2019-10-04 NOTE — Progress Notes (Signed)
Cardiology Office Note:    Date:  10/04/2019   ID:  Haley Fuentes, DOB 1961-06-25, MRN [id number]  PCP:  [name], NP  Cardiologist:  Berniece Salines, DO  Electrophysiologist:  None   Referring MD: [name], NP   The patient was referred by her pcp for chest pain.  History of Present Illness:    Haley Fuentes is a 59 y.o. female with a hx of hyperlipidemia, anxiety and arthritis/arthralgias with joint and shoulder pain (per the patient she has been treated with nitroglycerin patches by Dr.Schmitz).  The patient was referred by her primary care provider to be evaluated for chest pain.  The patient tells me that she has been experiencing for several months now midsternal chest pressure-like sensation.  States that it started in the midsternal area moved up and across her left chest.  She tells me that it happens with several times a week.  She quantifies the pain about a 4 out of 10 and usually tells me that it comes on for 30 to 60 seconds at a time.  She is pretty sure this is not her GERD because that sensation is a lot different it feels more burning.  She denies any soreness of breath, nausea, vomiting.  Past Medical History:  Diagnosis Date  . Anemia    hx of iron deficient anemia  . Arthritis   . Blood transfusion without reported diagnosis   . Depression   . Gastric polyp   . GERD (gastroesophageal reflux disease)   . Hemorrhoids   . HLD (hyperlipidemia)   . Joint pain    left shoulder  . Kidney lesion    right  . Osteopenia 05/04/2017  . Postmenopausal     Past Surgical History:  Procedure Laterality Date  . ENDOMETRIAL ABLATION  2007   Novasure  . TUBAL LIGATION  1983    Current Medications: Current Meds  Medication Sig  . atorvastatin (LIPITOR) 10 MG tablet TAKE ONE TABLET BY MOUTH DAILY  . buPROPion (WELLBUTRIN XL) 150 MG 24 hr tablet Take 1 tablet (150 mg total) by mouth daily.  . cyclobenzaprine (FLEXERIL) 10 MG tablet TAKE ONE TABLET BY MOUTH  TWICE A DAY AS NEEDED  . ESTRACE VAGINAL 0.1 MG/GM vaginal cream Place 1 application vaginally at bedtime.  Marland Kitchen estradiol (ESTRACE) 0.5 MG tablet Take 0.5 mg by mouth daily.    Marland Kitchen FLUoxetine (PROZAC) 40 MG capsule TAKE ONE CAPSULE BY MOUTH DAILY  . GERI-LANTA 200-200-20 MG/5ML suspension 2 (two) times daily. 2 squirts twice per day  . magic mouthwash w/lidocaine SOLN 5 ml po qid swish and spit.  . medroxyPROGESTERone (PROVERA) 2.5 MG tablet Take 1 tablet by mouth daily.  . Multiple Vitamins-Minerals (MULTIVITAMIN WITH MINERALS) tablet 1 tablet.    . nitroGLYCERIN (NITRODUR - DOSED IN MG/24 HR) 0.4 mg/hr patch Place 0.4 mg onto the skin daily. Cut and apply 1/4 of the patch to the most painful area.  . pantoprazole (PROTONIX) 40 MG tablet Take 1 tablet (40 mg total) by mouth daily. Take 20-30 minutes prior to lunch meal  . predniSONE (DELTASONE) 5 MG tablet Take 6 pills for first day, 5 pills second day, 4 pills third day, 3 pills fourth day, 2 pills the fifth day, and 1 pill sixth day.  . zolpidem (AMBIEN) 10 MG tablet TAKE ONE-HALF TO ONE TABLET BY MOUTH AT BEDTIME AS NEEDED FOR SLEEP     Allergies:   Hydrocodone-acetaminophen, Hydrocodone-acetaminophen, Omni-pac, and Voltaren [diclofenac]   Social History  Socioeconomic History  . Marital status: Married    Spouse name: Not on file  . Number of children: 2  . Years of education: Not on file  . Highest education level: Not on file  Occupational History  . 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 Smoking status: Former Smoker    Types: Cigarettes    Quit date: 07/04/1985    Years since quitting: 34.2  . Smokeless tobacco: Never Used  Substance and Sexual Activity  . Alcohol use: Yes    Alcohol/week: 0.0 standard drinks    Comment: 4-5 times a week  . Drug use: No  . Sexual activity: Not on file  Other Topics Concern  . 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      Social Determinants of Health   Financial Resource Strain:   . Difficulty of Paying Living Expenses:   Food Insecurity:   . Worried About Charity fundraiser in the Last Year:   . Arboriculturist in the Last Year:   Transportation Needs:   . Film/video editor (Medical):   Marland Kitchen Lack of Transportation (Non-Medical):   Physical Activity:   . Days of Exercise per Week:   . Minutes of Exercise per Session:   Stress:   . Feeling of Stress :   Social Connections:   . Frequency of Communication with Friends and Family:   . Frequency of Social Gatherings with Friends and Family:   . Attends Religious Services:   . Active Member of Clubs or Organizations:   . Attends Archivist Meetings:   Marland Kitchen Marital Status:      Family History: The patient's family history includes Alcohol abuse in her brother; Anxiety disorder in her son; Breast cancer (age of onset: 2) in her mother; Depression in her son; Diabetes in her father and mother; Healthy in her daughter and son; Heart disease in her father and mother; Hepatitis in her brother; Raynaud syndrome in her sister; Stroke in her father.  ROS:   Review of Systems  Constitution: Negative for decreased appetite, fever and weight gain.  HENT: Negative for congestion, ear discharge, hoarse voice and sore throat.   Eyes: Negative for discharge, redness, vision loss in right eye and visual halos.  Cardiovascular: Negative for chest pain, dyspnea on exertion, leg swelling, orthopnea and palpitations.  Respiratory: Negative for cough, hemoptysis, shortness of breath and snoring.   Endocrine: Negative for heat intolerance and polyphagia.  Hematologic/Lymphatic: Negative for bleeding problem. Does not bruise/bleed easily.  Skin: Negative for  flushing, nail changes, rash and suspicious lesions.  Musculoskeletal: Negative for arthritis, joint pain, muscle cramps, myalgias, neck pain and stiffness.  Gastrointestinal: Negative for abdominal pain, bowel incontinence, diarrhea and excessive appetite.  Genitourinary: Negative for decreased libido, genital sores and incomplete emptying.  Neurological: Negative for brief paralysis, focal weakness, headaches and loss of balance.  Psychiatric/Behavioral: Negative for altered mental status, depression and suicidal ideas.  Allergic/Immunologic: Negative for HIV exposure and persistent infections.    EKGs/Labs/Other Studies Reviewed:    The following studies were reviewed today:   EKG:  The ekg ordered today demonstrates sinus rhythm, heart rate 70 bpm with T  wave inversions in V2-V5 similar to EKG done on October 02 2019.  Review her exercise tolerance test which was done on May 13, 2025 which reported 85% of maximal heart rate was achieved after 6 minutes.  There are no ST segment deviations noted during stress.  Recent Labs: 10/02/2019: ALT 19; BUN 16; Creatinine, Ser 0.71; Hemoglobin 12.4; Platelets 234.0; Potassium 4.3; Sodium 139; TSH 1.57  Recent Lipid Panel    Component Value Date/Time   CHOL 233 (H) 10/02/2019 1306   TRIG 154.0 (H) 10/02/2019 1306   HDL 49.00 10/02/2019 1306   CHOLHDL 5 10/02/2019 1306   VLDL 30.8 10/02/2019 1306   LDLCALC 154 (H) 10/02/2019 1306   LDLDIRECT 143 (H) 10/21/2009 1611    Physical Exam:    VS:  BP 122/70   Pulse 71   Ht 5\' 3"  (1.6 m)   Wt 162 lb (73.5 kg)   SpO2 97%   BMI 28.70 kg/m     Wt Readings from Last 3 Encounters:  10/04/19 162 lb (73.5 kg)  10/02/19 159 lb (72.1 kg)  08/07/19 165 lb 9.6 oz (75.1 kg)     GEN: Well nourished, well developed in no acute distress HEENT: Normal NECK: No JVD; No carotid bruits LYMPHATICS: No lymphadenopathy CARDIAC: S1S2 noted,RRR, no murmurs, rubs, gallops RESPIRATORY:  Clear to  auscultation without rales, wheezing or rhonchi  ABDOMEN: Soft, non-tender, non-distended, +bowel sounds, no guarding. EXTREMITIES: No edema, No cyanosis, no clubbing MUSCULOSKELETAL:  No deformity  SKIN: Warm and dry NEUROLOGIC:  Alert and oriented x 3, non-focal PSYCHIATRIC:  Normal affect, good insight  ASSESSMENT:    1. Precordial pain   2. Mixed hyperlipidemia   3. Abnormal EKG   4. Chest pain, unspecified type    PLAN:     Chest pain is concerning coupled with her abnormal EKG I am going to pursue an ischemic evaluation in this patient.  She has no contrast allergy.  Explained to her about the coronary CTA which I do believe is the appropriate test in this patient at this time.  She is agreeable to proceed with this testing.  I reviewed her lipid profile which was done by her PCP on October 02, 2019 LDL is 154, HDL 49, triglyceride 154, total cholesterol 233.  She is on Lipitor 10 mg daily, she is planning on working significantly on her diet.  To repeat this testing at her next visit.  Ideally her LDL should be less than 100 if this does not improve at that time I am going to increase her Lipitor.   The patient is in agreement with the above plan. The patient left the office in stable condition.  The patient will follow up in a month or sooner if needed.   Medication Adjustments/Labs and Tests Ordered: Current medicines are reviewed at length with the patient today.  Concerns regarding medicines are outlined above.  Orders Placed This Encounter  Procedures  . CT CORONARY MORPH W/CTA COR W/SCORE W/CA W/CM &/OR WO/CM  . CT CORONARY FRACTIONAL FLOW RESERVE DATA PREP  . CT CORONARY FRACTIONAL FLOW RESERVE FLUID ANALYSIS  . EKG 12-Lead   Meds ordered this encounter  Medications  . metoprolol tartrate (LOPRESSOR) 100 MG tablet    Sig: Take 1 tablet (100 mg total) by mouth once for 1 dose. Take 100 mg Metoprolol Tartrate 2 hours prior to your CT if your heart rate is greater than  55.    Dispense:  1 tablet    Refill:  0    Patient Instructions  Medication Instructions:   no medication changes *If you need a refill on your cardiac medications before your next appointment, please call your pharmacy*   Lab Work: None ordered If you have labs (blood work) drawn today and your tests are completely normal, you will receive your results only by: Marland Kitchen MyChart Message (if you have MyChart) OR . A paper copy in the mail If you have any lab test that is abnormal or we need to change your treatment, we will call you to review the results.   Testing/Procedures:  Your cardiac CT will be scheduled at:  [facility] [address] [place], [place] [address] [phone]   If scheduled at [facility], please arrive at the [facility] main entrance of [facility] 30 minutes prior to test start time. Proceed to the [facility] Radiology Department (first floor) to check-in and test prep.  Please follow these instructions carefully (unless otherwise directed):  On the Night Before the Test: . Be sure to Drink plenty of water. . Do not consume any caffeinated/decaffeinated beverages or chocolate 12 hours prior to your test. . Do not take any antihistamines 12 hours prior to your test.   On the Day of the Test: . Drink plenty of water. Do not drink any water within one hour of the test. . Do not eat any food 4 hours prior to the test. . You may take your regular medications prior to the test.  . Take metoprolol (Lopressor) two hours prior to test. . FEMALES- please wear underwire-free bra if available       After the Test: . Drink plenty of water. . After receiving IV contrast, you may experience a mild flushed feeling. This is normal. . On occasion, you may experience a mild rash up to 24 hours after the test. This is not dangerous. If this occurs, you can take Benadryl 25 mg and increase your fluid intake. . If you experience trouble  breathing, this can be serious. If it is severe call 911 IMMEDIATELY. If it is mild, please call our office. . If you take any of these medications: Glipizide/Metformin, Avandament, Glucavance, please do not take 48 hours after completing test unless otherwise instructed.   Once we have confirmed authorization from your insurance company, we will call you to set up a date and time for your test.   For non-scheduling related questions, please contact the cardiac imaging nurse navigator should you have any questions/concerns: Marchia Bond, RN Navigator Cardiac Imaging [facility] [phone] office  For scheduling needs, including cancellations and rescheduling, please call [phone].     Follow-Up: At [facility], you and your health needs are our priority.  As part of our continuing mission to provide you with exceptional heart care, we have created designated Provider Care Teams.  These Care Teams include your primary Cardiologist (physician) and Advanced Practice Providers (APPs -  Physician Assistants and Nurse Practitioners) who all work together to provide you with the care you need, when you need it.  We recommend signing up for the patient portal called "MyChart".  Sign up information is provided on this After Visit Summary.  MyChart is used to connect with patients for Virtual Visits (Telemedicine).  Patients are able to view lab/test results, encounter notes, upcoming appointments, etc.  Non-urgent messages can be sent to your provider as well.   To learn more about what you can do  with MyChart, go to [email or web address].    Your next appointment:   3 month(s)  The format for your next appointment:   In Person  Provider:   Berniece Salines, DO   Other Instructions NA     Adopting a Healthy Lifestyle.  Know what a healthy weight is for you (roughly BMI <25) and aim to maintain this   Aim for 7+ servings of fruits and vegetables daily     65-80+ fluid ounces of water or unsweet tea for healthy kidneys   Limit to max 1 drink of alcohol per day; avoid smoking/tobacco   Limit animal fats in diet for cholesterol and heart health - choose grass fed whenever available   Avoid highly processed foods, and foods high in saturated/trans fats   Aim for low stress - take time to unwind and care for your mental health   Aim for 150 min of moderate intensity exercise weekly for heart health, and weights twice weekly for bone health   Aim for 7-9 hours of sleep daily   When it comes to diets, agreement about the perfect plan isnt easy to find, even among the experts. Experts at the [employer] developed an idea known as the Healthy Eating Plate. Just imagine a plate divided into logical, healthy portions.   The emphasis is on diet quality:   Load up on vegetables and fruits - one-half of your plate: Aim for color and variety, and remember that potatoes dont count.   Go for whole grains - one-quarter of your plate: Whole wheat, barley, wheat berries, quinoa, oats, brown rice, and foods made with them. If you want pasta, go with whole wheat pasta.   Protein power - one-quarter of your plate: Fish, chicken, beans, and nuts are all healthy, versatile protein sources. Limit red meat.   The diet, however, does go beyond the plate, offering a few other suggestions.   Use healthy plant oils, such as olive, canola, soy, corn, sunflower and peanut. Check the labels, and avoid partially hydrogenated oil, which have unhealthy trans fats.   If youre thirsty, drink water. Coffee and tea are good in moderation, but skip sugary drinks and limit milk and dairy products to one or two daily servings.   The type of carbohydrate in the diet is more important than the amount. Some sources of carbohydrates, such as vegetables, fruits, whole grains, and beans-are healthier than others.   Finally, stay active  Signed, Berniece Salines, DO  10/04/2019 10:45 PM    [facility]

## 2019-10-04 NOTE — Patient Instructions (Signed)
Medication Instructions:   no medication changes *If you need a refill on your cardiac medications before your next appointment, please call your pharmacy*   Lab Work: None ordered If you have labs (blood work) drawn today and your tests are completely normal, you will receive your results only by: Marland Kitchen MyChart Message (if you have MyChart) OR . A paper copy in the mail If you have any lab test that is abnormal or we need to change your treatment, we will call you to review the results.   Testing/Procedures:  Your cardiac CT will be scheduled at:  [facility] [address] [place], [place] [address] [phone]   If scheduled at [facility], please arrive at the [facility] main entrance of [facility] 30 minutes prior to test start time. Proceed to the [facility] Radiology Department (first floor) to check-in and test prep.  Please follow these instructions carefully (unless otherwise directed):  On the Night Before the Test: . Be sure to Drink plenty of water. . Do not consume any caffeinated/decaffeinated beverages or chocolate 12 hours prior to your test. . Do not take any antihistamines 12 hours prior to your test.   On the Day of the Test: . Drink plenty of water. Do not drink any water within one hour of the test. . Do not eat any food 4 hours prior to the test. . You may take your regular medications prior to the test.  . Take metoprolol (Lopressor) two hours prior to test. . FEMALES- please wear underwire-free bra if available       After the Test: . Drink plenty of water. . After receiving IV contrast, you may experience a mild flushed feeling. This is normal. . On occasion, you may experience a mild rash up to 24 hours after the test. This is not dangerous. If this occurs, you can take Benadryl 25 mg and increase your fluid intake. . If you experience trouble breathing, this can be serious. If it is severe call 911 IMMEDIATELY. If  it is mild, please call our office. . If you take any of these medications: Glipizide/Metformin, Avandament, Glucavance, please do not take 48 hours after completing test unless otherwise instructed.   Once we have confirmed authorization from your insurance company, we will call you to set up a date and time for your test.   For non-scheduling related questions, please contact the cardiac imaging nurse navigator should you have any questions/concerns: Marchia Bond, RN Navigator Cardiac Imaging [facility] [phone] office  For scheduling needs, including cancellations and rescheduling, please call [phone].     Follow-Up: At [facility], you and your health needs are our priority.  As part of our continuing mission to provide you with exceptional heart care, we have created designated Provider Care Teams.  These Care Teams include your primary Cardiologist (physician) and Advanced Practice Providers (APPs -  Physician Assistants and Nurse Practitioners) who all work together to provide you with the care you need, when you need it.  We recommend signing up for the patient portal called "MyChart".  Sign up information is provided on this After Visit Summary.  MyChart is used to connect with patients for Virtual Visits (Telemedicine).  Patients are able to view lab/test results, encounter notes, upcoming appointments, etc.  Non-urgent messages can be sent to your provider as well.   To learn more about what you can do with MyChart, go to [email or web address].  Your next appointment:   3 month(s)  The format for your next appointment:   In Person  Provider:   Berniece Salines, DO   Other Instructions NA

## 2019-10-25 ENCOUNTER — Other Ambulatory Visit: Payer: Self-pay | Admitting: Family

## 2019-11-11 ENCOUNTER — Telehealth: Payer: Self-pay | Admitting: Cardiology

## 2019-11-11 ENCOUNTER — Other Ambulatory Visit: Payer: Self-pay | Admitting: Family Medicine

## 2019-11-11 ENCOUNTER — Encounter: Payer: Self-pay | Admitting: Family Medicine

## 2019-11-11 ENCOUNTER — Ambulatory Visit: Payer: Self-pay

## 2019-11-11 ENCOUNTER — Ambulatory Visit: Payer: BC Managed Care – PPO | Admitting: Family Medicine

## 2019-11-11 ENCOUNTER — Other Ambulatory Visit: Payer: Self-pay

## 2019-11-11 VITALS — BP 142/89 | HR 68 | Ht 63.0 in | Wt 160.0 lb

## 2019-11-11 DIAGNOSIS — M7552 Bursitis of left shoulder: Secondary | ICD-10-CM | POA: Diagnosis not present

## 2019-11-11 DIAGNOSIS — M25412 Effusion, left shoulder: Secondary | ICD-10-CM

## 2019-11-11 MED ORDER — METHYLPREDNISOLONE ACETATE 40 MG/ML IJ SUSP
40.0000 mg | Freq: Once | INTRAMUSCULAR | Status: AC
  Administered 2019-11-11: 40 mg via INTRA_ARTICULAR

## 2019-11-11 NOTE — Progress Notes (Signed)
Haley Fuentes - 59 y.o. female MRN [id number]  Date of birth: Mar 26, 1961  SUBJECTIVE:  Including CC & ROS.  Chief Complaint  Patient presents with  . Follow-up    left shoulder    Haley Fuentes is a 59 y.o. female that is presenting with worsening of the left shoulder pain.  She has received injections in the past which helped some.  The pain still seems to be ongoing.  Denies any specific inciting event or trauma.   Review of Systems See HPI   HISTORY: Past Medical, Surgical, Social, and Family History Reviewed & Updated per EMR.   Pertinent Historical Findings include:  Past Medical History:  Diagnosis Date  . Anemia    hx of iron deficient anemia  . Arthritis   . Blood transfusion without reported diagnosis   . Depression   . Gastric polyp   . GERD (gastroesophageal reflux disease)   . Hemorrhoids   . HLD (hyperlipidemia)   . Joint pain    left shoulder  . Kidney lesion    right  . Osteopenia 05/04/2017  . Postmenopausal     Past Surgical History:  Procedure Laterality Date  . ENDOMETRIAL ABLATION  2007   Novasure  . TUBAL LIGATION  1983    Family History  Problem Relation Age of Onset  . Diabetes Mother   . Heart disease Mother   . Breast cancer Mother 7  . Stroke Father   . Diabetes Father   . Heart disease Father        CABG, AVR, A-Fib  . Hepatitis Brother   . Alcohol abuse Brother   . Raynaud syndrome Sister   . Healthy Daughter   . Depression Son   . Anxiety disorder Son   . Healthy Son     Social History   Socioeconomic History  . Marital status: Married    Spouse name: Not on file  . Number of children: 2  . Years of education: Not on file  . Highest education level: Not on file  Occupational History  . 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 Smoking status: Former Smoker    Types: Cigarettes    Quit date: 07/04/1985    Years since quitting: 34.3  . Smokeless tobacco: Never Used  Substance and  Sexual Activity  . Alcohol use: Yes    Alcohol/week: 0.0 standard drinks    Comment: 4-5 times a week  . Drug use: No  . Sexual activity: Not on file  Other Topics Concern  . 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      Social Determinants of Health   Financial Resource Strain:   . Difficulty of Paying Living Expenses:   Food Insecurity:   . Worried About Charity fundraiser in the Last Year:   . Arboriculturist in the Last Year:   Transportation Needs:   . Film/video editor (Medical):   Marland Kitchen Lack of Transportation (Non-Medical):   Physical Activity:   . Days of Exercise per Week:   . Minutes of Exercise per Session:   Stress:   . Feeling of Stress :   Social Connections:   . Frequency of  Communication with Friends and Family:   . Frequency of Social Gatherings with Friends and Family:   . Attends Religious Services:   . Active Member of Clubs or Organizations:   . Attends Archivist Meetings:   Marland Kitchen Marital Status:   Intimate Partner Violence:   . Fear of Current or Ex-Partner:   . Emotionally Abused:   Marland Kitchen Physically Abused:   . Sexually Abused:      PHYSICAL EXAM:  VS: BP (!) 142/89   Pulse 68   Ht 5\' 3"  (1.6 m)   Wt 160 lb (72.6 kg)   BMI 28.34 kg/m  Physical Exam Gen: NAD, alert, cooperative with exam, well-appearing MSK:  Left shoulder: Normal internal rotation. Limited external rotation is mildly. Pain with external rotation and abduction. Pain with empty can test. Pain with O'Brien's test. Neurovascularly intact  Limited ultrasound: Left shoulder:  Encircling effusion of the biceps tendon apparent. Minor changes of the supraspinatus and mild subacromial bursitis Effusion noted in the posterior glenohumeral  joint.  Summary: Effusion of the joint and mild bursitis.  Ultrasound and interpretation by Clearance Coots, MD    Aspiration/Injection Procedure Note Mackena Rieb Apr 11, 1961  Procedure: Injection Indications: Left shoulder pain  Procedure Details Consent: Risks of procedure as well as the alternatives and risks of each were explained to the (patient/caregiver).  Consent for procedure obtained. Time Out: Verified patient identification, verified procedure, site/side was marked, verified correct patient position, special equipment/implants available, medications/allergies/relevent history reviewed, required imaging and test results available.  Performed.  The area was cleaned with iodine and alcohol swabs.    The left subacromial space was injected using 1 cc's of 40 mg Depo-Medrol and 4 cc's of 0.25% bupivacaine with a 22 1 1/2" needle.  Ultrasound was used. Images were obtained in long views showing the injection.     A sterile dressing was applied.  Patient did tolerate procedure well.    ASSESSMENT & PLAN:   Subacromial bursitis Mild on ultrasound.  Did get improvement with previous injection but pain did not entirely dissipate.  May have more than 1 component to the shoulder pain. -Injection. -Counseled on home exercise therapy and supportive care. -If no improvement would consider glenohumeral injection.  Effusion of joint of left shoulder Patient effusion in the joint that could be contributing to more her pain as well.  Possible for degenerative changes or degenerative labrum -Would consider x-ray. -Could consider glenohumeral injection or physical therapy. -Counseled on home exercise therapy and supportive care.

## 2019-11-11 NOTE — Telephone Encounter (Signed)
Patient cancel Cardiac Ct that was schedule for 11-18-19.  She can't afford the test.

## 2019-11-11 NOTE — Patient Instructions (Signed)
Good to see you Please try ice  Please try the exercises  Please send me a message in MyChart with any questions or updates.  Please see me back in 4 weeks.   --Dr. Wanette Robison  

## 2019-11-12 DIAGNOSIS — M25412 Effusion, left shoulder: Secondary | ICD-10-CM | POA: Insufficient documentation

## 2019-11-12 NOTE — Assessment & Plan Note (Signed)
Patient effusion in the joint that could be contributing to more her pain as well.  Possible for degenerative changes or degenerative labrum -Would consider x-ray. -Could consider glenohumeral injection or physical therapy. -Counseled on home exercise therapy and supportive care.

## 2019-11-12 NOTE — Assessment & Plan Note (Signed)
Mild on ultrasound.  Did get improvement with previous injection but pain did not entirely dissipate.  May have more than 1 component to the shoulder pain. -Injection. -Counseled on home exercise therapy and supportive care. -If no improvement would consider glenohumeral injection.

## 2019-11-14 ENCOUNTER — Telehealth: Payer: Self-pay | Admitting: Family Medicine

## 2019-11-14 MED ORDER — PENNSAID 2 % EX SOLN: 1.0000 "application " | Freq: Two times a day (BID) | CUTANEOUS | 3 refills | Status: DC

## 2019-11-14 NOTE — Telephone Encounter (Signed)
[place] calling on behalf of patient to request a refill of pennsaid

## 2019-11-18 ENCOUNTER — Ambulatory Visit (HOSPITAL_COMMUNITY): Payer: BC Managed Care – PPO

## 2019-11-19 ENCOUNTER — Other Ambulatory Visit: Payer: Self-pay | Admitting: Family

## 2019-11-20 ENCOUNTER — Encounter: Payer: Self-pay | Admitting: Cardiology

## 2019-11-27 ENCOUNTER — Encounter: Payer: Self-pay | Admitting: Cardiology

## 2019-12-04 ENCOUNTER — Telehealth (HOSPITAL_COMMUNITY): Payer: Self-pay | Admitting: *Deleted

## 2019-12-04 NOTE — Telephone Encounter (Signed)

## 2019-12-06 ENCOUNTER — Ambulatory Visit (HOSPITAL_COMMUNITY)
Admission: RE | Admit: 2019-12-06 | Discharge: 2019-12-06 | Disposition: A | Discharge status: Home / Self Care | Payer: BC Managed Care – PPO | Source: Ambulatory Visit | Attending: Cardiology | Admitting: Cardiology

## 2019-12-06 ENCOUNTER — Other Ambulatory Visit: Payer: Self-pay

## 2019-12-06 DIAGNOSIS — R072 Precordial pain: Secondary | ICD-10-CM | POA: Insufficient documentation

## 2019-12-06 DIAGNOSIS — R9431 Abnormal electrocardiogram [ECG] [EKG]: Secondary | ICD-10-CM | POA: Insufficient documentation

## 2019-12-06 DIAGNOSIS — R079 Chest pain, unspecified: Secondary | ICD-10-CM | POA: Insufficient documentation

## 2019-12-06 MED ORDER — IOHEXOL 350 MG/ML SOLN
80.0000 mL | Freq: Once | INTRAVENOUS | Status: AC | PRN
  Administered 2019-12-06: 80 mL via INTRAVENOUS

## 2019-12-06 MED ORDER — NITROGLYCERIN 0.4 MG SL SUBL
SUBLINGUAL_TABLET | SUBLINGUAL | Status: AC
  Filled 2019-12-06: qty 2

## 2019-12-06 MED ORDER — NITROGLYCERIN 0.4 MG SL SUBL: 0.8000 mg | SUBLINGUAL_TABLET | Freq: Once | SUBLINGUAL | Status: DC

## 2019-12-10 ENCOUNTER — Other Ambulatory Visit: Payer: Self-pay

## 2019-12-10 ENCOUNTER — Encounter: Payer: Self-pay | Admitting: Family Medicine

## 2019-12-10 ENCOUNTER — Ambulatory Visit: Payer: BC Managed Care – PPO | Admitting: Family Medicine

## 2019-12-10 DIAGNOSIS — M7552 Bursitis of left shoulder: Secondary | ICD-10-CM | POA: Diagnosis not present

## 2019-12-10 DIAGNOSIS — R072 Precordial pain: Secondary | ICD-10-CM | POA: Diagnosis not present

## 2019-12-10 DIAGNOSIS — R9431 Abnormal electrocardiogram [ECG] [EKG]: Secondary | ICD-10-CM | POA: Diagnosis not present

## 2019-12-10 NOTE — Assessment & Plan Note (Signed)
Has done well with a subacromial injection.  Is also doing home exercises. -Counseled on home exercise therapy and supportive care. -Could consider physical therapy or imaging if needed.

## 2019-12-10 NOTE — Progress Notes (Signed)
Haley Fuentes - 59 y.o. female MRN [id number]  Date of birth: 05-15-1961  SUBJECTIVE:  Including CC & ROS.  Chief Complaint  Patient presents with   Follow-up    left shoulder    Haley Fuentes is a 59 y.o. female that is following up for left shoulder pain.  She received a subacromial injection has had significant improvement of her pain.  She still notices a twinge from time to time.  Denies any radicular symptoms.  Has been doing the home exercises.   Review of Systems See HPI   HISTORY: Past Medical, Surgical, Social, and Family History Reviewed & Updated per EMR.   Pertinent Historical Findings include:  Past Medical History:  Diagnosis Date   Anemia    hx of iron deficient anemia   Arthritis    Blood transfusion without reported diagnosis    Depression    Gastric polyp    GERD (gastroesophageal reflux disease)    Hemorrhoids    HLD (hyperlipidemia)    Joint pain    left shoulder   Kidney lesion    right   Osteopenia 05/04/2017   Postmenopausal     Past Surgical History:  Procedure Laterality Date   ENDOMETRIAL ABLATION  2007   Novasure   TUBAL LIGATION  1983    Family History  Problem Relation Age of Onset   Diabetes Mother    Heart disease Mother    Breast cancer Mother 46   Stroke Father    Diabetes Father    Heart disease Father        CABG, AVR, A-Fib   Hepatitis Brother    Alcohol abuse Brother    Raynaud syndrome Sister    Healthy Daughter    Depression Son    Anxiety disorder Son    Healthy Son     Social History   Socioeconomic History   Marital status: Married    Spouse name: Not on file   Number of children: 2   Years of education: Not on file   Highest education level: Not on file  Occupational History   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[employer]    Employer: [employer]  Tobacco Use   Smoking status: Former Smoker    Types: Cigarettes    Quit date: 07/04/1985    Years since quitting: 34.4   Smokeless  tobacco: Never Used  Substance and Sexual Activity   Alcohol use: Yes    Alcohol/week: 0.0 standard drinks    Comment: 4-5 times a week   Drug use: No   Sexual activity: Not on file  Other Topics Concern   Not on file  Social History Narrative   Regular exercise: 30 minutes 5 days a week   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      Social Determinants of Health   Financial Resource Strain:    Difficulty of Paying Living Expenses:   Food Insecurity:    Worried About Charity fundraiser in the Last Year:    Arboriculturist in the Last Year:   Transportation Needs:    Film/video editor (Medical):    Lack of Transportation (Non-Medical):   Physical Activity:    Days of Exercise per Week:    Minutes of Exercise per Session:   Stress:    Feeling of Stress :   Social  Connections:    Frequency of Communication with Friends and Family:    Frequency of Social Gatherings with Friends and Family:    Attends Religious Services:    Active Member of Clubs or Organizations:    Attends Music therapist:    Marital Status:   Intimate Partner Violence:    Fear of Current or Ex-Partner:    Emotionally Abused:    Physically Abused:    Sexually Abused:      PHYSICAL EXAM:  VS: BP 126/85    Pulse (!) 56    Ht 5\' 4"  (1.626 m)    Wt 160 lb (72.6 kg)    BMI 27.46 kg/m  Physical Exam Gen: NAD, alert, cooperative with exam, well-appearing MSK:  Left shoulder: Normal internal and external rotation. Normal active flexion and abduction. Neurovascularly intact     ASSESSMENT & PLAN:   Subacromial bursitis Has done well with a subacromial injection.  Is also doing home exercises. -Counseled on home exercise therapy and supportive care. -Could consider physical therapy or imaging if needed.

## 2019-12-20 DIAGNOSIS — Z1231 Encounter for screening mammogram for malignant neoplasm of breast: Secondary | ICD-10-CM | POA: Diagnosis not present

## 2019-12-20 LAB — HM MAMMOGRAPHY

## 2020-01-22 ENCOUNTER — Ambulatory Visit: Payer: BC Managed Care – PPO | Admitting: Cardiology

## 2020-01-23 ENCOUNTER — Other Ambulatory Visit: Payer: Self-pay | Admitting: Family

## 2020-01-23 ENCOUNTER — Ambulatory Visit: Payer: BC Managed Care – PPO | Admitting: Cardiology

## 2020-01-24 ENCOUNTER — Other Ambulatory Visit: Payer: Self-pay

## 2020-01-24 ENCOUNTER — Encounter: Payer: Self-pay | Admitting: Cardiology

## 2020-01-24 ENCOUNTER — Ambulatory Visit: Payer: BC Managed Care – PPO | Admitting: Cardiology

## 2020-01-24 VITALS — BP 116/74 | HR 74 | Ht 64.0 in | Wt 163.1 lb

## 2020-01-24 DIAGNOSIS — E782 Mixed hyperlipidemia: Secondary | ICD-10-CM | POA: Diagnosis not present

## 2020-01-24 DIAGNOSIS — I2510 Atherosclerotic heart disease of native coronary artery without angina pectoris: Secondary | ICD-10-CM

## 2020-01-24 MED ORDER — ASPIRIN EC 81 MG PO TBEC: 81.0000 mg | DELAYED_RELEASE_TABLET | Freq: Every day | ORAL | 3 refills | Status: DC

## 2020-01-24 NOTE — Progress Notes (Signed)
Cardiology Office Note:    Date:  01/24/2020   ID:  Haley Fuentes, DOB 27-Dec-1960, MRN [id number]  PCP:  [name], NP  Cardiologist:  Berniece Salines, DO  Electrophysiologist:  None   Referring MD: [name], NP   " I am doing well"   History of Present Illness:    Haley Fuentes is a 59 y.o. female with a hx of hyperlipidemia, coronary artery disease by coronary CTA, presents today for follow-up visit.  Last saw the patient on October 04, 2019 at that time she complained of precordial chest pain given her family history I recommended she undergo coronary CTA. In the interim the patient will get this testing done.  She is here today to discuss her testing result.  Past Medical History:  Diagnosis Date  . Anemia    hx of iron deficient anemia  . Arthritis   . Blood transfusion without reported diagnosis   . Depression   . Gastric polyp   . GERD (gastroesophageal reflux disease)   . Hemorrhoids   . HLD (hyperlipidemia)   . Joint pain    left shoulder  . Kidney lesion    right  . Osteopenia 05/04/2017  . Postmenopausal     Past Surgical History:  Procedure Laterality Date  . ENDOMETRIAL ABLATION  2007   Novasure  . TUBAL LIGATION  1983    Current Medications: Current Meds  Medication Sig  . atorvastatin (LIPITOR) 10 MG tablet TAKE ONE TABLET BY MOUTH DAILY  . buPROPion (WELLBUTRIN XL) 150 MG 24 hr tablet Take 1 tablet (150 mg total) by mouth daily.  . cyclobenzaprine (FLEXERIL) 10 MG tablet TAKE ONE TABLET BY MOUTH TWICE A DAY AS NEEDED  . Diclofenac Sodium (PENNSAID) 2 % SOLN Place 1 application onto the skin 2 (two) times daily.  Marland Kitchen ESTRACE VAGINAL 0.1 MG/GM vaginal cream Place 1 application vaginally at bedtime.  Marland Kitchen estradiol (ESTRACE) 0.5 MG tablet Take 0.5 mg by mouth daily.    Marland Kitchen FLUoxetine (PROZAC) 40 MG capsule TAKE ONE CAPSULE BY MOUTH DAILY  . GERI-LANTA 200-200-20 MG/5ML suspension 2 (two) times daily. 2 squirts twice per day  . magic mouthwash  w/lidocaine SOLN 5 ml po qid swish and spit.  . medroxyPROGESTERone (PROVERA) 2.5 MG tablet Take 1 tablet by mouth daily.  . Multiple Vitamins-Minerals (MULTIVITAMIN WITH MINERALS) tablet 1 tablet.    . nitroGLYCERIN (NITRODUR - DOSED IN MG/24 HR) 0.4 mg/hr patch Place 0.4 mg onto the skin daily. Cut and apply 1/4 of the patch to the most painful area.  . pantoprazole (PROTONIX) 40 MG tablet Take 1 tablet (40 mg total) by mouth daily. Take 20-30 minutes prior to lunch meal  . zolpidem (AMBIEN) 10 MG tablet TAKE ONE-HALF TO ONE TABLET BY MOUTH AT BEDTIME AS NEEDED FOR SLEEP     Allergies:   Hydrocodone-acetaminophen, Hydrocodone-acetaminophen, Omni-pac, and Voltaren [diclofenac]   Social History   Socioeconomic History  . Marital status: Married    Spouse name: Not on file  . Number of children: 2  . Years of education: Not on file  . Highest education level: Not on file  Occupational History  . 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 Smoking status: Former Smoker    Types: Cigarettes    Quit date: 07/04/1985    Years since quitting: 34.5  . Smokeless tobacco: Never Used  Vaping Use  . Vaping Use: Never used  Substance and Sexual Activity  .  Alcohol use: Yes    Alcohol/week: 0.0 standard drinks    Comment: 4-5 times a week  . Drug use: No  . Sexual activity: Not on file  Other Topics Concern  . 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      Social Determinants of Health   Financial Resource Strain:   . Difficulty of Paying Living Expenses:   Food Insecurity:   . Worried About Charity fundraiser in the Last Year:   . Arboriculturist in the Last Year:   Transportation Needs:   . Film/video editor  (Medical):   Marland Kitchen Lack of Transportation (Non-Medical):   Physical Activity:   . Days of Exercise per Week:   . Minutes of Exercise per Session:   Stress:   . Feeling of Stress :   Social Connections:   . Frequency of Communication with Friends and Family:   . Frequency of Social Gatherings with Friends and Family:   . Attends Religious Services:   . Active Member of Clubs or Organizations:   . Attends Archivist Meetings:   Marland Kitchen Marital Status:      Family History: The patient's family history includes Alcohol abuse in her brother; Anxiety disorder in her son; Breast cancer (age of onset: 3) in her mother; Depression in her son; Diabetes in her father and mother; Healthy in her daughter and son; Heart disease in her father and mother; Hepatitis in her brother; Raynaud syndrome in her sister; Stroke in her father.  ROS:   Review of Systems  Constitution: Negative for decreased appetite, fever and weight gain.  HENT: Negative for congestion, ear discharge, hoarse voice and sore throat.   Eyes: Negative for discharge, redness, vision loss in right eye and visual halos.  Cardiovascular: Negative for chest pain, dyspnea on exertion, leg swelling, orthopnea and palpitations.  Respiratory: Negative for cough, hemoptysis, shortness of breath and snoring.   Endocrine: Negative for heat intolerance and polyphagia.  Hematologic/Lymphatic: Negative for bleeding problem. Does not bruise/bleed easily.  Skin: Negative for flushing, nail changes, rash and suspicious lesions.  Musculoskeletal: Negative for arthritis, joint pain, muscle cramps, myalgias, neck pain and stiffness.  Gastrointestinal: Negative for abdominal pain, bowel incontinence, diarrhea and excessive appetite.  Genitourinary: Negative for decreased libido, genital sores and incomplete emptying.  Neurological: Negative for brief paralysis, focal weakness, headaches and loss of balance.  Psychiatric/Behavioral: Negative for  altered mental status, depression and suicidal ideas.  Allergic/Immunologic: Negative for HIV exposure and persistent infections.    EKGs/Labs/Other Studies Reviewed:    The following studies were reviewed today:   EKG:  None today   CCTA IMPRESSION: 1. Coronary calcium score of 53. This was 87th percentile for age and sex matched control.  2.  Normal coronary origin with right dominance.  3.  Mild atherosclerosis of the distal LM and mid LAD.  CAD-RADS=1.  4.  Consider non-atherosclerotic causes of chest pain.  5.  Recommend preventive therapy and risk factor modification.  6.  This study has been submitted for FFR analysis.   Recent Labs: 10/02/2019: ALT 19; BUN 16; Creatinine, Ser 0.71; Hemoglobin 12.4; Platelets 234.0; Potassium 4.3; Sodium 139; TSH 1.57  Recent Lipid Panel    Component  Value Date/Time   CHOL 233 (H) 10/02/2019 1306   TRIG 154.0 (H) 10/02/2019 1306   HDL 49.00 10/02/2019 1306   CHOLHDL 5 10/02/2019 1306   VLDL 30.8 10/02/2019 1306   LDLCALC 154 (H) 10/02/2019 1306   LDLDIRECT 143 (H) 10/21/2009 1611    Physical Exam:    VS:  BP 116/74   Pulse 74   Ht 5\' 4"  (1.626 m)   Wt 163 lb 1.3 oz (74 kg)   SpO2 96%   BMI 27.99 kg/m     Wt Readings from Last 3 Encounters:  01/24/20 163 lb 1.3 oz (74 kg)  12/10/19 160 lb (72.6 kg)  11/11/19 160 lb (72.6 kg)     GEN: Well nourished, well developed in no acute distress HEENT: Normal NECK: No JVD; No carotid bruits LYMPHATICS: No lymphadenopathy CARDIAC: S1S2 noted,RRR, no murmurs, rubs, gallops RESPIRATORY:  Clear to auscultation without rales, wheezing or rhonchi  ABDOMEN: Soft, non-tender, non-distended, +bowel sounds, no guarding. EXTREMITIES: No edema, No cyanosis, no clubbing MUSCULOSKELETAL:  No deformity  SKIN: Warm and dry NEUROLOGIC:  Alert and oriented x 3, non-focal PSYCHIATRIC:  Normal affect, good insight  ASSESSMENT:    1. Mixed hyperlipidemia   2. Coronary artery  disease involving native coronary artery of native heart without angina pectoris    PLAN:     I did discuss with the patient about her new diagnosis of coronary artery disease.  She is already on atorvastatin 10 mg daily I like to increase this to moderate intensity dose statin but she states that she had been on this in the past and she got significant muscle weakness.  So the plan is we will have her repeat her lipid profile and should she be greater than 70 we will discuss starting a different type of statin medication or possibly adding Zetia.  She also will start aspirin 81 mg daily.  Hyperlipidemia her last lipid profile showed LDL of 154, HDL 49 total cholesterol 233 and triglycerides 154.  We will repeat her lipid profile.  The patient is in agreement with the above plan. The patient left the office in stable condition.  The patient will follow up in 1 year or sooner if needed.   Medication Adjustments/Labs and Tests Ordered: Current medicines are reviewed at length with the patient today.  Concerns regarding medicines are outlined above.  Orders Placed This Encounter  Procedures  . Lipid panel   Meds ordered this encounter  Medications  . aspirin EC 81 MG tablet    Sig: Take 1 tablet (81 mg total) by mouth daily. Swallow whole.    Dispense:  90 tablet    Refill:  3    Patient Instructions  Medication Instructions:  Your physician has recommended you make the following change in your medication:  START: Aspirin 81 mg take one tablet by mouth daily.   *If you need a refill on your cardiac medications before your next appointment, please call your pharmacy*   Lab Work: Your physician recommends that you return for lab work in: [place]   If you have labs (blood work) drawn today and your tests are completely normal, you will receive your results only by: Marland Kitchen MyChart Message (if you have MyChart) OR . A paper copy in the mail If you have any lab  test that is abnormal or we need to change your treatment, we will call you to review the results.   Testing/Procedures: None   Follow-Up:  At [facility], you and your health needs are our priority.  As part of our continuing mission to provide you with exceptional heart care, we have created designated Provider Care Teams.  These Care Teams include your primary Cardiologist (physician) and Advanced Practice Providers (APPs -  Physician Assistants and Nurse Practitioners) who all work together to provide you with the care you need, when you need it.  We recommend signing up for the patient portal called "MyChart".  Sign up information is provided on this After Visit Summary.  MyChart is used to connect with patients for Virtual Visits (Telemedicine).  Patients are able to view lab/test results, encounter notes, upcoming appointments, etc.  Non-urgent messages can be sent to your provider as well.   To learn more about what you can do with MyChart, go to [email or web address].    Your next appointment:   1 year(s)  The format for your next appointment:   In Person  Provider:   Berniece Salines, DO   Other Instructions      Adopting a Healthy Lifestyle.  Know what a healthy weight is for you (roughly BMI <25) and aim to maintain this   Aim for 7+ servings of fruits and vegetables daily   65-80+ fluid ounces of water or unsweet tea for healthy kidneys   Limit to max 1 drink of alcohol per day; avoid smoking/tobacco   Limit animal fats in diet for cholesterol and heart health - choose grass fed whenever available   Avoid highly processed foods, and foods high in saturated/trans fats   Aim for low stress - take time to unwind and care for your mental health   Aim for 150 min of moderate intensity exercise weekly for heart health, and weights twice weekly for bone health   Aim for 7-9 hours of sleep daily   When it comes to diets, agreement about the perfect plan isnt easy  to find, even among the experts. Experts at the [place] developed an idea known as the Healthy Eating Plate. Just imagine a plate divided into logical, healthy portions.   The emphasis is on diet quality:   Load up on vegetables and fruits - one-half of your plate: Aim for color and variety, and remember that potatoes dont count.   Go for whole grains - one-quarter of your plate: Whole wheat, barley, wheat berries, quinoa, oats, brown rice, and foods made with them. If you want pasta, go with whole wheat pasta.   Protein power - one-quarter of your plate: Fish, chicken, beans, and nuts are all healthy, versatile protein sources. Limit red meat.   The diet, however, does go beyond the plate, offering a few other suggestions.   Use healthy plant oils, such as olive, canola, soy, corn, sunflower and peanut. Check the labels, and avoid partially hydrogenated oil, which have unhealthy trans fats.   If youre thirsty, drink water. Coffee and tea are good in moderation, but skip sugary drinks and limit milk and dairy products to one or two daily servings.   The type of carbohydrate in the diet is more important than the amount. Some sources of carbohydrates, such as vegetables, fruits, whole grains, and beans-are healthier than others.   Finally, stay active  Signed, Berniece Salines, DO  01/24/2020 11:09 AM    [place]

## 2020-01-24 NOTE — Patient Instructions (Signed)
Medication Instructions:  Your physician has recommended you make the following change in your medication:  START: Aspirin 81 mg take one tablet by mouth daily.   *If you need a refill on your cardiac medications before your next appointment, please call your pharmacy*   Lab Work: Your physician recommends that you return for lab work in: [place]   If you have labs (blood work) drawn today and your tests are completely normal, you will receive your results only by: Marland Kitchen MyChart Message (if you have MyChart) OR . A paper copy in the mail If you have any lab test that is abnormal or we need to change your treatment, we will call you to review the results.   Testing/Procedures: None   Follow-Up: At [facility], you and your health needs are our priority.  As part of our continuing mission to provide you with exceptional heart care, we have created designated Provider Care Teams.  These Care Teams include your primary Cardiologist (physician) and Advanced Practice Providers (APPs -  Physician Assistants and Nurse Practitioners) who all work together to provide you with the care you need, when you need it.  We recommend signing up for the patient portal called "MyChart".  Sign up information is provided on this After Visit Summary.  MyChart is used to connect with patients for Virtual Visits (Telemedicine).  Patients are able to view lab/test results, encounter notes, upcoming appointments, etc.  Non-urgent messages can be sent to your provider as well.   To learn more about what you can do with MyChart, go to [email or web address].    Your next appointment:   1 year(s)  The format for your next appointment:   In Person  Provider:   Berniece Salines, DO   Other Instructions

## 2020-01-28 ENCOUNTER — Other Ambulatory Visit: Payer: BC Managed Care – PPO

## 2020-01-28 ENCOUNTER — Other Ambulatory Visit: Payer: Self-pay

## 2020-01-30 ENCOUNTER — Telehealth: Payer: Self-pay

## 2020-01-30 LAB — LIPID PANEL
Chol/HDL Ratio: 4.4 ratio (ref 0.0–4.4)
Cholesterol, Total: 264 mg/dL — ABNORMAL HIGH (ref 100–199)
HDL: 60 mg/dL (ref >39)
LDL Chol Calc (NIH): 177 mg/dL — ABNORMAL HIGH (ref 0–99)
Triglycerides: 151 mg/dL — ABNORMAL HIGH (ref 0–149)
VLDL Cholesterol Cal: 27 mg/dL (ref 5–40)

## 2020-01-30 NOTE — Telephone Encounter (Signed)
-----   Message from Berniece Salines, DO sent at 01/30/2020  9:22 AM EDT -----   Repeat LDL 177 on atorvastatin 10 mg daily.  I will like to increase her atorvastatin to 20 mg daily  Total cholesterol was 264( should be 200 less), triglycerides 151 just slightly higher.  HDL is good at 60

## 2020-01-30 NOTE — Telephone Encounter (Signed)
Left message on patients voicemail to please return our call.   

## 2020-01-31 ENCOUNTER — Telehealth: Payer: Self-pay

## 2020-01-31 MED ORDER — ATORVASTATIN CALCIUM 20 MG PO TABS: 20.0000 mg | ORAL_TABLET | Freq: Every day | ORAL | 3 refills | Status: DC

## 2020-01-31 NOTE — Telephone Encounter (Signed)
Spoke with patient regarding results and recommendation.  Patient verbalizes understanding and is agreeable to plan of care. Advised patient to call back with any issues or concerns.  

## 2020-01-31 NOTE — Telephone Encounter (Signed)
-----   Message from Berniece Salines, DO sent at 01/30/2020  9:22 AM EDT -----   Repeat LDL 177 on atorvastatin 10 mg daily.  I will like to increase her atorvastatin to 20 mg daily  Total cholesterol was 264( should be 200 less), triglycerides 151 just slightly higher.  HDL is good at 60

## 2020-02-21 ENCOUNTER — Other Ambulatory Visit: Payer: Self-pay | Admitting: Family Medicine

## 2020-02-25 ENCOUNTER — Other Ambulatory Visit: Payer: Self-pay | Admitting: Family

## 2020-02-26 ENCOUNTER — Telehealth: Payer: Self-pay | Admitting: Family Medicine

## 2020-02-26 NOTE — Telephone Encounter (Signed)
Gave verbal order to refill pennsaid.   Rosemarie Ax, MD [facility] 02/26/2020, 11:30 AM

## 2020-02-26 NOTE — Telephone Encounter (Signed)
[facility] pharmacy rep Garth Schlatter called to say  they need provider to call them regarding this Rx  Diclofenac Sodium (PENNSAID) 2 % SOLN [[id number]]   Order Details Dose: 1 application Route: Transdermal Frequency: 2 times daily  Dispense Quantity: 112 g Refills: 3       Sig: Place 1 application onto the skin 2 (two) times daily.      Start Date: 11/14/19 End Date: --    Pharmacy info:   [id number] [place], [place] - [address], [place] [address]  Phone:  [phone] Fax:  [phone]   --Forwarding message to provider.  -glh

## 2020-03-15 ENCOUNTER — Other Ambulatory Visit: Payer: Self-pay | Admitting: Family

## 2020-03-16 ENCOUNTER — Encounter: Payer: Self-pay | Admitting: Family

## 2020-03-21 ENCOUNTER — Other Ambulatory Visit: Payer: Self-pay | Admitting: Family

## 2020-04-06 ENCOUNTER — Ambulatory Visit (INDEPENDENT_AMBULATORY_CARE_PROVIDER_SITE_OTHER): Payer: BC Managed Care – PPO | Admitting: Family

## 2020-04-06 ENCOUNTER — Other Ambulatory Visit: Payer: Self-pay

## 2020-04-06 VITALS — BP 116/73 | HR 64 | Temp 98.3°F | Resp 16 | Ht 63.0 in | Wt 165.0 lb

## 2020-04-06 DIAGNOSIS — E785 Hyperlipidemia, unspecified: Secondary | ICD-10-CM

## 2020-04-06 DIAGNOSIS — F419 Anxiety disorder, unspecified: Secondary | ICD-10-CM

## 2020-04-06 DIAGNOSIS — Z23 Encounter for immunization: Secondary | ICD-10-CM | POA: Diagnosis not present

## 2020-04-06 DIAGNOSIS — R0789 Other chest pain: Secondary | ICD-10-CM | POA: Diagnosis not present

## 2020-04-06 DIAGNOSIS — E1169 Type 2 diabetes mellitus with other specified complication: Secondary | ICD-10-CM

## 2020-04-06 DIAGNOSIS — F32A Depression, unspecified: Secondary | ICD-10-CM

## 2020-04-06 MED ORDER — PANTOPRAZOLE SODIUM 40 MG PO TBEC: 40.0000 mg | DELAYED_RELEASE_TABLET | Freq: Two times a day (BID) | ORAL | 0 refills | Status: DC

## 2020-04-06 MED ORDER — BUPROPION HCL ER (XL) 300 MG PO TB24: 300.0000 mg | ORAL_TABLET | Freq: Every day | ORAL | 0 refills | Status: DC

## 2020-04-06 NOTE — Progress Notes (Signed)
Subjective:    Patient ID: Haley Fuentes, female    DOB: 09-15-1960, 59 y.o.   MRN: [id number]  HPI  Patient is a 59 yr old female who presents today for follow up.  Depression/anxiety- maintained on fluoxetine 40mg  and wellbutrin XL 150mg .  Reports a lot of stress. Has some trouble falling asleep. Only taking ambien "when I have to."  Feels more anxious.  Has a lot of stress at work and her husband is not leaving the house due to covid which places a burden on her.   C/o intermittent chest pain which she attributes to anxiety- the patient has seen cardiology for this (last visit was 01/24/20).  She had a cardiac CT performed on 12/06/19 which was normal.   GERD-reports last night she had worsening reflux symptoms when she laid down.  Had feeling "like something was stuck" in her esophagus.  Still having some discomfort this AM.   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    Employer: [employer]  Tobacco Use   Smoking status: Former Smoker    Types: Cigarettes    Quit date: 07/04/1985    Years since quitting: 34.7   Smokeless tobacco: Never Used  Vaping Use   Vaping Use: Never used  Substance and Sexual Activity   Alcohol use: Yes    Alcohol/week: 0.0 standard drinks    Comment: 4-5 times a week   Drug use: No   Sexual activity: Not on file  Other Topics  Concern   Not on file  Social History Narrative   Regular exercise: 30 minutes 5 days a week   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      Social Determinants of Health   Financial Resource Strain:    Difficulty of Paying Living Expenses: Not on file  Food Insecurity:    Worried About Charity fundraiser in the Last Year: Not on file   [employer] of Food in the Last Year: Not on file  Transportation Needs:    Lack of Transportation (Medical): Not on file   Lack of Transportation (Non-Medical): Not on file  Physical Activity:    Days of Exercise per Week: Not on file   Minutes of Exercise per Session: Not on file  Stress:    Feeling of Stress : Not on file  Social Connections:    Frequency of Communication with Friends and Family: Not on file   Frequency of Social Gatherings with Friends and Family: Not on file   Attends Religious Services: Not on file  Active Member of Clubs or Organizations: Not on file   Attends Archivist Meetings: Not on file   Marital Status: Not on file  Intimate Partner Violence:    Fear of Current or Ex-Partner: Not on file   Emotionally Abused: Not on file   Physically Abused: Not on file   Sexually Abused: Not on file    Past Surgical History:  Procedure Laterality Date   ENDOMETRIAL ABLATION  2007   Novasure   TUBAL LIGATION  1983    Family History  Problem Relation Age of Onset   Diabetes Mother    Heart disease Mother    Breast cancer Mother 23   Stroke Father    Diabetes Father    Heart disease Father        CABG, AVR, A-Fib   Hepatitis Brother    Alcohol abuse Brother    Raynaud syndrome Sister    Healthy Daughter    Depression Son    Anxiety disorder Son    Healthy Son     Allergies  Allergen Reactions   Hydrocodone-Acetaminophen      "made her skin crawl"   Hydrocodone-Acetaminophen Itching   Omni-Pac     vomitting   Voltaren [Diclofenac]     diarrhea    Current Outpatient Medications on File Prior to Visit  Medication Sig Dispense Refill   aspirin EC 81 MG tablet Take 1 tablet (81 mg total) by mouth daily. Swallow whole. 90 tablet 3   atorvastatin (LIPITOR) 20 MG tablet Take 1 tablet (20 mg total) by mouth daily. 90 tablet 3   cyclobenzaprine (FLEXERIL) 10 MG tablet TAKE ONE TABLET BY MOUTH TWICE A DAY AS NEEDED 90 tablet 0   Diclofenac Sodium (PENNSAID) 2 % SOLN Place 1 application onto the skin 2 (two) times daily. 112 g 3   ESTRACE VAGINAL 0.1 MG/GM vaginal cream Place 1 application vaginally at bedtime.  1   estradiol (ESTRACE) 0.5 MG tablet Take 0.5 mg by mouth daily.       FLUoxetine (PROZAC) 40 MG capsule TAKE ONE CAPSULE BY MOUTH DAILY 90 capsule 1   GERI-LANTA 200-200-20 MG/5ML suspension 2 (two) times daily. 2 squirts twice per day     magic mouthwash w/lidocaine SOLN 5 ml po qid swish and spit. 200 mL 0   medroxyPROGESTERone (PROVERA) 2.5 MG tablet Take 1 tablet by mouth daily.     Multiple Vitamins-Minerals (MULTIVITAMIN WITH MINERALS) tablet 1 tablet.       nitroGLYCERIN (NITRODUR - DOSED IN MG/24 HR) 0.4 mg/hr patch Place 0.4 mg onto the skin daily. Cut and apply 1/4 of the patch to the most painful area.     zolpidem (AMBIEN) 10 MG tablet TAKE ONE-HALF TO ONE TABLET BY MOUTH AT BEDTIME AS NEEDED FOR SLEEP 15 tablet 0   [DISCONTINUED] cyclobenzaprine (FLEXERIL) 10 MG tablet TAKE ONE TABLET BY MOUTH TWICE A DAY AS NEEDED 90 tablet 0   No current facility-administered medications on file prior to visit.    BP 116/73 (BP Location: Right Arm, Patient Position: Sitting, Cuff Size: Small)    Pulse 64    Temp 98.3 F (36.8 C) (Oral)    Resp 16    Ht 5\' 3"  (1.6 m)    Wt 165 lb (74.8 kg)    SpO2 99%    BMI 29.23 kg/m    Objective:   Physical Exam Constitutional:      Appearance: She is  well-developed.  Neck:  Thyroid: No thyromegaly.  Cardiovascular:     Rate and Rhythm: Normal rate and regular rhythm.     Heart sounds: Normal heart sounds. No murmur heard.   Pulmonary:     Effort: Pulmonary effort is normal. No respiratory distress.     Breath sounds: Normal breath sounds. No wheezing.  Musculoskeletal:     Cervical back: Neck supple.  Skin:    General: Skin is warm and dry.  Neurological:     Mental Status: She is alert and oriented to person, place, and time.  Psychiatric:        Behavior: Behavior normal.        Thought Content: Thought content normal.        Judgment: Judgment normal.           Assessment & Plan:   Depression/anxiety- uncontrolled. Will increase wellbutrin xl from 150mg  to 300mg  once daily. Discussed counseling- pt declines due to time commitment. Follow up in 6 weeks.    Hyperlipidemia- on atorvastatin 20mg .  Check follow up lipid panel. Lab Results  Component Value Date   CHOL 264 (H) 01/29/2020   HDL 60 01/29/2020   LDLCALC 177 (H) 01/29/2020   LDLDIRECT 143 (H) 10/21/2009   TRIG 151 (H) 01/29/2020   CHOLHDL 4.4 01/29/2020   GERD- uncontrolled. Will increase protonix from once daily to bid x 2 weeks then return to once daily.  Atypical chest pain- has had negative cardiac work up. I think her pain is due to anxiety and gerd symptoms.  Recommendations as above.  Flu shot today.   This visit occurred during the SARS-CoV-2 public health emergency.  Safety protocols were in place, including screening questions prior to the visit, additional usage of staff PPE, and extensive cleaning of exam room while observing appropriate contact time as indicated for disinfecting solutions.

## 2020-04-06 NOTE — Patient Instructions (Signed)
Please increase protonix twice daily for 2 weeks, then you can return to once daily dosing. Increase wellbutrin XL to 300mg  once daily.  Try to get regular exercise.

## 2020-04-07 ENCOUNTER — Other Ambulatory Visit: Payer: Self-pay | Admitting: Family

## 2020-04-07 ENCOUNTER — Telehealth: Payer: Self-pay | Admitting: Family

## 2020-04-07 LAB — LIPID PANEL
Cholesterol: 223 mg/dL — ABNORMAL HIGH (ref <200)
HDL: 54 mg/dL (ref >=50)
LDL Cholesterol (Calc): 139 mg/dL (calc) — ABNORMAL HIGH
Non-HDL Cholesterol (Calc): 169 mg/dL (calc) — ABNORMAL HIGH (ref <130)
Total CHOL/HDL Ratio: 4.1 (calc) (ref <5.0)
Triglycerides: 164 mg/dL — ABNORMAL HIGH (ref <150)

## 2020-04-07 MED ORDER — ATORVASTATIN CALCIUM 20 MG PO TABS: 40.0000 mg | ORAL_TABLET | Freq: Every day | ORAL | 1 refills | Status: DC

## 2020-04-07 NOTE — Telephone Encounter (Signed)
See mychart.  

## 2020-04-22 ENCOUNTER — Other Ambulatory Visit: Payer: Self-pay | Admitting: Family

## 2020-05-18 ENCOUNTER — Other Ambulatory Visit: Payer: Self-pay

## 2020-05-18 ENCOUNTER — Encounter: Payer: Self-pay | Admitting: Family

## 2020-05-18 ENCOUNTER — Ambulatory Visit (INDEPENDENT_AMBULATORY_CARE_PROVIDER_SITE_OTHER): Payer: BC Managed Care – PPO | Admitting: Family

## 2020-05-18 VITALS — BP 122/77 | HR 69 | Temp 98.1°F | Resp 16 | Ht 63.0 in | Wt 165.0 lb

## 2020-05-18 DIAGNOSIS — F32A Depression, unspecified: Secondary | ICD-10-CM

## 2020-05-18 DIAGNOSIS — F419 Anxiety disorder, unspecified: Secondary | ICD-10-CM | POA: Diagnosis not present

## 2020-05-18 DIAGNOSIS — R0789 Other chest pain: Secondary | ICD-10-CM | POA: Diagnosis not present

## 2020-05-18 DIAGNOSIS — E1169 Type 2 diabetes mellitus with other specified complication: Secondary | ICD-10-CM | POA: Diagnosis not present

## 2020-05-18 DIAGNOSIS — E785 Hyperlipidemia, unspecified: Secondary | ICD-10-CM

## 2020-05-18 DIAGNOSIS — K219 Gastro-esophageal reflux disease without esophagitis: Secondary | ICD-10-CM

## 2020-05-18 LAB — LIPID PANEL
Cholesterol: 269 mg/dL — ABNORMAL HIGH (ref 0–200)
HDL: 47.5 mg/dL (ref >39.00)
NonHDL: 221.77
Total CHOL/HDL Ratio: 6
Triglycerides: 339 mg/dL — ABNORMAL HIGH (ref 0.0–149.0)
VLDL: 67.8 mg/dL — ABNORMAL HIGH (ref 0.0–40.0)

## 2020-05-18 LAB — LDL CHOLESTEROL, DIRECT: Direct LDL: 134 mg/dL

## 2020-05-18 NOTE — Progress Notes (Signed)
Subjective:    Patient ID: Haley Fuentes, female    DOB: October 16, 1960, 59 y.o.   MRN: [id number]  HPI  Patient is a 59 yr old female who presents today for follow up.  Anxiety/depression- last visit we increased her wellbutrin from 150mg  to 300mg . Reports anxiety is better.  Less anxious.  Sleep is "OK."   GERD- Last visit we increased protonix to bid x 2 weeks.  Reports that this improved her gerd symptoms. Less globus sensation.    Atypical chest pain- unchanged.  Had non-obstructive CAD on Cardiac CT performed by cardiology.   Hyperlipidemia- maintained on atorvastatin 20mg . Atorvastatin was 40mg .   Lab Results  Component Value Date   CHOL 223 (H) 04/06/2020   HDL 54 04/06/2020   [place] 139 (H) 04/06/2020   LDLDIRECT 143 (H) 10/21/2009   TRIG 164 (H) 04/06/2020   CHOLHDL 4.1 04/06/2020        Review of Systems See HPI  Past Medical History:  Diagnosis Date   Anemia    hx of iron deficient anemia   Arthritis    Blood transfusion without reported diagnosis    Depression    Gastric polyp    GERD (gastroesophageal reflux disease)    Hemorrhoids    HLD (hyperlipidemia)    Joint pain    left shoulder   Kidney lesion    right   Osteopenia 05/04/2017   Postmenopausal      Social History   Socioeconomic History   Marital status: Married    Spouse name: Not on file   Number of children: 2   Years of education: Not on file   Highest education level: Not on file  Occupational History   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    Employer: [employer]  Tobacco Use   Smoking status: Former Smoker    Types: Cigarettes    Quit date: 07/04/1985    Years since quitting: 34.8   Smokeless tobacco: Never Used  Vaping Use   Vaping Use: Never used  Substance and Sexual Activity   Alcohol use: Yes    Alcohol/week: 0.0 standard drinks    Comment: 4-5 times a week   Drug use: No   Sexual activity: Not on file  Other Topics Concern   Not on file    Social History Narrative   Regular exercise: 30 minutes 5 days a week   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      Social Determinants of Health   Financial Resource Strain:    Difficulty of Paying Living Expenses: Not on file  Food Insecurity:    Worried About Charity fundraiser in the Last Year: Not on file   [employer] of Food in the Last Year: Not on file  Transportation Needs:    Lack of Transportation (Medical): Not on file   Lack of Transportation (Non-Medical): Not on file  Physical Activity:    Days of Exercise per Week: Not on file   Minutes of Exercise per Session: Not on file  Stress:    Feeling of Stress : Not on file  Social Connections:    Frequency of Communication with Friends and Family: Not on file   Frequency of Social Gatherings with Friends and Family: Not on file   Attends Religious Services: Not on file   Active Member of  Clubs or Organizations: Not on file   Attends Archivist Meetings: Not on file   Marital Status: Not on file  Intimate Partner Violence:    Fear of Current or Ex-Partner: Not on file   Emotionally Abused: Not on file   Physically Abused: Not on file   Sexually Abused: Not on file    Past Surgical History:  Procedure Laterality Date   ENDOMETRIAL ABLATION  2007   Novasure   TUBAL LIGATION  1983    Family History  Problem Relation Age of Onset   Diabetes Mother    Heart disease Mother    Breast cancer Mother 72   Stroke Father    Diabetes Father    Heart disease Father        CABG, AVR, A-Fib   Hepatitis Brother    Alcohol abuse Brother    Raynaud syndrome Sister    Healthy Daughter    Depression Son    Anxiety disorder Son    Healthy Son     Allergies  Allergen Reactions   Hydrocodone-Acetaminophen     "made her skin crawl"    Hydrocodone-Acetaminophen Itching   Omni-Pac     vomitting   Voltaren [Diclofenac]     diarrhea    Current Outpatient Medications on File Prior to Visit  Medication Sig Dispense Refill   aspirin EC 81 MG tablet Take 1 tablet (81 mg total) by mouth daily. Swallow whole. 90 tablet 3   atorvastatin (LIPITOR) 20 MG tablet Take 2 tablets (40 mg total) by mouth daily. 180 tablet 1   buPROPion (WELLBUTRIN XL) 300 MG 24 hr tablet Take 1 tablet (300 mg total) by mouth daily. 90 tablet 0   cyclobenzaprine (FLEXERIL) 10 MG tablet TAKE ONE TABLET BY MOUTH TWICE A DAY AS NEEDED 90 tablet 0   Diclofenac Sodium (PENNSAID) 2 % SOLN Place 1 application onto the skin 2 (two) times daily. 112 g 3   ESTRACE VAGINAL 0.1 MG/GM vaginal cream Place 1 application vaginally at bedtime.  1   estradiol (ESTRACE) 0.5 MG tablet Take 0.5 mg by mouth daily.       FLUoxetine (PROZAC) 40 MG capsule TAKE ONE CAPSULE BY MOUTH DAILY 90 capsule 1   GERI-LANTA 200-200-20 MG/5ML suspension 2 (two) times daily. 2 squirts twice per day     magic mouthwash w/lidocaine SOLN 5 ml po qid swish and spit. 200 mL 0   medroxyPROGESTERone (PROVERA) 2.5 MG tablet Take 1 tablet by mouth daily.     Multiple Vitamins-Minerals (MULTIVITAMIN WITH MINERALS) tablet 1 tablet.       nitroGLYCERIN (NITRODUR - DOSED IN MG/24 HR) 0.4 mg/hr patch Place 0.4 mg onto the skin daily. Cut and apply 1/4 of the patch to the most painful area.     pantoprazole (PROTONIX) 40 MG tablet Take 1 tablet (40 mg total) by mouth 2 (two) times daily. 180 tablet 0   zolpidem (AMBIEN) 10 MG tablet TAKE ONE-HALF TO ONE TABLET BY MOUTH AT BEDTIME AS NEEDED FOR SLEEP 15 tablet 0   [DISCONTINUED] cyclobenzaprine (FLEXERIL) 10 MG tablet TAKE ONE TABLET BY MOUTH TWICE A DAY AS NEEDED 90 tablet 0   No current facility-administered medications on file prior to visit.    BP 122/77 (BP Location: Right Arm, Patient Position: Sitting, Cuff Size: Small)    Pulse  69    Temp 98.1 F (36.7 C) (Oral)    Resp 16    Ht 5\' 3"  (1.6 m)  Wt 165 lb (74.8 kg)    SpO2 100%    BMI 29.23 kg/m       Objective:   Physical Exam Constitutional:      Appearance: She is well-developed.  Neck:     Thyroid: No thyromegaly.  Cardiovascular:     Rate and Rhythm: Normal rate and regular rhythm.     Heart sounds: Normal heart sounds. No murmur heard.   Pulmonary:     Effort: Pulmonary effort is normal. No respiratory distress.     Breath sounds: Normal breath sounds. No wheezing.  Musculoskeletal:     Cervical back: Neck supple.  Skin:    General: Skin is warm and dry.  Neurological:     Mental Status: She is alert and oriented to person, place, and time.  Psychiatric:        Behavior: Behavior normal.        Thought Content: Thought content normal.        Judgment: Judgment normal.           Assessment & Plan:  Hyperlipidemia- obtain follow up lipid panel. Continue atorvastatin 20mg .  Atypical chest pain- unchanged. Continue primary prevention of CAD.  GERD- symptoms improved. She is back to once daily dosing of protonix. Continue same.  Anxiety/Depression- improved with increased dose of wellbutrin (300mg ). Continue same.   This visit occurred during the SARS-CoV-2 public health emergency.  Safety protocols were in place, including screening questions prior to the visit, additional usage of staff PPE, and extensive cleaning of exam room while observing appropriate contact time as indicated for disinfecting solutions.

## 2020-05-27 DIAGNOSIS — R197 Diarrhea, unspecified: Secondary | ICD-10-CM | POA: Diagnosis not present

## 2020-05-27 DIAGNOSIS — E785 Hyperlipidemia, unspecified: Secondary | ICD-10-CM | POA: Diagnosis not present

## 2020-05-27 DIAGNOSIS — K219 Gastro-esophageal reflux disease without esophagitis: Secondary | ICD-10-CM | POA: Diagnosis not present

## 2020-05-27 DIAGNOSIS — J209 Acute bronchitis, unspecified: Secondary | ICD-10-CM | POA: Diagnosis not present

## 2020-05-27 DIAGNOSIS — Z20822 Contact with and (suspected) exposure to covid-19: Secondary | ICD-10-CM | POA: Diagnosis not present

## 2020-05-27 DIAGNOSIS — F419 Anxiety disorder, unspecified: Secondary | ICD-10-CM | POA: Diagnosis not present

## 2020-06-02 ENCOUNTER — Encounter: Payer: Self-pay | Admitting: Family

## 2020-06-03 ENCOUNTER — Telehealth: Payer: BC Managed Care – PPO | Admitting: Family

## 2020-06-03 DIAGNOSIS — R059 Cough, unspecified: Secondary | ICD-10-CM

## 2020-06-03 NOTE — Progress Notes (Signed)
Based on what you shared with me, I feel your condition warrants further evaluation and I recommend that you be seen for a face to face office visit.  Hello, This is separate from your PCP office. You need to call the office to set your appointment up.    NOTE: If you entered your credit card information for this eVisit, you will not be charged. You may see a "hold" on your card for the $35 but that hold will drop off and you will not have a charge processed.   If you are having a true medical emergency please call 911.      For an urgent face to face visit, [place] [facility] has five urgent care centers for your convenience:     [facility] [place] [facility] [place] Get Driving Directions [phone] [address] [place], [place] [address] . 10 am - 6pm Monday - Friday    [facility]) Get Driving Directions [phone] [address] [place], [place] [address] . 10 am to 8 pm Monday-Friday . 12 pm to 8 pm [facility] at [facility] Get Driving Directions [phone] [place], [place], [place] [address] . 8 am to 8 pm Monday-Friday . 9 am to 6 pm Saturday . 11 am to 6 pm Sunday     [facility] at [facility] Get Driving Directions  [phone] [address] [place], [place] [address] . 8 am to 8 pm Monday-Friday . 8 am to 4 pm [facility] at [place] Get Driving Directions [phone] [address] [place], [place] [address] . 12 pm to 6 pm Monday-Friday      Your e-visit answers were reviewed by a board certified advanced clinical practitioner to complete your personal care plan.  Thank you for using e-Visits.

## 2020-06-05 ENCOUNTER — Ambulatory Visit (HOSPITAL_BASED_OUTPATIENT_CLINIC_OR_DEPARTMENT_OTHER)
Admission: RE | Admit: 2020-06-05 | Discharge: 2020-06-05 | Disposition: A | Discharge status: Home / Self Care | Payer: BC Managed Care – PPO | Source: Ambulatory Visit | Attending: Family | Admitting: Family

## 2020-06-05 ENCOUNTER — Telehealth (INDEPENDENT_AMBULATORY_CARE_PROVIDER_SITE_OTHER): Payer: BC Managed Care – PPO | Admitting: Family

## 2020-06-05 ENCOUNTER — Other Ambulatory Visit: Payer: Self-pay

## 2020-06-05 VITALS — Temp 98.6°F | Ht 63.0 in | Wt 160.0 lb

## 2020-06-05 DIAGNOSIS — R059 Cough, unspecified: Secondary | ICD-10-CM | POA: Diagnosis not present

## 2020-06-05 DIAGNOSIS — J40 Bronchitis, not specified as acute or chronic: Secondary | ICD-10-CM

## 2020-06-05 MED ORDER — CHERATUSSIN AC 100-10 MG/5ML PO SOLN
5.0000 mL | Freq: Three times a day (TID) | ORAL | 0 refills | Status: DC | PRN
Start: 2020-06-05 — End: 2020-06-11

## 2020-06-05 NOTE — Progress Notes (Signed)
Virtual Visit via Video Note  I connected with Haley Fuentes on 06/05/20 at  8:00 AM EST by a video enabled telemedicine application and verified that I am speaking with the correct person using two identifiers.  Location: Patient: work Provider: home   I discussed the limitations of evaluation and management by telemedicine and the availability of in person appointments. The patient expressed understanding and agreed to proceed. Only the patient and myself were present for today's video call.   History of Present Illness:  Patient is a 59 yr old female who presents today with chief complaint of cough.  Reports that she started coughing really badly on the 19th or the 20th. Cough was "deep and dry and was worse at night". She put a humidifier in her bedroom.  On Tuesday the 23rd she had a rapid covid test that was negative. She then had a pcr covid test on 11/24 which was also negative. Went to urgent care- told bronchitis. Was treated with zpak and prednisone 40mg  once daily for 5 days. She states that she completed on 11/29. Reports that she is still having some cough.  She is using tussin DM during the day nyquil at night.   Observations/Objective:   Gen: Awake, alert, no acute distress Resp: Breathing is even and non-labored Psych: calm/pleasant demeanor Neuro: Alert and Oriented x 3, + facial symmetry, speech is clear.   Assessment and Plan:  Bronchitis- I would like to rule out pneumonia.  She will complete a chest x-ray today. If CXR is clear, then we will plan to continue symptom management.  I sent an rx for cheratussin to help with cough.  She understands that this may cause drowsiness and not to take during the day or if driving.    Follow Up Instructions:    I discussed the assessment and treatment plan with the patient. The patient was provided an opportunity to ask questions and all were answered. The patient agreed with the plan and demonstrated an understanding of the  instructions.   The patient was advised to call back or seek an in-person evaluation if the symptoms worsen or if the condition fails to improve as anticipated.  Nance Pear, NP

## 2020-06-10 ENCOUNTER — Encounter: Payer: Self-pay | Admitting: Family

## 2020-06-11 MED ORDER — PROMETHAZINE-DM 6.25-15 MG/5ML PO SYRP: 5.0000 mL | ORAL_SOLUTION | Freq: Four times a day (QID) | ORAL | 0 refills | Status: DC | PRN

## 2020-06-30 ENCOUNTER — Other Ambulatory Visit (HOSPITAL_BASED_OUTPATIENT_CLINIC_OR_DEPARTMENT_OTHER): Payer: Self-pay | Admitting: Internal Medicine

## 2020-06-30 ENCOUNTER — Ambulatory Visit: Payer: BC Managed Care – PPO | Attending: Internal Medicine

## 2020-06-30 DIAGNOSIS — Z23 Encounter for immunization: Secondary | ICD-10-CM

## 2020-06-30 NOTE — Progress Notes (Signed)
   Covid-19 Vaccination Clinic  Name:  Haley Fuentes    MRN: [id number] DOB: April 22, 1961  06/30/2020  [name] was observed post Covid-19 immunization for 15 minutes without incident. She was provided with Vaccine Information Sheet and instruction to access the V-Safe system.  Vaccinated by Fredirick Maudlin  [name] was instructed to call 911 with any severe reactions post vaccine: Marland Kitchen Difficulty breathing  . Swelling of face and throat  . A fast heartbeat  . A bad rash all over body  . Dizziness and weakness   Immunizations Administered    Name Date Dose VIS Date Route   Pfizer COVID-19 Vaccine 06/30/2020  2:14 PM 0.3 mL 04/22/2020 Intramuscular   Manufacturer: [employer], [employer]   Lot: [id number]   NDC: [address]-1

## 2020-07-01 ENCOUNTER — Encounter: Payer: Self-pay | Admitting: Family

## 2020-07-01 MED FILL — PFIZER-BIONTECH COVID-19 VA: 30 | 21 days supply | Qty: 0 | Fill #0

## 2020-07-22 ENCOUNTER — Other Ambulatory Visit: Payer: Self-pay | Admitting: Family

## 2020-08-21 ENCOUNTER — Other Ambulatory Visit: Payer: Self-pay | Admitting: Family

## 2020-08-21 MED ORDER — BUPROPION HCL ER (XL) 300 MG PO TB24: 300.0000 mg | ORAL_TABLET | Freq: Every day | ORAL | 1 refills | Status: DC

## 2020-09-08 ENCOUNTER — Other Ambulatory Visit: Payer: Self-pay | Admitting: Family

## 2020-09-17 ENCOUNTER — Other Ambulatory Visit: Payer: Self-pay | Admitting: Family

## 2020-09-22 DIAGNOSIS — H43812 Vitreous degeneration, left eye: Secondary | ICD-10-CM | POA: Diagnosis not present

## 2020-09-23 ENCOUNTER — Encounter: Payer: Self-pay | Admitting: Family

## 2020-09-23 ENCOUNTER — Ambulatory Visit: Payer: BC Managed Care – PPO | Admitting: Family

## 2020-09-23 ENCOUNTER — Other Ambulatory Visit: Payer: Self-pay

## 2020-09-23 VITALS — BP 119/84 | HR 73 | Temp 97.9°F | Resp 16 | Ht 63.0 in | Wt 167.0 lb

## 2020-09-23 DIAGNOSIS — G4700 Insomnia, unspecified: Secondary | ICD-10-CM

## 2020-09-23 DIAGNOSIS — E785 Hyperlipidemia, unspecified: Secondary | ICD-10-CM | POA: Diagnosis not present

## 2020-09-23 DIAGNOSIS — F32A Depression, unspecified: Secondary | ICD-10-CM | POA: Diagnosis not present

## 2020-09-23 DIAGNOSIS — Z23 Encounter for immunization: Secondary | ICD-10-CM | POA: Diagnosis not present

## 2020-09-23 DIAGNOSIS — M2550 Pain in unspecified joint: Secondary | ICD-10-CM

## 2020-09-23 DIAGNOSIS — R21 Rash and other nonspecific skin eruption: Secondary | ICD-10-CM | POA: Insufficient documentation

## 2020-09-23 LAB — LIPID PANEL
Cholesterol: 230 mg/dL — ABNORMAL HIGH (ref 0–200)
HDL: 50.8 mg/dL (ref >39.00)
LDL Cholesterol: 146 mg/dL — ABNORMAL HIGH (ref 0–99)
NonHDL: 178.86
Total CHOL/HDL Ratio: 5
Triglycerides: 162 mg/dL — ABNORMAL HIGH (ref 0.0–149.0)
VLDL: 32.4 mg/dL (ref 0.0–40.0)

## 2020-09-23 MED ORDER — CLOTRIMAZOLE-BETAMETHASONE 1-0.05 % EX CREA: 1.0000 "application " | TOPICAL_CREAM | Freq: Two times a day (BID) | CUTANEOUS | 0 refills | Status: DC

## 2020-09-23 NOTE — Patient Instructions (Signed)
Please complete lab work prior to leaving.   

## 2020-09-23 NOTE — Assessment & Plan Note (Signed)
Maintained on atorvastatin 20mg  once daily.  Obtain follow up lipid panel.

## 2020-09-23 NOTE — Assessment & Plan Note (Signed)
New. Trial of lotrisone cream.

## 2020-09-23 NOTE — Progress Notes (Signed)
Subjective:   By signing my name below, I, Haley Fuentes, attest that this documentation has been prepared under the direction and in the presence of Haley Fuentes. 09/23/2020   Patient ID: Haley Fuentes, female    DOB: 1960/11/21, 60 y.o.   MRN: [id number]  Chief Complaint  Patient presents with  . Depression    Month follow up   . Joint Pain    All over body joint pain, left knee stiffness, muscle pain in chest   . Excessive Sweating  . Chest Pain    HPI Patient is in today for follow up visit today. She endorses having chest tightness but she relates this to muscle.  She also reports that she has been having an onset of muscle aches in her elbows, knees, neck, ankles, and more. She mentions that her elbow is the worse. She reports that her knee and ankles feel tight and achy. She notes that when she wakes up at night she has muscle soreness. She has a PMHx of Lupus.  She complains of a small rash on her chest. She is requesting help to treat.   She was recently seen by Optho and she was dx with Posterior Vitreous Detachment(PVD). She has an appointment with Dr. Moshe Cipro soon to discuss a plan. Optho is managing her dx.   She states that she is managing her mood well. She notes that she has been sweating from her 300mg   Wellburtin medication, however the medication has been very helpful.  She mentions that she is about to be a grandmother, which she is excited about.    Past Medical History:  Diagnosis Date  . Anemia    hx of iron deficient anemia  . Arthritis   . Blood transfusion without reported diagnosis   . Depression   . Gastric polyp   . GERD (gastroesophageal reflux disease)   . Hemorrhoids   . HLD (hyperlipidemia)   . Joint pain    left shoulder  . Kidney lesion    right  . Osteopenia 05/04/2017  . Postmenopausal     Past Surgical History:  Procedure Laterality Date  . ENDOMETRIAL ABLATION  2007   Novasure  . TUBAL LIGATION  1983    Family History   Problem Relation Age of Onset  . Diabetes Mother   . Heart disease Mother   . Breast cancer Mother 62  . Stroke Father   . Diabetes Father   . Heart disease Father        CABG, AVR, A-Fib  . Hepatitis Brother   . Alcohol abuse Brother   . Raynaud syndrome Sister   . Healthy Daughter   . Depression Son   . Anxiety disorder Son   . Healthy Son     Social History   Socioeconomic History  . Marital status: Married    Spouse name: Not on file  . Number of children: 2  . Years of education: Not on file  . Highest education level: Not on file  Occupational History  . 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 Smoking status: Former Smoker    Types: Cigarettes    Quit date: 07/04/1985    Years since quitting: 35.2  . Smokeless tobacco: Never Used  Vaping Use  . Vaping Use: Never used  Substance and Sexual Activity  . Alcohol use: Yes    Alcohol/week: 0.0 standard drinks    Comment: 4-5 times a week  .  Drug use: No  . Sexual activity: Not on file  Other Topics Concern  . 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      Social Determinants of Health   Financial Resource Strain: Not on file  Food Insecurity: Not on file  Transportation Needs: Not on file  Physical Activity: Not on file  Stress: Not on file  Social Connections: Not on file  Intimate Partner Violence: Not on file    Outpatient Medications Prior to Visit  Medication Sig Dispense Refill  . aspirin EC 81 MG tablet Take 1 tablet (81 mg total) by mouth daily. Swallow whole. 90 tablet 3  . buPROPion (WELLBUTRIN XL) 300 MG 24 hr tablet Take 1 tablet (300 mg total) by mouth daily. 90 tablet 1  . cyclobenzaprine (FLEXERIL) 10 MG tablet Take 1 tablet (10  mg total) by mouth 2 (two) times daily as needed for muscle spasms. 180 tablet 0  . Diclofenac Sodium (PENNSAID) 2 % SOLN Place 1 application onto the skin 2 (two) times daily. 112 g 3  . ESTRACE VAGINAL 0.1 MG/GM vaginal cream Place 1 application vaginally at bedtime.  1  . estradiol (ESTRACE) 0.5 MG tablet Take 0.5 mg by mouth daily.    Marland Kitchen FLUoxetine (PROZAC) 40 MG capsule Take 1 capsule (40 mg total) by mouth daily. 90 capsule 1  . GERI-LANTA 200-200-20 MG/5ML suspension 2 (two) times daily. 2 squirts twice per day    . magic mouthwash w/lidocaine SOLN 5 ml po qid swish and spit. 200 mL 0  . medroxyPROGESTERone (PROVERA) 2.5 MG tablet Take 1 tablet by mouth daily.    . Multiple Vitamins-Minerals (MULTIVITAMIN WITH MINERALS) tablet 1 tablet.    . nitroGLYCERIN (NITRODUR - DOSED IN MG/24 HR) 0.4 mg/hr patch Place 0.4 mg onto the skin daily. Cut and apply 1/4 of the patch to the most painful area.    . pantoprazole (PROTONIX) 40 MG tablet Take 1 tablet (40 mg total) by mouth 2 (two) times daily. 180 tablet 0  . promethazine-dextromethorphan (PROMETHAZINE-DM) 6.25-15 MG/5ML syrup Take 5 mLs by mouth 4 (four) times daily as needed for cough. 180 mL 0  . zolpidem (AMBIEN) 10 MG tablet TAKE ONE HALF TO ONE TABLET BY MOUTH EVERY NIGHT AT BEDTIME AS NEEDED FOR SLEEP 15 tablet 0  . atorvastatin (LIPITOR) 20 MG tablet Take 2 tablets (40 mg total) by mouth daily. 180 tablet 1   No facility-administered medications prior to visit.    Allergies  Allergen Reactions  . Hydrocodone-Acetaminophen     "made her skin crawl"  . Hydrocodone-Acetaminophen Itching  . Omni-Pac     vomitting  . Voltaren [Diclofenac]     diarrhea    Review of Systems  Constitutional:       (+)hot flashes  Cardiovascular:       (+)chest tightness  Musculoskeletal: Positive for joint pain (knees, elbows, neck).  Skin: Positive for rash (On chest area).  Psychiatric/Behavioral:       (+)sleep disturbance secondary to muscle  aches       Objective:    Physical Exam Constitutional:      General: She is not in acute distress.    Appearance: Normal appearance. She is not ill-appearing.  HENT:  Head: Normocephalic and atraumatic.     Right Ear: External ear normal.     Left Ear: External ear normal.  Eyes:     Extraocular Movements: Extraocular movements intact.     Pupils: Pupils are equal, round, and reactive to light.  Cardiovascular:     Rate and Rhythm: Normal rate and regular rhythm.     Pulses: Normal pulses.     Heart sounds: Normal heart sounds. No murmur heard.   Pulmonary:     Effort: Pulmonary effort is normal. No respiratory distress.     Breath sounds: Normal breath sounds. No wheezing or rales.  Skin:    General: Skin is warm and dry.     Comments: Mild erythematous rash noted between breast  Neurological:     Mental Status: She is alert and oriented to person, place, and time.  Psychiatric:        Behavior: Behavior normal.     BP 119/84 (BP Location: Right Arm, Patient Position: Sitting, Cuff Size: Normal)   Pulse 73   Temp 97.9 F (36.6 C)   Resp 16   Ht 5\' 3"  (1.6 m)   Wt 167 lb (75.8 kg)   LMP 07/04/2004   SpO2 97%   BMI 29.58 kg/m  Wt Readings from Last 3 Encounters:  09/23/20 167 lb (75.8 kg)  06/05/20 160 lb (72.6 kg)  05/18/20 165 lb (74.8 kg)    Diabetic Foot Exam - Simple   No data filed    Lab Results  Component Value Date   WBC 7.1 10/02/2019   HGB 12.4 10/02/2019   HCT 36.6 10/02/2019   PLT 234.0 10/02/2019   GLUCOSE 85 10/02/2019   CHOL 269 (H) 05/18/2020   TRIG 339.0 (H) 05/18/2020   HDL 47.50 05/18/2020   LDLDIRECT 134.0 05/18/2020   LDLCALC 139 (H) 04/06/2020   ALT 19 10/02/2019   AST 18 10/02/2019   NA 139 10/02/2019   K 4.3 10/02/2019   CL 103 10/02/2019   CREATININE 0.71 10/02/2019   BUN 16 10/02/2019   CO2 30 10/02/2019   TSH 1.57 10/02/2019   HGBA1C 5.4 04/14/2010    Lab Results  Component Value Date   TSH 1.57  10/02/2019   Lab Results  Component Value Date   WBC 7.1 10/02/2019   HGB 12.4 10/02/2019   HCT 36.6 10/02/2019   MCV 94.9 10/02/2019   PLT 234.0 10/02/2019   Lab Results  Component Value Date   NA 139 10/02/2019   K 4.3 10/02/2019   CO2 30 10/02/2019   GLUCOSE 85 10/02/2019   BUN 16 10/02/2019   CREATININE 0.71 10/02/2019   BILITOT 0.6 10/02/2019   ALKPHOS 76 10/02/2019   AST 18 10/02/2019   ALT 19 10/02/2019   PROT 7.0 10/02/2019   ALBUMIN 4.7 10/02/2019   CALCIUM 9.7 10/02/2019   GFR 84.35 10/02/2019   Lab Results  Component Value Date   CHOL 269 (H) 05/18/2020   Lab Results  Component Value Date   HDL 47.50 05/18/2020   Lab Results  Component Value Date   LDLCALC 139 (H) 04/06/2020   Lab Results  Component Value Date   TRIG 339.0 (H) 05/18/2020   Lab Results  Component Value Date   CHOLHDL 6 05/18/2020   Lab Results  Component Value Date   HGBA1C 5.4 04/14/2010       Assessment & Plan:   Problem List Items Addressed This Visit      Unprioritized   Skin  rash    New. Trial of lotrisone cream.       INSOMNIA, CHRONIC    Controlled substance contract is updated and will obtain follow up UDS. Continue ambien 10 mg once daily as needed.       Hyperlipidemia    Maintained on atorvastatin 20mg  once daily.  Obtain follow up lipid panel.      Relevant Orders   Lipid panel   Depression    Stable on wellbutrin xl 300mg  and prozac 40mg . Discussed that wellbutrin may be causing her to sweat.        Arthralgia    Especially the right elbow and left knee. She has seen rheumatology in the past for this and they recommended PT only.  I suggested she follow back up with her sports medicine doctor.        Other Visit Diagnoses    Insomnia, unspecified type    -  Primary   Relevant Orders   DRUG MONITORING, PANEL 8 WITH CONFIRMATION, URINE   Need for shingles vaccine       Relevant Orders   Varicella-zoster vaccine IM (Shingrix)      Meds  ordered this encounter  Medications  . clotrimazole-betamethasone (LOTRISONE) cream    Sig: Apply 1 application topically 2 (two) times daily.    Dispense:  30 g    Refill:  0    Order Specific Question:   Supervising Provider    Answer:   Penni Homans A [[id number]]    I, Nance Pear, NP, personally preformed the services described in this documentation.  All medical record entries made by the scribe were at my direction and in my presence.  I have reviewed the chart and discharge instructions (if applicable) and agree that the record reflects my personal performance and is accurate and complete. 09/23/2020  Haley Alar NP

## 2020-09-23 NOTE — Assessment & Plan Note (Signed)
Controlled substance contract is updated and will obtain follow up UDS. Continue ambien 10 mg once daily as needed.

## 2020-09-23 NOTE — Assessment & Plan Note (Signed)
Stable on wellbutrin xl 300mg  and prozac 40mg . Discussed that wellbutrin may be causing her to sweat.

## 2020-09-23 NOTE — Assessment & Plan Note (Signed)
Especially the right elbow and left knee. She has seen rheumatology in the past for this and they recommended PT only.  I suggested she follow back up with her sports medicine doctor.

## 2020-09-25 LAB — DRUG MONITORING, PANEL 8 WITH CONFIRMATION, URINE
6 Acetylmorphine: NEGATIVE ng/mL (ref <10)
Alcohol Metabolites: POSITIVE ng/mL — AB
Amphetamines: NEGATIVE ng/mL (ref <500)
Benzodiazepines: NEGATIVE ng/mL (ref <100)
Buprenorphine, Urine: NEGATIVE ng/mL (ref <5)
Cocaine Metabolite: NEGATIVE ng/mL (ref <150)
Creatinine: 135.1 mg/dL
Ethyl Glucuronide (ETG): 47209 ng/mL — ABNORMAL HIGH (ref <500)
Ethyl Sulfate (ETS): 9642 ng/mL — ABNORMAL HIGH (ref <100)
MDMA: NEGATIVE ng/mL (ref <500)
Marijuana Metabolite: NEGATIVE ng/mL (ref <20)
Opiates: NEGATIVE ng/mL (ref <100)
Oxidant: NEGATIVE ug/mL
Oxycodone: NEGATIVE ng/mL (ref <100)
pH: 6 (ref 4.5–9.0)

## 2020-09-25 LAB — DM TEMPLATE

## 2020-10-19 ENCOUNTER — Encounter: Payer: Self-pay | Admitting: Family Medicine

## 2020-10-20 DIAGNOSIS — H5213 Myopia, bilateral: Secondary | ICD-10-CM | POA: Diagnosis not present

## 2020-10-26 ENCOUNTER — Ambulatory Visit: Payer: Self-pay

## 2020-10-26 ENCOUNTER — Ambulatory Visit: Payer: BC Managed Care – PPO | Admitting: Family Medicine

## 2020-10-26 ENCOUNTER — Other Ambulatory Visit: Payer: Self-pay

## 2020-10-26 ENCOUNTER — Encounter: Payer: Self-pay | Admitting: Family Medicine

## 2020-10-26 VITALS — BP 138/74 | Ht 63.0 in | Wt 167.0 lb

## 2020-10-26 DIAGNOSIS — M25572 Pain in left ankle and joints of left foot: Secondary | ICD-10-CM

## 2020-10-26 DIAGNOSIS — S93492A Sprain of other ligament of left ankle, initial encounter: Secondary | ICD-10-CM

## 2020-10-26 DIAGNOSIS — M23301 Other meniscus derangements, unspecified lateral meniscus, left knee: Secondary | ICD-10-CM | POA: Diagnosis not present

## 2020-10-26 NOTE — Progress Notes (Signed)
Haley Fuentes - 60 y.o. female MRN [id number]  Date of birth: 1961/02/14  SUBJECTIVE:  Including CC & ROS.  No chief complaint on file.   Haley Fuentes is a 60 y.o. female that is presenting with left ankle pain and left knee pain.  She rolled her ankle about a week and a half ago.  Since that time she has had swelling around the distal fibula.  Able to bear weight.  Also having some left knee pain.   Review of Systems See HPI   HISTORY: Past Medical, Surgical, Social, and Family History Reviewed & Updated per EMR.   Pertinent Historical Findings include:  Past Medical History:  Diagnosis Date  . Anemia    hx of iron deficient anemia  . Arthritis   . Blood transfusion without reported diagnosis   . Depression   . Gastric polyp   . GERD (gastroesophageal reflux disease)   . Hemorrhoids   . HLD (hyperlipidemia)   . Joint pain    left shoulder  . Kidney lesion    right  . Osteopenia 05/04/2017  . Postmenopausal     Past Surgical History:  Procedure Laterality Date  . ENDOMETRIAL ABLATION  2007   Novasure  . TUBAL LIGATION  1983    Family History  Problem Relation Age of Onset  . Diabetes Mother   . Heart disease Mother   . Breast cancer Mother 54  . Stroke Father   . Diabetes Father   . Heart disease Father        CABG, AVR, A-Fib  . Hepatitis Brother   . Alcohol abuse Brother   . Raynaud syndrome Sister   . Healthy Daughter   . Depression Son   . Anxiety disorder Son   . Healthy Son     Social History   Socioeconomic History  . Marital status: Married    Spouse name: Not on file  . Number of children: 2  . Years of education: Not on file  . Highest education level: Not on file  Occupational History  . 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 Smoking status: Former Smoker    Types: Cigarettes    Quit date: 07/04/1985    Years since quitting: 35.3  . Smokeless tobacco: Never Used  Vaping Use  . Vaping Use: Never used   Substance and Sexual Activity  . Alcohol use: Yes    Alcohol/week: 0.0 standard drinks    Comment: 4-5 times a week  . Drug use: No  . Sexual activity: Not on file  Other Topics Concern  . 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      Social Determinants of Health   Financial Resource Strain: Not on file  Food Insecurity: Not on file  Transportation Needs: Not on file  Physical Activity: Not on file  Stress: Not on file  Social Connections: Not on file  Intimate Partner Violence: Not on file     PHYSICAL EXAM:  VS: BP 138/74 (BP Location: Left Arm, Patient Position: Sitting, Cuff Size: Large)   Ht 5\' 3"  (1.6 m)   Wt 167 lb (75.8 kg)   LMP 07/04/2004   BMI 29.58 kg/m  Physical  Exam Gen: NAD, alert, cooperative with exam, well-appearing MSK:  Left ankle: Normal range of motion. Translation with anterior drawer. Swelling around the anterior lateral aspect of the ankle joint. Neurovascular intact  Limited ultrasound: Left ankle, left knee:  Left ankle: No joint effusion. Hypoechoic changes of the ATFL. Increased hyperemia just distal to the fibula. Normal-appearing peroneal tendons.  Left knee: No effusion. Normal-appearing quadricep patellar tendon. Mild medial joint space narrowing. Mild degenerative changes of the lateral meniscus.  Summary: Left ankle sprain and mild degenerative changes of the left knee.  Ultrasound and interpretation by Clearance Coots, MD    ASSESSMENT & PLAN:   Sprain of anterior talofibular ligament of left ankle Findings consistent with ankle sprain.  Has ongoing swelling. -Counseled on home exercise therapy and supportive care. -Lace up ankle brace. -Could consider physical therapy.  Degenerative  tear of lateral meniscus of left knee Mild degenerative changes appreciated. -Counseled on home exercise therapy and supportive care. -Could consider physical therapy or injection.

## 2020-10-26 NOTE — Patient Instructions (Signed)
Good to see you Please try ice as needed  Please try the exercises  Please try the brace  Please send me a message in MyChart with any questions or updates.  Please see me back in 4 weeks or as needed if better.   --Dr. Raeford Razor

## 2020-10-27 DIAGNOSIS — S93492A Sprain of other ligament of left ankle, initial encounter: Secondary | ICD-10-CM | POA: Insufficient documentation

## 2020-10-27 DIAGNOSIS — M23301 Other meniscus derangements, unspecified lateral meniscus, left knee: Secondary | ICD-10-CM | POA: Insufficient documentation

## 2020-10-27 HISTORY — DX: Sprain of other ligament of left ankle, initial encounter: S93.492A

## 2020-10-27 NOTE — Assessment & Plan Note (Signed)
Findings consistent with ankle sprain.  Has ongoing swelling. -Counseled on home exercise therapy and supportive care. -Lace up ankle brace. -Could consider physical therapy.

## 2020-10-27 NOTE — Assessment & Plan Note (Signed)
Mild degenerative changes appreciated. -Counseled on home exercise therapy and supportive care. -Could consider physical therapy or injection.

## 2020-11-16 ENCOUNTER — Ambulatory Visit: Payer: BC Managed Care – PPO | Admitting: Family

## 2020-11-24 ENCOUNTER — Ambulatory Visit: Payer: BC Managed Care – PPO | Admitting: Family Medicine

## 2020-12-25 ENCOUNTER — Ambulatory Visit (INDEPENDENT_AMBULATORY_CARE_PROVIDER_SITE_OTHER): Payer: BC Managed Care – PPO | Admitting: Family

## 2020-12-25 ENCOUNTER — Encounter: Payer: Self-pay | Admitting: Family

## 2020-12-25 ENCOUNTER — Telehealth: Payer: Self-pay | Admitting: Family

## 2020-12-25 ENCOUNTER — Other Ambulatory Visit: Payer: Self-pay

## 2020-12-25 VITALS — BP 114/78 | HR 78 | Temp 98.4°F | Resp 16 | Ht 63.0 in | Wt 165.0 lb

## 2020-12-25 DIAGNOSIS — G4700 Insomnia, unspecified: Secondary | ICD-10-CM

## 2020-12-25 DIAGNOSIS — E785 Hyperlipidemia, unspecified: Secondary | ICD-10-CM | POA: Diagnosis not present

## 2020-12-25 DIAGNOSIS — Z23 Encounter for immunization: Secondary | ICD-10-CM | POA: Diagnosis not present

## 2020-12-25 DIAGNOSIS — F32A Depression, unspecified: Secondary | ICD-10-CM

## 2020-12-25 DIAGNOSIS — Z0000 Encounter for general adult medical examination without abnormal findings: Secondary | ICD-10-CM | POA: Diagnosis not present

## 2020-12-25 DIAGNOSIS — K219 Gastro-esophageal reflux disease without esophagitis: Secondary | ICD-10-CM

## 2020-12-25 LAB — LIPID PANEL
Cholesterol: 192 mg/dL (ref 0–200)
HDL: 41.6 mg/dL (ref >39.00)
NonHDL: 150.24
Total CHOL/HDL Ratio: 5
Triglycerides: 203 mg/dL — ABNORMAL HIGH (ref 0.0–149.0)
VLDL: 40.6 mg/dL — ABNORMAL HIGH (ref 0.0–40.0)

## 2020-12-25 LAB — LDL CHOLESTEROL, DIRECT: Direct LDL: 103 mg/dL

## 2020-12-25 MED ORDER — ATORVASTATIN CALCIUM 20 MG PO TABS: 40.0000 mg | ORAL_TABLET | Freq: Every day | ORAL | 1 refills | Status: DC

## 2020-12-25 NOTE — Assessment & Plan Note (Signed)
Lab Results  Component Value Date   CHOL 230 (H) 09/23/2020   HDL 50.80 09/23/2020   LDLCALC 146 (H) 09/23/2020   LDLDIRECT 134.0 05/18/2020   TRIG 162.0 (H) 09/23/2020   CHOLHDL 5 09/23/2020   Continue atorvastatin 40mg , repeat lipid panel.

## 2020-12-25 NOTE — Assessment & Plan Note (Signed)
Wt Readings from Last 3 Encounters:  12/25/20 165 lb (74.8 kg)  10/26/20 167 lb (75.8 kg)  09/23/20 167 lb (75.8 kg)   Continue healthy diet, advised pt to try to add 30 minutes 5 days a week.

## 2020-12-25 NOTE — Assessment & Plan Note (Signed)
Stable improved with dietary changes.  Has not filled protonix.

## 2020-12-25 NOTE — Assessment & Plan Note (Signed)
Stable, continue prozac40mg  and wellbutrin 300mg .

## 2020-12-25 NOTE — Patient Instructions (Addendum)
Please get your covid booster shot.   Try to add 30 minutes of walking 5 days a week.

## 2020-12-25 NOTE — Telephone Encounter (Signed)
Request will befaxed 

## 2020-12-25 NOTE — Addendum Note (Signed)
Addended by: Jiles Prows on: 12/25/2020 08:44 AM   Modules accepted: Orders

## 2020-12-25 NOTE — Progress Notes (Signed)
Subjective:   By signing my name below, I, Shehryar Baig, attest that this documentation has been prepared under the direction and in the presence of [name] NP. 12/25/2020   Patient ID: Haley Fuentes, female    DOB: 21-Nov-1960, 60 y.o.   MRN: [id number]  Chief Complaint  Patient presents with   Annual Exam    HPI Patient is in today for a comprehensive physical exam. She denies having any unexpected weight change, hearing loss and rhinorrhea, visual disturbance, cough, chest pain and leg swelling, nausea, vomiting, diarrhea and blood in stool, or dysuria and frequency, for myalgias and arthralgias, rash, headaches, adenopathy, depression or anxiety at this time.  Anxiety/Depression- She has recently had many new stressors in her daily life and work life but reports keeping her mood stable. She continues taking 300 mg Wellbutrin daily PO and 40 mg Prozac daily PO. She notes that recently before she falls asleep her muscles all over her body start twitching. It is not painful and goes away shortly after it starts. She thinks this is due to increased anxiety.  GERD- She is not taking protonix. Stable with dietary changes.  Insomnia- She continues taking 5 mg Ambien daily PO to manage her insomnia. She is not consistent in taking it. Hyperlipidemia- She continues taking 20 mg atorvastatin daily PO to manage her hyperlipidemia. She reports no new issues while taking it. Her lipid levels were slightly elevated during her last lipid panel. Lab Results  Component Value Date   CHOL 230 (H) 09/23/2020   HDL 50.80 09/23/2020   LDLCALC 146 (H) 09/23/2020   LDLDIRECT 134.0 05/18/2020   TRIG 162.0 (H) 09/23/2020   CHOLHDL 5 09/23/2020   Immunizations: She has 2 Covid-19 vaccines at this time and is willing to get the 1st booster vaccine. She is planning to get her shingles 2nd vaccine.  Diet: She manages a healthy diet. She stopped drinking wine and started drinking vodka. She is limiting  her carbohydrate intake. She is not interested in getting HIV screening at this time.  Exercise: She does not participates in regular exercise. She is planning to start walking daily.  Colonoscopy: Last completed 05/10/2019. Results showed one 2 mm polyps in the ascending colon removed, otherwise results are normal. Dexa: Last completed 05/04/2017. Results showed osteopenia. Repeat in 2 years. Due.  Pap Smear: Her GYN, Dr. Sharyn Lull completes her pap smears and mammograms.  Mammogram: Last completed 05/18/2018. Results normal. Repeat in 1 year. She has an upcoming appointment to complete her mammogram with her GYN. Dental: She is UTD on dental care. Vision: She is UTD on vision care.  Health Maintenance Due  Topic Date Due   URINE MICROALBUMIN  Never done   HIV Screening  Never done   PAP SMEAR-Modifier  03/22/2020   MAMMOGRAM  05/18/2020   COVID-19 Vaccine (4 - Booster for Pfizer series) 10/29/2020   Zoster Vaccines- Shingrix (2 of 2) 11/18/2020    Past Medical History:  Diagnosis Date   Anemia    hx of iron deficient anemia   Arthritis    Blood transfusion without reported diagnosis    Depression    Gastric polyp    GERD (gastroesophageal reflux disease)    Hemorrhoids    HLD (hyperlipidemia)    Joint pain    left shoulder   Kidney lesion    right   Osteopenia 05/04/2017   Postmenopausal     Past Surgical History:  Procedure Laterality Date   ENDOMETRIAL ABLATION  2007   Novasure   TUBAL LIGATION  1983    Family History  Problem Relation Age of Onset   Diabetes Mother    Heart disease Mother    Breast cancer Mother 53   Stroke Father    Diabetes Father    Heart disease Father        CABG, AVR, A-Fib   Hepatitis Brother    Alcohol abuse Brother    Raynaud syndrome Sister    Healthy Daughter    Depression Son    Anxiety disorder Son    Healthy Son     Social History   Socioeconomic History   Marital status: Married    Spouse name: Not on file   Number  of children: 2   Years of education: Not on file   Highest education level: Not on file  Occupational History   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[employer]    Employer: [employer]  Tobacco Use   Smoking status: Former    Pack years: 0.00    Types: Cigarettes    Quit date: 07/04/1985    Years since quitting: 35.5   Smokeless tobacco: Never  Vaping Use   Vaping Use: Never used  Substance and Sexual Activity   Alcohol use: Yes    Alcohol/week: 0.0 standard drinks    Comment: 4-5 times a week   Drug use: No   Sexual activity: Not on file  Other Topics Concern   Not on file  Social History Narrative   Regular exercise: 30 minutes 5 days a week   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      Social Determinants of Health   Financial Resource Strain: Not on file  Food Insecurity: Not on file  Transportation Needs: Not on file  Physical Activity: Not on file  Stress: Not on file  Social Connections: Not on file  Intimate Partner Violence: Not on file    Outpatient Medications Prior to Visit  Medication Sig Dispense Refill   aspirin EC 81 MG tablet Take 1 tablet (81 mg total) by mouth daily. Swallow whole. 90 tablet 3   buPROPion (WELLBUTRIN XL) 300 MG 24 hr tablet Take 1 tablet (300 mg total) by mouth daily. 90 tablet 1   clotrimazole-betamethasone (LOTRISONE) cream Apply 1 application topically 2 (two) times daily. 30 g 0   COVID-19 mRNA vaccine, Pfizer, 30 MCG/0.3ML injection INJECT AS DIRECTED .3 mL 0   cyclobenzaprine (FLEXERIL) 10 MG tablet Take 1 tablet (10 mg total) by mouth 2 (two) times daily as needed for muscle spasms. 180 tablet 0   Diclofenac Sodium (PENNSAID) 2 % SOLN Place 1 application onto the skin 2 (two) times daily. 112 g 3   ESTRACE VAGINAL 0.1 MG/GM vaginal cream Place 1 application vaginally at bedtime.  1   estradiol  (ESTRACE) 0.5 MG tablet Take 0.5 mg by mouth daily.     FLUoxetine (PROZAC) 40 MG capsule Take 1 capsule (40 mg total) by mouth daily. 90 capsule 1   GERI-LANTA 200-200-20 MG/5ML suspension 2 (two) times daily. 2 squirts twice per day     magic mouthwash w/lidocaine SOLN 5 ml po qid swish and spit. 200 mL 0   medroxyPROGESTERone (PROVERA) 2.5 MG tablet Take 1 tablet by mouth daily.     Multiple Vitamins-Minerals (MULTIVITAMIN WITH MINERALS) tablet 1 tablet.  nitroGLYCERIN (NITRODUR - DOSED IN MG/24 HR) 0.4 mg/hr patch Place 0.4 mg onto the skin daily. Cut and apply 1/4 of the patch to the most painful area.     pantoprazole (PROTONIX) 40 MG tablet Take 1 tablet (40 mg total) by mouth 2 (two) times daily. 180 tablet 0   promethazine-dextromethorphan (PROMETHAZINE-DM) 6.25-15 MG/5ML syrup Take 5 mLs by mouth 4 (four) times daily as needed for cough. 180 mL 0   zolpidem (AMBIEN) 10 MG tablet TAKE ONE HALF TO ONE TABLET BY MOUTH EVERY NIGHT AT BEDTIME AS NEEDED FOR SLEEP 15 tablet 0   atorvastatin (LIPITOR) 20 MG tablet Take 2 tablets (40 mg total) by mouth daily. 180 tablet 1   No facility-administered medications prior to visit.    Allergies  Allergen Reactions   Hydrocodone-Acetaminophen     "made her skin crawl"   Hydrocodone-Acetaminophen Itching   Omni-Pac     vomitting   Voltaren [Diclofenac]     diarrhea    Review of Systems  Constitutional:        (-)Unexpected weight change (-)Adenopathy  HENT:  Negative for hearing loss.        (-)rhinorrhea  Eyes:        (-)visual disturbance  Respiratory:  Negative for cough.   Cardiovascular:  Negative for chest pain and leg swelling.  Gastrointestinal:  Negative for blood in stool, constipation, diarrhea, nausea and vomiting.  Genitourinary:  Negative for dysuria and frequency.  Musculoskeletal:  Negative for joint pain and myalgias.  Skin:  Negative for rash.  Neurological:  Negative for headaches.  Psychiatric/Behavioral:   Negative for depression. The patient is not nervous/anxious.       Objective:    Physical Exam Constitutional:      General: She is not in acute distress.    Appearance: Normal appearance. She is not ill-appearing.  HENT:     Head: Normocephalic and atraumatic.     Right Ear: Tympanic membrane, ear canal and external ear normal.     Left Ear: Tympanic membrane, ear canal and external ear normal.  Eyes:     Extraocular Movements: Extraocular movements intact.     Pupils: Pupils are equal, round, and reactive to light.     Comments: No nystagmus  Cardiovascular:     Rate and Rhythm: Normal rate and regular rhythm.     Pulses: Normal pulses.     Heart sounds: Normal heart sounds. No murmur heard.   No gallop.  Pulmonary:     Effort: Pulmonary effort is normal. No respiratory distress.     Breath sounds: Normal breath sounds. No wheezing, rhonchi or rales.  Abdominal:     General: Bowel sounds are normal. There is no distension.     Palpations: Abdomen is soft. There is no mass.     Tenderness: There is no abdominal tenderness. There is no guarding or rebound.     Hernia: No hernia is present.  Musculoskeletal:     Comments: 5/5 strength in both upper and lower extremities  Skin:    General: Skin is warm and dry.  Neurological:     Mental Status: She is alert and oriented to person, place, and time.     Deep Tendon Reflexes:     Reflex Scores:      Patellar reflexes are 2+ on the right side and 2+ on the left side. Psychiatric:        Behavior: Behavior normal.    BP 114/78 (BP Location: Right  Arm, Patient Position: Sitting, Cuff Size: Small)   Pulse 78   Temp 98.4 F (36.9 C) (Oral)   Resp 16   Ht 5\' 3"  (1.6 m)   Wt 165 lb (74.8 kg)   LMP 07/04/2004   SpO2 98%   BMI 29.23 kg/m  Wt Readings from Last 3 Encounters:  12/25/20 165 lb (74.8 kg)  10/26/20 167 lb (75.8 kg)  09/23/20 167 lb (75.8 kg)       Assessment & Plan:   Problem List Items Addressed This  Visit       Unprioritized   Preventative health care - Primary    Wt Readings from Last 3 Encounters:  12/25/20 165 lb (74.8 kg)  10/26/20 167 lb (75.8 kg)  09/23/20 167 lb (75.8 kg)  Continue healthy diet, advised pt to try to add 30 minutes 5 days a week.        INSOMNIA, CHRONIC    Uses ambien prn 10mg .  Finds this helpful. Continue same.        Hyperlipidemia    Lab Results  Component Value Date   CHOL 230 (H) 09/23/2020   HDL 50.80 09/23/2020   LDLCALC 146 (H) 09/23/2020   LDLDIRECT 134.0 05/18/2020   TRIG 162.0 (H) 09/23/2020   CHOLHDL 5 09/23/2020  Continue atorvastatin 40mg , repeat lipid panel.        Relevant Medications   atorvastatin (LIPITOR) 20 MG tablet   Other Relevant Orders   Lipid panel   GERD (gastroesophageal reflux disease)    Stable improved with dietary changes.  Has not filled protonix.         Depression    Stable, continue prozac40mg  and wellbutrin 300mg .          Meds ordered this encounter  Medications   atorvastatin (LIPITOR) 20 MG tablet    Sig: Take 2 tablets (40 mg total) by mouth daily.    Dispense:  180 tablet    Refill:  1    Order Specific Question:   Supervising Provider    Answer:   Penni Homans A [[id number]]    I, [name] NP, personally preformed the services described in this documentation.  All medical record entries made by the scribe were at my direction and in my presence.  I have reviewed the chart and discharge instructions (if applicable) and agree that the record reflects my personal performance and is accurate and complete. 12/25/2020   I,Shehryar Baig,acting as a Education administrator for Nance Pear, NP.,have documented all relevant documentation on the behalf of Nance Pear, NP,as directed by  Nance Pear, NP while in the presence of Nance Pear, NP.   Nance Pear, NP

## 2020-12-25 NOTE — Telephone Encounter (Signed)
Please call Dr. Malachi Carl office and request copy of pap and mammogram.

## 2020-12-25 NOTE — Assessment & Plan Note (Signed)
Uses ambien prn 10mg .  Finds this helpful. Continue same.

## 2021-01-08 ENCOUNTER — Encounter: Payer: Self-pay | Admitting: Family

## 2021-01-08 ENCOUNTER — Other Ambulatory Visit: Payer: Self-pay | Admitting: Family

## 2021-01-08 MED ORDER — CYCLOBENZAPRINE HCL 10 MG PO TABS: 10.0000 mg | ORAL_TABLET | Freq: Two times a day (BID) | ORAL | 0 refills | Status: DC | PRN

## 2021-01-08 MED ORDER — LIDOCAINE 5 % EX PTCH: 1.0000 | MEDICATED_PATCH | CUTANEOUS | 0 refills | Status: DC

## 2021-01-11 ENCOUNTER — Telehealth: Payer: Self-pay | Admitting: *Deleted

## 2021-01-11 NOTE — Telephone Encounter (Signed)
Prior auth started via cover my meds.  Awaiting determination.  Key: [id number]

## 2021-01-13 NOTE — Telephone Encounter (Addendum)
Lidocaine patches was denied by insurance.  They only cover it if it is for the treatment of lasting nerve and skin pain caused by shingles (postherpetic neuralgia).  Do you have any other recommendations?

## 2021-01-20 DIAGNOSIS — Z01419 Encounter for gynecological examination (general) (routine) without abnormal findings: Secondary | ICD-10-CM | POA: Diagnosis not present

## 2021-01-20 DIAGNOSIS — Z6829 Body mass index (BMI) 29.0-29.9, adult: Secondary | ICD-10-CM | POA: Diagnosis not present

## 2021-02-10 ENCOUNTER — Ambulatory Visit
Admission: EM | Admit: 2021-02-10 | Discharge: 2021-02-10 | Disposition: A | Discharge status: Home / Self Care | Payer: BC Managed Care – PPO | Attending: Emergency Medicine | Admitting: Emergency Medicine

## 2021-02-10 ENCOUNTER — Encounter: Payer: Self-pay | Admitting: Emergency Medicine

## 2021-02-10 ENCOUNTER — Other Ambulatory Visit: Payer: Self-pay

## 2021-02-10 DIAGNOSIS — M7661 Achilles tendinitis, right leg: Secondary | ICD-10-CM | POA: Diagnosis not present

## 2021-02-10 MED ORDER — PREDNISONE 10 MG PO TABS: ORAL_TABLET | ORAL | 0 refills | Status: DC

## 2021-02-10 MED ORDER — KETOROLAC TROMETHAMINE 30 MG/ML IJ SOLN
30.0000 mg | Freq: Once | INTRAMUSCULAR | Status: AC
Start: 2021-02-10 — End: 2021-02-10
  Administered 2021-02-10: 30 mg via INTRAMUSCULAR

## 2021-02-10 MED ORDER — NAPROXEN 250 MG PO TABS
500.0000 mg | ORAL_TABLET | Freq: Two times a day (BID) | ORAL | 0 refills | Status: DC
Start: 2021-02-10 — End: 2021-05-31

## 2021-02-10 NOTE — Discharge Instructions (Addendum)
We gave you a shot of Toradol Begin prednisone taper x6 days-begin with 6 tablets on day 1, decrease by 1 tablet each day until complete-6, 5, 4, 3, 2, 1-take with food and earlier in the day if possible After completing prednisone course May use Naprosyn twice daily with food as needed Gentle stretching, apply ice after Follow-up with sports medicine if still persisting

## 2021-02-10 NOTE — ED Provider Notes (Signed)
[facility]    CSN: [id number] Arrival date & time: 02/10/21  1111      History   Chief Complaint Chief Complaint  Patient presents with   Leg Pain    HPI Haley Fuentes is a 60 y.o. female history of GERD, presenting today for evaluation of right leg pain.  Reports that she has had pain along her right Achilles over the past 5 days.  Increased pain with ambulation.  Denies fall or trauma.  Used nitro patch and topical Pennsaid.  Reports history of similar in the past.  Denies new injury or trauma.  HPI  Past Medical History:  Diagnosis Date   Anemia    hx of iron deficient anemia   Arthritis    Blood transfusion without reported diagnosis    Depression    Gastric polyp    GERD (gastroesophageal reflux disease)    Hemorrhoids    HLD (hyperlipidemia)    Joint pain    left shoulder   Kidney lesion    right   Osteopenia 05/04/2017   Postmenopausal     Patient Active Problem List   Diagnosis Date Noted   Sprain of anterior talofibular ligament of left ankle 10/27/2020   Degenerative tear of lateral meniscus of left knee 10/27/2020   Skin rash 09/23/2020   Effusion of joint of left shoulder 11/12/2019   Subacromial bursitis 05/24/2019   Osteopenia 05/04/2017   Nonspecific abnormal electrocardiogram (ECG) (EKG) 04/14/2015   TMJ (temporomandibular joint syndrome) 04/11/2015   Preventative health care 01/30/2015   Arthralgia 01/30/2015   Tendonitis, Achilles, right 09/04/2014   Carpal tunnel syndrome 10/08/2013   GERD (gastroesophageal reflux disease) 01/14/2013   Allergic rhinitis 02/17/2012   Low back pain 09/23/2011   Anemia 04/29/2011   INSOMNIA, CHRONIC 09/17/2010   Hyperlipidemia 12/02/2008   Depression 11/10/2007   HEMORRHOIDS 11/10/2007    Past Surgical History:  Procedure Laterality Date   ENDOMETRIAL ABLATION  2007   Novasure   TUBAL LIGATION  1983    OB History   No obstetric history on file.      Home Medications    Prior to  Admission medications   Medication Sig Start Date End Date Taking? Authorizing Provider  aspirin EC 81 MG tablet Take 1 tablet (81 mg total) by mouth daily. Swallow whole. 01/24/20  Yes Tobb, Kardie, DO  atorvastatin (LIPITOR) 20 MG tablet Take 2 tablets (40 mg total) by mouth daily. 12/25/20 03/25/21 Yes [name], NP  buPROPion (WELLBUTRIN XL) 300 MG 24 hr tablet Take 1 tablet (300 mg total) by mouth daily. 08/21/20  Yes [name], NP  Diclofenac Sodium (PENNSAID) 2 % SOLN Place 1 application onto the skin 2 (two) times daily. 11/14/19  Yes Rosemarie Ax, MD  estradiol (ESTRACE) 0.5 MG tablet Take 0.5 mg by mouth daily.   Yes [provider]  FLUoxetine (PROZAC) 40 MG capsule Take 1 capsule (40 mg total) by mouth daily. 09/17/20  Yes [name], NP  Multiple Vitamins-Minerals (MULTIVITAMIN WITH MINERALS) tablet 1 tablet.   Yes [provider]  naproxen (NAPROSYN) 250 MG tablet Take 2 tablets (500 mg total) by mouth 2 (two) times daily with a meal. As needed after completing prednisone 02/10/21  Yes Carvel Huskins C, PA-C  nitroGLYCERIN (NITRODUR - DOSED IN MG/24 HR) 0.4 mg/hr patch Place 0.4 mg onto the skin daily. Cut and apply 1/4 of the patch to the most painful area.   Yes [provider]  predniSONE (DELTASONE)  10 MG tablet Begin with 6 tabs on day 1, 5 tab on day 2, 4 tab on day 3, 3 tab on day 4, 2 tab on day 5, 1 tab on day 6-take with food 02/10/21  Yes Glennie Rodda C, PA-C  clotrimazole-betamethasone (LOTRISONE) cream Apply 1 application topically 2 (two) times daily. 09/23/20   [name], NP  COVID-19 mRNA vaccine, Pfizer, 30 MCG/0.3ML injection INJECT AS DIRECTED 06/30/20 06/30/21  Carlyle Basques, MD  cyclobenzaprine (FLEXERIL) 10 MG tablet Take 1 tablet (10 mg total) by mouth 2 (two) times daily as needed for muscle spasms. 01/08/21   [name], NP  ESTRACE VAGINAL 0.1 MG/GM vaginal cream Place 1 application  vaginally at bedtime. 02/05/16   [provider]  Benetta Spar 200-200-20 MG/5ML suspension 2 (two) times daily. 2 squirts twice per day 08/07/19   [provider]  lidocaine (LIDODERM) 5 % Place 1 patch onto the skin daily. Remove & Discard patch within 12 hours or as directed by MD 01/08/21   [name], NP  magic mouthwash w/lidocaine SOLN 5 ml po qid swish and spit. 08/07/19   Saguier, Percell Miller, PA-C  medroxyPROGESTERone (PROVERA) 2.5 MG tablet Take 1 tablet by mouth daily. 07/26/11   Bobbye Charleston, MD  zolpidem (AMBIEN) 10 MG tablet TAKE ONE HALF TO ONE TABLET BY MOUTH EVERY NIGHT AT BEDTIME AS NEEDED FOR SLEEP 09/08/20   [name], NP    Family History Family History  Problem Relation Age of Onset   Diabetes Mother    Heart disease Mother    Breast cancer Mother 73   Stroke Father    Diabetes Father    Heart disease Father        CABG, AVR, A-Fib   Hepatitis Brother    Alcohol abuse Brother    Raynaud syndrome Sister    Healthy Daughter    Depression Son    Anxiety disorder Son    Healthy Son     Social History Social History   Tobacco Use   Smoking status: Former    Types: Cigarettes    Quit date: 07/04/1985    Years since quitting: 35.6   Smokeless tobacco: Never  Vaping Use   Vaping Use: Never used  Substance Use Topics   Alcohol use: Yes    Alcohol/week: 0.0 standard drinks    Comment: 4-5 times a week   Drug use: No     Allergies   Hydrocodone-acetaminophen, Hydrocodone-acetaminophen, Omni-pac, and Voltaren [diclofenac]   Review of Systems Review of Systems  Constitutional:  Negative for fatigue and fever.  HENT:  Negative for mouth sores.   Eyes:  Negative for visual disturbance.  Respiratory:  Negative for shortness of breath.   Cardiovascular:  Negative for chest pain.  Gastrointestinal:  Negative for abdominal pain, nausea and vomiting.  Genitourinary:  Negative for genital sores.  Musculoskeletal:  Positive for  arthralgias and gait problem. Negative for joint swelling.  Skin:  Negative for color change, rash and wound.  Neurological:  Negative for dizziness, weakness, light-headedness and headaches.    Physical Exam Triage Vital Signs ED Triage Vitals [02/10/21 1125]  Enc Vitals Group     BP      Pulse      Resp      Temp      Temp src      SpO2      Weight      Height      Head Circumference  Peak Flow      Pain Score 7     Pain Loc      Pain Edu?      Excl. in [place]?    No data found.  Updated Vital Signs BP 135/89 (BP Location: Right Arm)   Pulse 66   Temp 98.3 F (36.8 C) (Oral)   Resp 13   LMP 07/04/2004   SpO2 98%   Visual Acuity Right Eye Distance:   Left Eye Distance:   Bilateral Distance:    Right Eye Near:   Left Eye Near:    Bilateral Near:     Physical Exam Vitals and nursing note reviewed.  Constitutional:      Appearance: She is well-developed.     Comments: No acute distress  HENT:     Head: Normocephalic and atraumatic.     Nose: Nose normal.  Eyes:     Conjunctiva/sclera: Conjunctivae normal.  Cardiovascular:     Rate and Rhythm: Normal rate.  Pulmonary:     Effort: Pulmonary effort is normal. No respiratory distress.  Abdominal:     General: There is no distension.  Musculoskeletal:        General: Normal range of motion.     Cervical back: Neck supple.     Comments: Right foot: No obvious swelling or deformity, no overlying erythema or warmth, tenderness to palpation along mid-distal Achilles; negative Thompson's  Skin:    General: Skin is warm and dry.  Neurological:     Mental Status: She is alert and oriented to person, place, and time.     UC Treatments / Results  Labs (all labs ordered are listed, but only abnormal results are displayed) Labs Reviewed - No data to display  EKG   Radiology No results found.  Procedures Procedures (including critical care time)  Medications Ordered in UC Medications  ketorolac  (TORADOL) 30 MG/ML injection 30 mg (30 mg Intramuscular Given 02/10/21 1210)    Initial Impression / Assessment and Plan / UC Course  I have reviewed the triage vital signs and the nursing notes.  Pertinent labs & imaging results that were available during my care of the patient were reviewed by me and considered in my medical decision making (see chart for details).     Right Achilles tendinitis-provided Toradol prior to discharge, will treat with prednisone taper times to 6 days followed by NSAIDs as needed, stretches and rehab exercises provided, encouraged follow-up with sports medicine if persistent.  Discussed strict return precautions. Patient verbalized understanding and is agreeable with plan.  Final Clinical Impressions(s) / UC Diagnoses   Final diagnoses:  Right Achilles tendinitis     Discharge Instructions      We gave you a shot of Toradol Begin prednisone taper x6 days-begin with 6 tablets on day 1, decrease by 1 tablet each day until complete-6, 5, 4, 3, 2, 1-take with food and earlier in the day if possible After completing prednisone course May use Naprosyn twice daily with food as needed Gentle stretching, apply ice after Follow-up with sports medicine if still persisting     ED Prescriptions     Medication Sig Dispense Auth. Provider   predniSONE (DELTASONE) 10 MG tablet Begin with 6 tabs on day 1, 5 tab on day 2, 4 tab on day 3, 3 tab on day 4, 2 tab on day 5, 1 tab on day 6-take with food 21 tablet Leiby Pigeon C, PA-C   naproxen (NAPROSYN) 250 MG tablet Take  2 tablets (500 mg total) by mouth 2 (two) times daily with a meal. As needed after completing prednisone 30 tablet Alegandro Macnaughton C, PA-C      PDMP not reviewed this encounter.   Janith Lima, [place] 02/10/21 1252

## 2021-02-10 NOTE — ED Triage Notes (Signed)
Patient c/o RT lower leg pain that started 5 days ago.   Patient endorses increased pain with ambulation.   Patient denies any fall or trauma.   Patient endorses history of Achilles tendonitis.   Patient has used " nitro patch and pnsed" w/ no relief of symptoms.

## 2021-02-16 ENCOUNTER — Other Ambulatory Visit: Payer: Self-pay | Admitting: Family

## 2021-02-18 DIAGNOSIS — Z1231 Encounter for screening mammogram for malignant neoplasm of breast: Secondary | ICD-10-CM | POA: Diagnosis not present

## 2021-03-09 ENCOUNTER — Encounter: Payer: Self-pay | Admitting: Family

## 2021-03-10 ENCOUNTER — Other Ambulatory Visit: Payer: Self-pay | Admitting: Family

## 2021-03-10 DIAGNOSIS — N289 Disorder of kidney and ureter, unspecified: Secondary | ICD-10-CM | POA: Insufficient documentation

## 2021-03-10 DIAGNOSIS — K317 Polyp of stomach and duodenum: Secondary | ICD-10-CM | POA: Insufficient documentation

## 2021-03-10 DIAGNOSIS — M1990 Unspecified osteoarthritis, unspecified site: Secondary | ICD-10-CM | POA: Insufficient documentation

## 2021-03-10 DIAGNOSIS — Z780 Asymptomatic menopausal state: Secondary | ICD-10-CM | POA: Insufficient documentation

## 2021-03-10 NOTE — Telephone Encounter (Signed)
Appointment was made for 03-12-21

## 2021-03-11 ENCOUNTER — Encounter: Payer: Self-pay | Admitting: Cardiology

## 2021-03-11 ENCOUNTER — Ambulatory Visit: Payer: BC Managed Care – PPO | Admitting: Cardiology

## 2021-03-11 ENCOUNTER — Other Ambulatory Visit: Payer: Self-pay

## 2021-03-11 VITALS — BP 116/84 | HR 73 | Ht 63.0 in | Wt 167.0 lb

## 2021-03-11 DIAGNOSIS — E782 Mixed hyperlipidemia: Secondary | ICD-10-CM | POA: Diagnosis not present

## 2021-03-11 DIAGNOSIS — I2510 Atherosclerotic heart disease of native coronary artery without angina pectoris: Secondary | ICD-10-CM | POA: Diagnosis not present

## 2021-03-11 MED ORDER — ISOSORBIDE MONONITRATE ER 30 MG PO TB24: 30.0000 mg | ORAL_TABLET | Freq: Every day | ORAL | 3 refills | Status: DC

## 2021-03-11 NOTE — Patient Instructions (Signed)
Medication Instructions:  Your physician has recommended you make the following change in your medication:  START: Imur 30 mg once daily *If you need a refill on your cardiac medications before your next appointment, please call your pharmacy*   Lab Work: None If you have labs (blood work) drawn today and your tests are completely normal, you will receive your results only by: [place] (if you have MyChart) OR A paper copy in the mail If you have any lab test that is abnormal or we need to change your treatment, we will call you to review the results.   Testing/Procedures: None   Follow-Up: At [facility], you and your health needs are our priority.  As part of our continuing mission to provide you with exceptional heart care, we have created designated Provider Care Teams.  These Care Teams include your primary Cardiologist (physician) and Advanced Practice Providers (APPs -  Physician Assistants and Nurse Practitioners) who all work together to provide you with the care you need, when you need it.  We recommend signing up for the patient portal called "MyChart".  Sign up information is provided on this After Visit Summary.  MyChart is used to connect with patients for Virtual Visits (Telemedicine).  Patients are able to view lab/test results, encounter notes, upcoming appointments, etc.  Non-urgent messages can be sent to your provider as well.   To learn more about what you can do with MyChart, go to [email or web address].    Your next appointment:   1 year(s)  The format for your next appointment:   In Person  Provider:   Berniece Salines, DO [address], [place], [place] [address]    Other Instructions

## 2021-03-11 NOTE — Progress Notes (Signed)
Cardiology Office Note:    Date:  03/11/2021   ID:  Merrilee Seashore, DOB 1960-09-19, MRN [id number]  PCP:  [name], NP  Cardiologist:  Berniece Salines, DO  Electrophysiologist:  None   Referring MD: [name], NP   " I am having some chest pain"  History of Present Illness:    Maurie Joens is a 60 y.o. female with a hx of hyperlipidemia, nonobstructive coronary artery disease seen on coronary CTA, hyperlipidemia is here today for follow-up visit. I saw the patient on October 04, 2019 at that time she complained of precordial chest pain given her family history I recommended she undergo coronary CTA.  I saw the patient on January 24, 2020 at that time we discussed her testing results which show evidence of coronary artery disease we will increase her statin to moderate intensity statin.  Since I saw the patient she has been doing well but recently she noticed that she has been having intermittent chest discomfort.  States that she thought it was her GERD she has her PCP to restart her on Protonix.  She has also been taking over-the-counter omeprazole.  A midsternal chest discomfort which is intermittent.  Of note she is really excited today she is expecting her first grandchild.  Past Medical History:  Diagnosis Date   Anemia    hx of iron deficient anemia   Arthritis    Blood transfusion without reported diagnosis    Depression    Gastric polyp    GERD (gastroesophageal reflux disease)    Hemorrhoids    HLD (hyperlipidemia)    Joint pain    left shoulder   Kidney lesion    right   Osteopenia 05/04/2017   Postmenopausal     Past Surgical History:  Procedure Laterality Date   ENDOMETRIAL ABLATION  2007   Novasure   TUBAL LIGATION  1983    Current Medications: Current Meds  Medication Sig   aspirin EC 81 MG tablet Take 1 tablet (81 mg total) by mouth daily. Swallow whole.   atorvastatin (LIPITOR) 40 MG tablet Take 40 mg by mouth daily.   buPROPion (WELLBUTRIN  XL) 300 MG 24 hr tablet TAKE 1 TABLET BY MOUTH DAILY   cyclobenzaprine (FLEXERIL) 10 MG tablet Take 1 tablet (10 mg total) by mouth 2 (two) times daily as needed for muscle spasms.   Diclofenac Sodium (PENNSAID) 2 % SOLN Place 1 application onto the skin 2 (two) times daily.   ESTRACE VAGINAL 0.1 MG/GM vaginal cream Place 1 application vaginally at bedtime.   estradiol (ESTRACE) 0.5 MG tablet Take 0.5 mg by mouth daily.   FLUoxetine (PROZAC) 40 MG capsule Take 1 capsule (40 mg total) by mouth daily.   isosorbide mononitrate (IMDUR) 30 MG 24 hr tablet Take 1 tablet (30 mg total) by mouth daily.   lidocaine (LIDODERM) 5 % Place 1 patch onto the skin daily. Remove & Discard patch within 12 hours or as directed by MD   medroxyPROGESTERone (PROVERA) 2.5 MG tablet Take 1 tablet by mouth daily.   Multiple Vitamins-Minerals (MULTIVITAMIN WITH MINERALS) tablet 1 tablet.   naproxen (NAPROSYN) 250 MG tablet Take 2 tablets (500 mg total) by mouth 2 (two) times daily with a meal. As needed after completing prednisone   nitroGLYCERIN (NITRODUR - DOSED IN MG/24 HR) 0.4 mg/hr patch Place 0.4 mg onto the skin daily. Cut and apply 1/4 of the patch to the most painful area.   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    Employer: [employer]  Tobacco Use   Smoking status: Former    Types: Cigarettes    Quit date: 07/04/1985    Years since quitting: 35.7   Smokeless tobacco: Never  Vaping Use   Vaping Use: Never used  Substance and Sexual Activity   Alcohol use: Yes    Alcohol/week:  0.0 standard drinks    Comment: 4-5 times a week   Drug use: No   Sexual activity: Not on file  Other Topics Concern   Not on file  Social History Narrative   Regular exercise: 30 minutes 5 days a week   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      Social Determinants of Health   Financial Resource Strain: Not on file  Food Insecurity: Not on file  Transportation Needs: Not on file  Physical Activity: Not on file  Stress: Not on file  Social Connections: Not on file     Family History: The patient's family history includes Alcohol abuse in her brother; Anxiety disorder in her son; Breast cancer (age of onset: 34) in her mother; Depression in her son; Diabetes in her father and mother; Healthy in her daughter and son; Heart disease in her father and mother; Hepatitis in her brother; Raynaud syndrome in her sister; Stroke in her father.  ROS:   Review of Systems  Constitution: Negative for decreased appetite, fever and weight gain.  HENT: Negative for congestion, ear discharge, hoarse voice and sore throat.   Eyes: Negative for discharge, redness, vision loss in right eye and visual halos.  Cardiovascular: Reports chest pain.negative for dyspnea on exertion, leg swelling, orthopnea and palpitations.  Respiratory: Negative for cough, hemoptysis, shortness of breath and snoring.   Endocrine: Negative for heat intolerance and polyphagia.  Hematologic/Lymphatic: Negative for bleeding problem. Does not bruise/bleed easily.  Skin: Negative for flushing, nail changes, rash and suspicious lesions.  Musculoskeletal: Negative for arthritis, joint pain, muscle cramps, myalgias, neck pain and stiffness.  Gastrointestinal: Negative for abdominal pain, bowel incontinence, diarrhea and excessive appetite.  Genitourinary: Negative for decreased libido,  genital sores and incomplete emptying.  Neurological: Negative for brief paralysis, focal weakness, headaches and loss of balance.  Psychiatric/Behavioral: Negative for altered mental status, depression and suicidal ideas.  Allergic/Immunologic: Negative for HIV exposure and persistent infections.    EKGs/Labs/Other Studies Reviewed:    The following studies were reviewed today:   EKG:  The ekg ordered today demonstrates sinus rhythm, heart rate 70 bpm with ST segment changes in the anterior leads compared to EKG done in April 2021 no significant change.  CCTA IMPRESSION: 1. Coronary calcium score of 53. This was 87th percentile for age and sex matched control.   2.  Normal coronary origin with right dominance.   3.  Mild atherosclerosis of the distal LM and mid LAD.  CAD-RADS=1.   4.  Consider non-atherosclerotic causes  of chest pain.   5.  Recommend preventive therapy and risk factor modification.   6.  This study has been submitted for FFR analysis.    Recent Labs: No results found for requested labs within last 8760 hours.  Recent Lipid Panel    Component Value Date/Time   CHOL 192 12/25/2020 0831   CHOL 264 (H) 01/29/2020 0809   TRIG 203.0 (H) 12/25/2020 0831   HDL 41.60 12/25/2020 0831   HDL 60 01/29/2020 0809   CHOLHDL 5 12/25/2020 0831   VLDL 40.6 (H) 12/25/2020 0831   LDLCALC 146 (H) 09/23/2020 0816   LDLCALC 139 (H) 04/06/2020 0838   LDLDIRECT 103.0 12/25/2020 0831    Physical Exam:    VS:  BP 116/84 (BP Location: Right Arm, Patient Position: Sitting, Cuff Size: Normal)   Pulse 73   Ht '5\' 3"'$  (1.6 m)   Wt 167 lb (75.8 kg)   LMP 07/04/2004   SpO2 94%   BMI 29.58 kg/m     Wt Readings from Last 3 Encounters:  03/11/21 167 lb (75.8 kg)  12/25/20 165 lb (74.8 kg)  10/26/20 167 lb (75.8 kg)     GEN: Well nourished, well developed in no acute distress HEENT: Normal NECK: No JVD; No carotid bruits LYMPHATICS: No lymphadenopathy CARDIAC: S1S2  noted,RRR, no murmurs, rubs, gallops RESPIRATORY:  Clear to auscultation without rales, wheezing or rhonchi  ABDOMEN: Soft, non-tender, non-distended, +bowel sounds, no guarding. EXTREMITIES: No edema, No cyanosis, no clubbing MUSCULOSKELETAL:  No deformity  SKIN: Warm and dry NEUROLOGIC:  Alert and oriented x 3, non-focal PSYCHIATRIC:  Normal affect, good insight  ASSESSMENT:    1. Coronary artery disease involving native coronary artery of native heart, unspecified whether angina present   2. Mixed hyperlipidemia    PLAN:     1.  She does have nonobstructive coronary artery disease.  Like to start the patient on low-dose Imdur to help with her symptoms for angina.  She will remain on her aspirin 81 mg daily along with her atorvastatin 40 mg daily.  2.  Hyperlipidemia - continue with current statin medication.  We will continue to monitor triglyceride this was slightly elevated she is watching her diet.  If this does not improveI would like to consider adding fenofibrate.   The patient is in agreement with the above plan. The patient left the office in stable condition.  The patient will follow up in 1 year.   Medication Adjustments/Labs and Tests Ordered: Current medicines are reviewed at length with the patient today.  Concerns regarding medicines are outlined above.  Orders Placed This Encounter  Procedures   EKG 12-Lead   Meds ordered this encounter  Medications   isosorbide mononitrate (IMDUR) 30 MG 24 hr tablet    Sig: Take 1 tablet (30 mg total) by mouth daily.    Dispense:  90 tablet    Refill:  3    Patient Instructions  Medication Instructions:  Your physician has recommended you make the following change in your medication:  START: Imur 30 mg once daily *If you need a refill on your cardiac medications before your next appointment, please call your pharmacy*   Lab Work: None If you have labs (blood work) drawn today and your tests are completely normal, you  will receive your results only by: [place] (if you have MyChart) OR A paper copy in the mail If you have any lab test that is abnormal or we need to change your treatment, we  will call you to review the results.   Testing/Procedures: None   Follow-Up: At [facility], you and your health needs are our priority.  As part of our continuing mission to provide you with exceptional heart care, we have created designated Provider Care Teams.  These Care Teams include your primary Cardiologist (physician) and Advanced Practice Providers (APPs -  Physician Assistants and Nurse Practitioners) who all work together to provide you with the care you need, when you need it.  We recommend signing up for the patient portal called "MyChart".  Sign up information is provided on this After Visit Summary.  MyChart is used to connect with patients for Virtual Visits (Telemedicine).  Patients are able to view lab/test results, encounter notes, upcoming appointments, etc.  Non-urgent messages can be sent to your provider as well.   To learn more about what you can do with MyChart, go to [email or web address].    Your next appointment:   1 year(s)  The format for your next appointment:   In Person  Provider:   Berniece Salines, DO [address], [place], [place] [address]    Other Instructions     Adopting a Healthy Lifestyle.  Know what a healthy weight is for you (roughly BMI <25) and aim to maintain this   Aim for 7+ servings of fruits and vegetables daily   65-80+ fluid ounces of water or unsweet tea for healthy kidneys   Limit to max 1 drink of alcohol per day; avoid smoking/tobacco   Limit animal fats in diet for cholesterol and heart health - choose grass fed whenever available   Avoid highly processed foods, and foods high in saturated/trans fats   Aim for low stress - take time to unwind and care for your mental health   Aim for 150 min of moderate intensity exercise  weekly for heart health, and weights twice weekly for bone health   Aim for 7-9 hours of sleep daily   When it comes to diets, agreement about the perfect plan isnt easy to find, even among the experts. Experts at the [place] developed an idea known as the Healthy Eating Plate. Just imagine a plate divided into logical, healthy portions.   The emphasis is on diet quality:   Load up on vegetables and fruits - one-half of your plate: Aim for color and variety, and remember that potatoes dont count.   Go for whole grains - one-quarter of your plate: Whole wheat, barley, wheat berries, quinoa, oats, brown rice, and foods made with them. If you want pasta, go with whole wheat pasta.   Protein power - one-quarter of your plate: Fish, chicken, beans, and nuts are all healthy, versatile protein sources. Limit red meat.   The diet, however, does go beyond the plate, offering a few other suggestions.   Use healthy plant oils, such as olive, canola, soy, corn, sunflower and peanut. Check the labels, and avoid partially hydrogenated oil, which have unhealthy trans fats.   If youre thirsty, drink water. Coffee and tea are good in moderation, but skip sugary drinks and limit milk and dairy products to one or two daily servings.   The type of carbohydrate in the diet is more important than the amount. Some sources of carbohydrates, such as vegetables, fruits, whole grains, and beans-are healthier than others.   Finally, stay active  Signed, Berniece Salines, DO  03/11/2021 11:33 AM    [place]

## 2021-03-15 ENCOUNTER — Other Ambulatory Visit: Payer: Self-pay | Admitting: Family

## 2021-03-15 NOTE — Telephone Encounter (Signed)
Next appointment 07-06-2021

## 2021-03-16 ENCOUNTER — Encounter: Payer: Self-pay | Admitting: Family

## 2021-03-16 MED ORDER — PANTOPRAZOLE SODIUM 40 MG PO TBEC: 40.0000 mg | DELAYED_RELEASE_TABLET | Freq: Every day | ORAL | 1 refills | Status: DC

## 2021-03-17 ENCOUNTER — Other Ambulatory Visit: Payer: Self-pay | Admitting: Cardiology

## 2021-05-31 ENCOUNTER — Other Ambulatory Visit (INDEPENDENT_AMBULATORY_CARE_PROVIDER_SITE_OTHER): Payer: BC Managed Care – PPO

## 2021-05-31 ENCOUNTER — Telehealth: Payer: Self-pay | Admitting: Family

## 2021-05-31 ENCOUNTER — Ambulatory Visit: Payer: BC Managed Care – PPO | Admitting: Family

## 2021-05-31 ENCOUNTER — Other Ambulatory Visit: Payer: Self-pay

## 2021-05-31 VITALS — BP 143/77 | HR 65 | Temp 97.9°F | Resp 16 | Ht 63.0 in | Wt 169.0 lb

## 2021-05-31 DIAGNOSIS — R1013 Epigastric pain: Secondary | ICD-10-CM | POA: Diagnosis not present

## 2021-05-31 DIAGNOSIS — K5900 Constipation, unspecified: Secondary | ICD-10-CM

## 2021-05-31 DIAGNOSIS — R079 Chest pain, unspecified: Secondary | ICD-10-CM | POA: Diagnosis not present

## 2021-05-31 DIAGNOSIS — E785 Hyperlipidemia, unspecified: Secondary | ICD-10-CM

## 2021-05-31 DIAGNOSIS — D649 Anemia, unspecified: Secondary | ICD-10-CM | POA: Diagnosis not present

## 2021-05-31 LAB — COMPREHENSIVE METABOLIC PANEL
ALT: 19 U/L (ref 0–35)
AST: 18 U/L (ref 0–37)
Albumin: 4.3 g/dL (ref 3.5–5.2)
Alkaline Phosphatase: 69 U/L (ref 39–117)
BUN: 14 mg/dL (ref 6–23)
CO2: 27 mEq/L (ref 19–32)
Calcium: 9.4 mg/dL (ref 8.4–10.5)
Chloride: 105 mEq/L (ref 96–112)
Creatinine, Ser: 0.75 mg/dL (ref 0.40–1.20)
GFR: 86.57 mL/min (ref >60.00)
Glucose, Bld: 95 mg/dL (ref 70–99)
Potassium: 3.9 mEq/L (ref 3.5–5.1)
Sodium: 140 mEq/L (ref 135–145)
Total Bilirubin: 0.6 mg/dL (ref 0.2–1.2)
Total Protein: 6.6 g/dL (ref 6.0–8.3)

## 2021-05-31 LAB — CBC WITH DIFFERENTIAL/PLATELET
Basophils Absolute: 0 10*3/uL (ref 0.0–0.1)
Basophils Relative: 0.5 % (ref 0.0–3.0)
Eosinophils Absolute: 0.3 10*3/uL (ref 0.0–0.7)
Eosinophils Relative: 4.6 % (ref 0.0–5.0)
HCT: 33.8 % — ABNORMAL LOW (ref 36.0–46.0)
Hemoglobin: 11.4 g/dL — ABNORMAL LOW (ref 12.0–15.0)
Lymphocytes Relative: 31.3 % (ref 12.0–46.0)
Lymphs Abs: 1.9 10*3/uL (ref 0.7–4.0)
MCHC: 33.8 g/dL (ref 30.0–36.0)
MCV: 93.8 fl (ref 78.0–100.0)
Monocytes Absolute: 0.3 10*3/uL (ref 0.1–1.0)
Monocytes Relative: 5.2 % (ref 3.0–12.0)
Neutro Abs: 3.5 10*3/uL (ref 1.4–7.7)
Neutrophils Relative %: 58.4 % (ref 43.0–77.0)
Platelets: 248 10*3/uL (ref 150.0–400.0)
RBC: 3.6 Mil/uL — ABNORMAL LOW (ref 3.87–5.11)
RDW: 13.4 % (ref 11.5–15.5)
WBC: 6 10*3/uL (ref 4.0–10.5)

## 2021-05-31 LAB — D-DIMER, QUANTITATIVE: D-Dimer, Quant: <0.19 mcg/mL FEU (ref <0.50)

## 2021-05-31 LAB — VITAMIN B12: Vitamin B-12: 427 pg/mL (ref 211–911)

## 2021-05-31 LAB — LIPASE: Lipase: 15 U/L (ref 11.0–59.0)

## 2021-05-31 LAB — LIPID PANEL
Cholesterol: 209 mg/dL — ABNORMAL HIGH (ref 0–200)
HDL: 56.2 mg/dL (ref >39.00)
LDL Cholesterol: 123 mg/dL — ABNORMAL HIGH (ref 0–99)
NonHDL: 152.5
Total CHOL/HDL Ratio: 4
Triglycerides: 149 mg/dL (ref 0.0–149.0)
VLDL: 29.8 mg/dL (ref 0.0–40.0)

## 2021-05-31 LAB — TROPONIN I (HIGH SENSITIVITY): High Sens Troponin I: 3 ng/L (ref 2–17)

## 2021-05-31 LAB — FOLATE: Folate: >23.4 ng/mL (ref >5.9)

## 2021-05-31 LAB — IRON: Iron: 77 ug/dL (ref 42–145)

## 2021-05-31 LAB — FERRITIN: Ferritin: 65.3 ng/mL (ref 10.0–291.0)

## 2021-05-31 NOTE — Assessment & Plan Note (Addendum)
Chronic.  EKG tracing is personally reviewed.  EKG notes NSR.  No acute changes.  Continue aspirin 81mg . Will obtain labs as ordered. Refer back to cardiology. Pt is advised to got to the ER if severe/worsening chest pain or shortness of breath. Pt verbalizes understanding.

## 2021-05-31 NOTE — Patient Instructions (Addendum)
Please complete lab work prior to leaving. You should be contacted about your follow up appointment with Dr. Harriet Masson and Dr. Ardis Hughs. Go to the ER if severe/worsening chest pain or shortness of breath.

## 2021-05-31 NOTE — Telephone Encounter (Signed)
Please notify pt that her heart enzyme and screening for blood clot are negative. Good news. I would still like her to follow up with cardiology.  She is mildly anemic. Can you see if lab can add on serum iron, ferritin, b12 and folate please?

## 2021-05-31 NOTE — Assessment & Plan Note (Signed)
Continue PPI, refer back to GI for further evaluation. Check LFT, lipase.

## 2021-05-31 NOTE — Assessment & Plan Note (Signed)
Recommended that she add miralax once daily as needed and titrate dose to achieve one soft BM.

## 2021-05-31 NOTE — Progress Notes (Signed)
Subjective:     Patient ID: Haley Fuentes, female    DOB: 07/05/1960, 60 y.o.   MRN: [id number]  Chief Complaint  Patient presents with   Abdominal Pain    Complains of epigastric pain    Chest Pain    Complains of chest pain, feels like acid reflux. Worse with eating.     HPI  Reports that she has been having belching and stomach pain.  Also feels like she has some sob with activity. She reports that she has been having chest pain, which has not improved despite increasing her protonix from once daily to twice daily. Reports that she feels like she has to catch her breath after she exerts herself.  She does report a great deal of stress and anxiety recently. She recently saw her cardiologist back in early September and she placed her on imdur for her c/o chest pain.   She does note some constipation- had a small hard BM yesterday which caused her hemorrhoids to bleed.   Health Maintenance Due  Topic Date Due   Pneumococcal Vaccine 48-74 Years old (1 - PCV) Never done   URINE MICROALBUMIN  Never done   HIV Screening  Never done   PAP SMEAR-Modifier  03/22/2020   COVID-19 Vaccine (4 - Booster for [employer] series) 08/25/2020   INFLUENZA VACCINE  02/01/2021    Past Medical History:  Diagnosis Date   Anemia    hx of iron deficient anemia   Arthritis    Blood transfusion without reported diagnosis    Depression    Gastric polyp    GERD (gastroesophageal reflux disease)    Hemorrhoids    HLD (hyperlipidemia)    Joint pain    left shoulder   Kidney lesion    right   Osteopenia 05/04/2017   Postmenopausal     Past Surgical History:  Procedure Laterality Date   ENDOMETRIAL ABLATION  2007   Novasure   TUBAL LIGATION  1983    Family History  Problem Relation Age of Onset   Diabetes Mother    Heart disease Mother    Breast cancer Mother 60   Stroke Father    Diabetes Father    Heart disease Father        CABG, AVR, A-Fib   Hepatitis Brother    Alcohol abuse Brother     Raynaud syndrome Sister    Healthy Daughter    Depression Son    Anxiety disorder Son    Healthy Son     Social History   Socioeconomic History   Marital status: Married    Spouse name: Not on file   Number of children: 2   Years of education: Not on file   Highest education level: Not on file  Occupational History   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    Employer: [employer]  Tobacco Use   Smoking status: Former    Types: Cigarettes    Quit date: 07/04/1985    Years since quitting: 35.9   Smokeless tobacco: Never  Vaping Use   Vaping Use: Never used  Substance and Sexual Activity   Alcohol use: Yes    Alcohol/week: 0.0 standard drinks    Comment: 4-5 times a week   Drug use: No   Sexual activity: Not on file  Other Topics Concern   Not on file  Social History Narrative   Regular exercise: 30 minutes 5 days a week   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      Social Determinants of Health   Financial Resource Strain: Not on file  Food Insecurity: Not on file  Transportation Needs: Not on file  Physical Activity: Not on file  Stress: Not on file  Social Connections: Not on file  Intimate Partner Violence: Not on file    Outpatient Medications Prior to Visit  Medication Sig Dispense Refill   ASPIRIN LOW DOSE 81 MG EC tablet TAKE ONE TABLET BY MOUTH DAILY *SWALLOW WHOLE 90 tablet 3   atorvastatin (LIPITOR) 40 MG tablet Take 40 mg by mouth daily.     buPROPion (WELLBUTRIN XL) 300 MG 24 hr tablet TAKE 1 TABLET BY MOUTH DAILY 90 tablet 1   Diclofenac Sodium (PENNSAID) 2 % SOLN Place 1 application onto the skin 2 (two) times daily. 112 g 3   ESTRACE VAGINAL 0.1 MG/GM vaginal cream Place 1 application vaginally at bedtime.  1   estradiol (ESTRACE) 0.5 MG tablet Take 0.5 mg by mouth daily.     FLUoxetine (PROZAC) 40 MG  capsule TAKE ONE CAPSULE BY MOUTH DAILY 90 capsule 1   isosorbide mononitrate (IMDUR) 30 MG 24 hr tablet Take 1 tablet (30 mg total) by mouth daily. 90 tablet 3   lidocaine (LIDODERM) 5 % Place 1 patch onto the skin daily. Remove & Discard patch within 12 hours or as directed by MD 30 patch 0   medroxyPROGESTERone (PROVERA) 2.5 MG tablet Take 1 tablet by mouth daily.     Multiple Vitamins-Minerals (MULTIVITAMIN WITH MINERALS) tablet 1 tablet.     nitroGLYCERIN (NITRODUR - DOSED IN MG/24 HR) 0.4 mg/hr patch Place 0.4 mg onto the skin daily. Cut and apply 1/4 of the patch to the most painful area.     pantoprazole (PROTONIX) 40 MG tablet Take 1 tablet (40 mg total) by mouth daily. 90 tablet 1   zolpidem (AMBIEN) 10 MG tablet TAKE ONE HALF TO ONE TABLET BY MOUTH EVERY NIGHT AT BEDTIME AS NEEDED FOR SLEEP 15 tablet 0   cyclobenzaprine (FLEXERIL) 10 MG tablet Take 1 tablet (10 mg total) by mouth 2 (two) times daily as needed for muscle spasms. 180 tablet 0   naproxen (NAPROSYN) 250 MG tablet Take 2 tablets (500 mg total) by mouth 2 (two) times daily with a meal. As needed after completing prednisone 30 tablet 0   No facility-administered medications prior to visit.    Allergies  Allergen Reactions   Hydrocodone-Acetaminophen     "made her skin crawl"   Hydrocodone-Acetaminophen Itching   Omni-Pac     vomitting   Voltaren [Diclofenac]     diarrhea    ROS See mychart    Objective:    Physical Exam Constitutional:      General: She is not in acute distress.    Appearance: Normal appearance. She is well-developed.  HENT:     Head: Normocephalic and atraumatic.     Right Ear: External ear normal.     Left Ear: External ear normal.  Eyes:     General: No scleral icterus. Neck:     Thyroid: No thyromegaly.  Cardiovascular:     Rate and Rhythm: Normal rate and regular rhythm.     Heart sounds: Normal heart sounds. No murmur heard. Pulmonary:     Effort: Pulmonary effort is normal. No  respiratory distress.     Breath  sounds: Normal breath sounds. No wheezing.  Abdominal:     Tenderness: There is abdominal tenderness in the epigastric area.  Musculoskeletal:     Cervical back: Neck supple.  Skin:    General: Skin is warm and dry.  Neurological:     Mental Status: She is alert and oriented to person, place, and time.  Psychiatric:        Mood and Affect: Mood normal.        Behavior: Behavior normal.        Thought Content: Thought content normal.        Judgment: Judgment normal.    BP (!) 143/77 (BP Location: Right Arm, Patient Position: Sitting, Cuff Size: Small)   Pulse 65   Temp 97.9 F (36.6 C) (Oral)   Resp 16   Ht '5\' 3"'  (1.6 m)   Wt 169 lb (76.7 kg)   LMP 07/04/2004   SpO2 100%   BMI 29.94 kg/m  Wt Readings from Last 3 Encounters:  05/31/21 169 lb (76.7 kg)  03/11/21 167 lb (75.8 kg)  12/25/20 165 lb (74.8 kg)       Assessment & Plan:   Problem List Items Addressed This Visit       Unprioritized   Hyperlipidemia   Relevant Orders   Lipid panel   Epigastric pain - Primary    Continue PPI, refer back to GI for further evaluation. Check LFT, lipase.       Relevant Orders   Ambulatory referral to Gastroenterology   Constipation    Recommended that she add miralax once daily as needed and titrate dose to achieve one soft BM.       Chest pain    Chronic.  EKG tracing is personally reviewed.  EKG notes NSR.  No acute changes.  Continue aspirin 67m. Will obtain labs as ordered. Refer back to cardiology. Pt is advised to got to the ER if severe/worsening chest pain or shortness of breath. Pt verbalizes understanding.       Relevant Orders   EKG 12-Lead (Completed)   Comp Met (CMET)   CBC with Differential/Platelet   Lipase   D-Dimer, Quantitative   Ambulatory referral to Cardiology   Troponin I (High Sensitivity)    I have discontinued Haley Fuentes's cyclobenzaprine and naproxen. I am also having her maintain her estradiol,  multivitamin with minerals, medroxyPROGESTERone, ESTRACE VAGINAL, nitroGLYCERIN, Pennsaid, zolpidem, lidocaine, buPROPion, atorvastatin, isosorbide mononitrate, FLUoxetine, pantoprazole, and Aspirin Low Dose.  No orders of the defined types were placed in this encounter.

## 2021-05-31 NOTE — Telephone Encounter (Signed)
Patient advised of results and provider's comments. Order faxed to [facility] to add test.

## 2021-06-01 ENCOUNTER — Other Ambulatory Visit: Payer: Self-pay

## 2021-06-01 ENCOUNTER — Encounter: Payer: Self-pay | Admitting: Family

## 2021-06-01 ENCOUNTER — Telehealth: Payer: Self-pay | Admitting: Family

## 2021-06-01 DIAGNOSIS — D649 Anemia, unspecified: Secondary | ICD-10-CM

## 2021-06-01 NOTE — Telephone Encounter (Signed)
Patient advised and she will pick up ifob kit today. Test ordered as future.

## 2021-06-01 NOTE — Telephone Encounter (Signed)
Since she is mildly anemic, I would like her to complete an IFOB please. Dx anemia.

## 2021-06-02 ENCOUNTER — Encounter: Payer: Self-pay | Admitting: Cardiology

## 2021-06-02 ENCOUNTER — Other Ambulatory Visit (INDEPENDENT_AMBULATORY_CARE_PROVIDER_SITE_OTHER): Payer: BC Managed Care – PPO

## 2021-06-02 DIAGNOSIS — D649 Anemia, unspecified: Secondary | ICD-10-CM | POA: Diagnosis not present

## 2021-06-02 LAB — FECAL OCCULT BLOOD, IMMUNOCHEMICAL: Fecal Occult Bld: NEGATIVE

## 2021-06-03 ENCOUNTER — Other Ambulatory Visit: Payer: Self-pay

## 2021-06-03 ENCOUNTER — Ambulatory Visit: Payer: BC Managed Care – PPO | Admitting: Cardiology

## 2021-06-03 ENCOUNTER — Encounter: Payer: Self-pay | Admitting: Cardiology

## 2021-06-03 VITALS — BP 106/62 | HR 78 | Ht 63.0 in | Wt 168.0 lb

## 2021-06-03 DIAGNOSIS — R400 Somnolence: Secondary | ICD-10-CM | POA: Diagnosis not present

## 2021-06-03 DIAGNOSIS — I25118 Atherosclerotic heart disease of native coronary artery with other forms of angina pectoris: Secondary | ICD-10-CM | POA: Diagnosis not present

## 2021-06-03 DIAGNOSIS — R5383 Other fatigue: Secondary | ICD-10-CM

## 2021-06-03 DIAGNOSIS — R0602 Shortness of breath: Secondary | ICD-10-CM | POA: Diagnosis not present

## 2021-06-03 DIAGNOSIS — R0683 Snoring: Secondary | ICD-10-CM

## 2021-06-03 MED ORDER — EZETIMIBE 10 MG PO TABS: 10.0000 mg | ORAL_TABLET | Freq: Every day | ORAL | 3 refills | Status: DC

## 2021-06-03 MED ORDER — RANOLAZINE ER 500 MG PO TB12: 500.0000 mg | ORAL_TABLET | Freq: Two times a day (BID) | ORAL | 3 refills | Status: DC

## 2021-06-03 NOTE — Patient Instructions (Signed)
Medication Instructions:  Your physician has recommended you make the following change in your medication:  STOP: Imdur  START: Ranexa 500 mg take twice daily  START: Zetia 10 mg once daily *If you need a refill on your cardiac medications before your next appointment, please call your pharmacy*   Lab Work: Your physician recommends that you return for lab work in: In 8 weeks: Lipids, Lp(a) If you have labs (blood work) drawn today and your tests are completely normal, you will receive your results only by: [place] (if you have MyChart) OR A paper copy in the mail If you have any lab test that is abnormal or we need to change your treatment, we will call you to review the results.   Testing/Procedures: Your physician has requested that you have an echocardiogram. Echocardiography is a painless test that uses sound waves to create images of your heart. It provides your doctor with information about the size and shape of your heart and how well your heart's chambers and valves are working. This procedure takes approximately one hour. There are no restrictions for this procedure.  Your physician has recommended that you have a sleep study. This test records several body functions during sleep, including: brain activity, eye movement, oxygen and carbon dioxide blood levels, heart rate and rhythm, breathing rate and rhythm, the flow of air through your mouth and nose, snoring, body muscle movements, and chest and belly movement.    Follow-Up: At [facility], you and your health needs are our priority.  As part of our continuing mission to provide you with exceptional heart care, we have created designated Provider Care Teams.  These Care Teams include your primary Cardiologist (physician) and Advanced Practice Providers (APPs -  Physician Assistants and Nurse Practitioners) who all work together to provide you with the care you need, when you need it.  We recommend signing up for the  patient portal called "MyChart".  Sign up information is provided on this After Visit Summary.  MyChart is used to connect with patients for Virtual Visits (Telemedicine).  Patients are able to view lab/test results, encounter notes, upcoming appointments, etc.  Non-urgent messages can be sent to your provider as well.   To learn more about what you can do with MyChart, go to [email or web address].    Your next appointment:   6 month(s)  The format for your next appointment:   In Person  Provider:   Berniece Salines, DO     Other Instructions Echocardiogram  An echocardiogram is a test that uses sound waves (ultrasound) to produce images of the heart. Images from an echocardiogram can provide important information about: Heart size and shape. The size and thickness and movement of your heart's walls. Heart muscle function and strength. Heart valve function or if you have stenosis. Stenosis is when the heart valves are too narrow. If blood is flowing backward through the heart valves (regurgitation). A tumor or infectious growth around the heart valves. Areas of heart muscle that are not working well because of poor blood flow or injury from a heart attack. Aneurysm detection. An aneurysm is a weak or damaged part of an artery wall. The wall bulges out from the normal force of blood pumping through the body. Tell a health care provider about: Any allergies you have. All medicines you are taking, including vitamins, herbs, eye drops, creams, and over-the-counter medicines. Any blood disorders you have. Any surgeries you have had. Any medical conditions you have. Whether you are  pregnant or may be pregnant. What are the risks? Generally, this is a safe test. However, problems may occur, including an allergic reaction to dye (contrast) that may be used during the test. What happens before the test? No specific preparation is needed. You may eat and drink normally. What happens during  the test?  You will take off your clothes from the waist up and put on a hospital gown. Electrodes or electrocardiogram (ECG)patches may be placed on your chest. The electrodes or patches are then connected to a device that monitors your heart rate and rhythm. You will lie down on a table for an ultrasound exam. A gel will be applied to your chest to help sound waves pass through your skin. A handheld device, called a transducer, will be pressed against your chest and moved over your heart. The transducer produces sound waves that travel to your heart and bounce back (or "echo" back) to the transducer. These sound waves will be captured in real-time and changed into images of your heart that can be viewed on a video monitor. The images will be recorded on a computer and reviewed by your health care provider. You may be asked to change positions or hold your breath for a short time. This makes it easier to get different views or better views of your heart. In some cases, you may receive contrast through an IV in one of your veins. This can improve the quality of the pictures from your heart. The procedure may vary among health care providers and hospitals. What can I expect after the test? You may return to your normal, everyday life, including diet, activities, and medicines, unless your health care provider tells you not to do that. Follow these instructions at home: It is up to you to get the results of your test. Ask your health care provider, or the department that is doing the test, when your results will be ready. Keep all follow-up visits. This is important. Summary An echocardiogram is a test that uses sound waves (ultrasound) to produce images of the heart. Images from an echocardiogram can provide important information about the size and shape of your heart, heart muscle function, heart valve function, and other possible heart problems. You do not need to do anything to prepare before this  test. You may eat and drink normally. After the echocardiogram is completed, you may return to your normal, everyday life, unless your health care provider tells you not to do that. This information is not intended to replace advice given to you by your health care provider. Make sure you discuss any questions you have with your health care provider. Document Revised: 03/03/2021 Document Reviewed: 02/11/2020 Elsevier Patient Education  [place].    Sleep Study, Adult  A sleep study (polysomnogram) is a series of tests done while you are sleeping. A sleep study records your brain waves, heart rate, breathing rate, oxygen level, and eye and leg movements. A sleep study helps your health care provider: See how well you sleep. Diagnose a sleep disorder. Determine how severe your sleep disorder is. Create a plan to treat your sleep disorder. Your health care provider may recommend a sleep study if you: Feel sleepy on most days. Snore loudly while sleeping. Have unusual behaviors while you sleep, such as walking. Have brief periods in which you stop breathing during sleep (sleepapnea). Fall asleep suddenly during the day (narcolepsy). Have trouble falling asleep or staying asleep (insomnia). Feel like you need to move  your legs when trying to fall asleep (restless legs syndrome). Move your legs by flexing and extending them regularly while asleep (periodic limb movement disorder). Act out your dreams while you sleep (sleep behavior disorder). Feel like you cannot move when you first wake up (sleep paralysis). What tests are part of a sleep study? Most sleep studies record the following during sleep: Brain activity. Eye movements. Heart rate and rhythm. Breathing rate and rhythm. Blood-oxygen level. Blood pressure. Chest and belly movement as you breathe. Arm and leg movements. Snoring or other noises. Body position. Where are sleep studies done? Sleep studies are done at  sleep centers. A sleep center may be inside a hospital, office, or clinic. The room where you have the study may look like a hospital room or a hotel room. The health care providers doing the study may come in and out of the room during the study. Most of the time, they will be in another room monitoring your test as you sleep. How are sleep studies done? Most sleep studies are done during a normal period of time for a full night of sleep. You will arrive at the study center in the evening and go home in the morning. Before the test Bring your pajamas and toothbrush with you to the sleep study. Do not have caffeine on the day of your sleep study. Do not drink alcohol on the day of your sleep study. Your health care provider will let you know if you should stop taking any of your regular medicines before the test. During the test   Round, sticky patches with sensors attached to recording wires (electrodes) are placed on your scalp, face, chest, and limbs. Wires from all the electrodes and sensors run from your bed to a computer. The wires can be taken off and put back on if you need to get out of bed to go to the bathroom. A sensor is placed over your nose to measure airflow. A finger clip is put on your finger or ear to measure your blood oxygen level (pulse oximetry). A belt is placed around your belly and a belt is placed around your chest to measure breathing movements. If you have signs of the sleep disorder called sleep apnea during your test, you may get a treatment mask to wear for the second half of the night. The mask provides positive airway pressure (PAP) to help you breathe better during sleep. This may greatly improve your sleep apnea. You will then have all tests done again with the mask in place to see if your measurements and recordings change. After the test A medical doctor who specializes in sleep will evaluate the results of your sleep study and share them with you and your  primary health care provider. Based on your results, your medical history, and a physical exam, you may be diagnosed with a sleep disorder, such as: Sleep apnea. Restless legs syndrome. Sleep-related behavior disorder. Sleep-related movement disorders. Sleep-related seizure disorders. Your health care team will help determine your treatment options based on your diagnosis. This may include: Improving your sleep habits (sleep hygiene). Wearing a continuous positive airway pressure (CPAP) or bi-level positive airway pressure (BPAP) mask. Wearing an oral device at night to improve breathing and reduce snoring. Taking medicines. Follow these instructions at home: Take over-the-counter and prescription medicines only as told by your health care provider. If you are instructed to use a CPAP or BPAP mask, make sure you use it nightly as directed. Make any  lifestyle changes that your health care provider recommends. If you were given a device to open your airway while you sleep, use it only as told by your health care provider. Do not use any tobacco products, such as cigarettes, chewing tobacco, and e-cigarettes. If you need help quitting, ask your health care provider. Keep all follow-up visits as told by your health care provider. This is important. Summary A sleep study (polysomnogram) is a series of tests done while you are sleeping. It shows how well you sleep. Most sleep studies are done over one full night of sleep. You will arrive at the study center in the evening and go home in the morning. If you have signs of the sleep disorder called sleep apnea during your test, you may get a treatment mask to wear for the second half of the night. A medical doctor who specializes in sleep will evaluate the results of your sleep study and share them with your primary health care provider. This information is not intended to replace advice given to you by your health care provider. Make sure you discuss  any questions you have with your health care provider. Document Revised: 01/27/2021 Document Reviewed: 07/18/2017 Elsevier Patient Education  [place].

## 2021-06-03 NOTE — Progress Notes (Signed)
Cardiology Office Note:    Date:  06/03/2021   ID:  Haley Fuentes, DOB [birth year], MRN [id number]  PCP:  [name], NP  Cardiologist:  Berniece Salines, DO  Electrophysiologist:  None   Referring MD: [name], NP   Chief Complaint  Patient presents with   Follow-up   Chest Pain   Shortness of Breath    History of Present Illness:    Haley Fuentes is a 60 y.o. female with a hx of  hyperlipidemia, nonobstructive coronary artery disease seen on coronary CTA, hyperlipidemia is here today for follow-up visit. I saw the patient on October 04, 2019 at that time she complained of precordial chest pain given her family history I recommended she undergo coronary CTA.   I saw the patient on January 24, 2020 at that time we discussed her testing results which show evidence of coronary artery disease we will increase her statin to moderate intensity statin  I saw the patient on March 11, 2021 at that time she had been experiencing some intermittent chest discomfort.  I started the patient on Imdur 30 mg daily.  Continue her aspirin and atorvastatin.  Since her visit she tells me that the chest pain has improved but has been intermittent as well.  She also notes that she has had worsening shortness of breath, significant fatigue daytime somnolence and noted that her snoring also has increased.  The patient tells me that although she has had some improvement with the chest discomfort she has had significant headaches with the Imdur.  Past Medical History:  Diagnosis Date   Anemia    hx of iron deficient anemia   Arthritis    Blood transfusion without reported diagnosis    Depression    Gastric polyp    GERD (gastroesophageal reflux disease)    Hemorrhoids    HLD (hyperlipidemia)    Joint pain    left shoulder   Kidney lesion    right   Osteopenia 05/04/2017   Postmenopausal     Past Surgical History:  Procedure Laterality Date   ENDOMETRIAL ABLATION  2007   [facility]    TUBAL LIGATION  1983    Current Medications: Current Meds  Medication Sig   ASPIRIN LOW DOSE 81 MG EC tablet TAKE ONE TABLET BY MOUTH DAILY *SWALLOW WHOLE   atorvastatin (LIPITOR) 40 MG tablet Take 40 mg by mouth daily.   buPROPion (WELLBUTRIN XL) 300 MG 24 hr tablet TAKE 1 TABLET BY MOUTH DAILY   Diclofenac Sodium (PENNSAID) 2 % SOLN Place 1 application onto the skin 2 (two) times daily.   ESTRACE VAGINAL 0.1 MG/GM vaginal cream Place 1 application vaginally at bedtime.   estradiol (ESTRACE) 0.5 MG tablet Take 0.5 mg by mouth daily.   ezetimibe (ZETIA) 10 MG tablet Take 1 tablet (10 mg total) by mouth daily.   FLUoxetine (PROZAC) 40 MG capsule TAKE ONE CAPSULE BY MOUTH DAILY   lidocaine (LIDODERM) 5 % Place 1 patch onto the skin daily. Remove & Discard patch within 12 hours or as directed by MD   medroxyPROGESTERone (PROVERA) 2.5 MG tablet Take 1 tablet by mouth daily.   Multiple Vitamins-Minerals (MULTIVITAMIN WITH MINERALS) tablet 1 tablet.   nitroGLYCERIN (NITRODUR - DOSED IN MG/24 HR) 0.4 mg/hr patch Place 0.4 mg onto the skin daily. Cut and apply 1/4 of the patch to the most painful area.   pantoprazole (PROTONIX) 40 MG tablet Take 1 tablet (40 mg total) by mouth daily.   ranolazine (RANEXA) 500  MG 12 hr tablet Take 1 tablet (500 mg total) by mouth 2 (two) times daily.   zolpidem (AMBIEN) 10 MG tablet TAKE ONE HALF TO ONE TABLET BY MOUTH EVERY NIGHT AT BEDTIME AS NEEDED FOR SLEEP   [DISCONTINUED] isosorbide mononitrate (IMDUR) 30 MG 24 hr tablet Take 1 tablet (30 mg total) by mouth daily.     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    Employer: [employer]  Tobacco  Use   Smoking status: Former    Types: Cigarettes    Quit date: 07/04/1985    Years since quitting: 35.9   Smokeless tobacco: Never  Vaping Use   Vaping Use: Never used  Substance and Sexual Activity   Alcohol use: Yes    Alcohol/week: 0.0 standard drinks    Comment: 4-5 times a week   Drug use: No   Sexual activity: Not on file  Other Topics Concern   Not on file  Social History Narrative   Regular exercise: 30 minutes 5 days a week   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      Social Determinants of Health   Financial Resource Strain: Not on file  Food Insecurity: Not on file  Transportation Needs: Not on file  Physical Activity: Not on file  Stress: Not on file  Social Connections: Not on file     Family History: The patient's family history includes Alcohol abuse in her brother; Anxiety disorder in her son; Breast cancer (age of onset: 5) in her mother; Depression in her son; Diabetes in her father and mother; Healthy in her daughter and son; Heart disease in her father and mother; Hepatitis in her brother; Raynaud syndrome in her sister; Stroke in her father.  ROS:   Review of Systems  Constitution: Negative for decreased appetite, fever and weight gain.  HENT: Negative for congestion, ear discharge, hoarse voice and sore throat.   Eyes: Negative for discharge, redness, vision loss in right eye and visual halos.  Cardiovascular: Report dyspnea on exertion, and intermittent chest discomfort.  Negative for eg swelling, orthopnea and palpitations.  Respiratory: Negative for cough, hemoptysis, shortness of breath and snoring.   Endocrine: Negative for heat intolerance and polyphagia.  Hematologic/Lymphatic: Negative for bleeding problem. Does not bruise/bleed easily.  Skin: Negative for flushing, nail changes, rash and  suspicious lesions.  Musculoskeletal: Negative for arthritis, joint pain, muscle cramps, myalgias, neck pain and stiffness.  Gastrointestinal: Negative for abdominal pain, bowel incontinence, diarrhea and excessive appetite.  Genitourinary: Negative for decreased libido, genital sores and incomplete emptying.  Neurological: Negative for brief paralysis, focal weakness, headaches and loss of balance.  Psychiatric/Behavioral: Negative for altered mental status, depression and suicidal ideas.  Allergic/Immunologic: Negative for HIV exposure and persistent infections.    EKGs/Labs/Other Studies Reviewed:    The following studies were reviewed today:   EKG:  The ekg ordered today demonstrates  Recent Labs: 05/31/2021: ALT 19; BUN 14; Creatinine, Ser 0.75; Hemoglobin 11.4; Platelets 248.0; Potassium 3.9; Sodium 140  Recent Lipid Panel  Component Value Date/Time   CHOL 209 (H) 05/31/2021 0931   CHOL 264 (H) 01/29/2020 0809   TRIG 149.0 05/31/2021 0931   HDL 56.20 05/31/2021 0931   HDL 60 01/29/2020 0809   CHOLHDL 4 05/31/2021 0931   VLDL 29.8 05/31/2021 0931   LDLCALC 123 (H) 05/31/2021 0931   LDLCALC 139 (H) 04/06/2020 0838   LDLDIRECT 103.0 12/25/2020 0831    Physical Exam:    VS:  BP 106/62 (BP Location: Left Arm, Patient Position: Sitting, Cuff Size: Normal)   Pulse 78   Ht 5\' 3"  (1.6 m)   Wt 168 lb (76.2 kg)   LMP 07/04/2004   BMI 29.76 kg/m     Wt Readings from Last 3 Encounters:  06/03/21 168 lb (76.2 kg)  05/31/21 169 lb (76.7 kg)  03/11/21 167 lb (75.8 kg)     GEN: Well nourished, well developed in no acute distress HEENT: Normal NECK: No JVD; No carotid bruits LYMPHATICS: No lymphadenopathy CARDIAC: S1S2 noted,RRR, no murmurs, rubs, gallops RESPIRATORY:  Clear to auscultation without rales, wheezing or rhonchi  ABDOMEN: Soft, non-tender, non-distended, +bowel sounds, no guarding. EXTREMITIES: No edema, No cyanosis, no clubbing MUSCULOSKELETAL:  No  deformity  SKIN: Warm and dry NEUROLOGIC:  Alert and oriented x 3, non-focal PSYCHIATRIC:  Normal affect, good insight  ASSESSMENT:    1. Coronary artery disease of native artery of native heart with stable angina pectoris ([place])   2. SOB (shortness of breath)   3. Fatigue, unspecified type   4. Daytime somnolence   5. Snoring    PLAN:    Despite some improvement with chest discomfort she has had significant headache with the Imdur, therefore I am going to stop the Imdur.  I will start patient on Ranexa 500 mg twice daily.  I am going to continue her aspirin 81 mg daily.  Her recent lipid profile shows that her LDL is 123 we will start the patient on Zetia 10 mg daily.  We will repeat lipid profile with a LP(a).  If that continues to be elevated next that we will be considering to add bempedoic acid or consider PCSK9 inhibitors.  I do suspect that sleep apnea may be playing a role here with her symptoms of fatigue, snoring as well as daytime somnolence and a stop bang score of 4 which makes her high risk for obstructive sleep apnea.  We will send patient for sleep study.  With her shortness of breath as well for completeness we will like to get an echocardiogram to rule out any structural abnormalities, diastolic dysfunction, or any right-sided heart conditions.  Hyperlipidemia-as described above.   The patient is in agreement with the above plan. The patient left the office in stable condition.  The patient will follow up in 6 months or sooner if needed.   Medication Adjustments/Labs and Tests Ordered: Current medicines are reviewed at length with the patient today.  Concerns regarding medicines are outlined above.  Orders Placed This Encounter  Procedures   Lipid panel   Lipoprotein A (LPA)   ECHOCARDIOGRAM COMPLETE   Split night study   Meds ordered this encounter  Medications   ezetimibe (ZETIA) 10 MG tablet    Sig: Take 1 tablet (10 mg total) by mouth daily.    Dispense:   90 tablet    Refill:  3   ranolazine (RANEXA) 500 MG 12 hr tablet    Sig: Take 1 tablet (500 mg total) by mouth 2 (two) times daily.  Dispense:  180 tablet    Refill:  3    Patient Instructions  Medication Instructions:  Your physician has recommended you make the following change in your medication:  STOP: Imdur  START: Ranexa 500 mg take twice daily  START: Zetia 10 mg once daily *If you need a refill on your cardiac medications before your next appointment, please call your pharmacy*   Lab Work: Your physician recommends that you return for lab work in: In 8 weeks: Lipids, Lp(a) If you have labs (blood work) drawn today and your tests are completely normal, you will receive your results only by: [place] (if you have MyChart) OR A paper copy in the mail If you have any lab test that is abnormal or we need to change your treatment, we will call you to review the results.   Testing/Procedures: Your physician has requested that you have an echocardiogram. Echocardiography is a painless test that uses sound waves to create images of your heart. It provides your doctor with information about the size and shape of your heart and how well your heart's chambers and valves are working. This procedure takes approximately one hour. There are no restrictions for this procedure.  Your physician has recommended that you have a sleep study. This test records several body functions during sleep, including: brain activity, eye movement, oxygen and carbon dioxide blood levels, heart rate and rhythm, breathing rate and rhythm, the flow of air through your mouth and nose, snoring, body muscle movements, and chest and belly movement.    Follow-Up: At [facility], you and your health needs are our priority.  As part of our continuing mission to provide you with exceptional heart care, we have created designated Provider Care Teams.  These Care Teams include your primary Cardiologist  (physician) and Advanced Practice Providers (APPs -  Physician Assistants and Nurse Practitioners) who all work together to provide you with the care you need, when you need it.  We recommend signing up for the patient portal called "MyChart".  Sign up information is provided on this After Visit Summary.  MyChart is used to connect with patients for Virtual Visits (Telemedicine).  Patients are able to view lab/test results, encounter notes, upcoming appointments, etc.  Non-urgent messages can be sent to your provider as well.   To learn more about what you can do with MyChart, go to [email or web address].    Your next appointment:   6 month(s)  The format for your next appointment:   In Person  Provider:   Berniece Salines, DO     Other Instructions Echocardiogram  An echocardiogram is a test that uses sound waves (ultrasound) to produce images of the heart. Images from an echocardiogram can provide important information about: Heart size and shape. The size and thickness and movement of your heart's walls. Heart muscle function and strength. Heart valve function or if you have stenosis. Stenosis is when the heart valves are too narrow. If blood is flowing backward through the heart valves (regurgitation). A tumor or infectious growth around the heart valves. Areas of heart muscle that are not working well because of poor blood flow or injury from a heart attack. Aneurysm detection. An aneurysm is a weak or damaged part of an artery wall. The wall bulges out from the normal force of blood pumping through the body. Tell a health care provider about: Any allergies you have. All medicines you are taking, including vitamins, herbs, eye drops, creams, and over-the-counter medicines. Any blood  disorders you have. Any surgeries you have had. Any medical conditions you have. Whether you are pregnant or may be pregnant. What are the risks? Generally, this is a safe test. However, problems may  occur, including an allergic reaction to dye (contrast) that may be used during the test. What happens before the test? No specific preparation is needed. You may eat and drink normally. What happens during the test?  You will take off your clothes from the waist up and put on a hospital gown. Electrodes or electrocardiogram (ECG)patches may be placed on your chest. The electrodes or patches are then connected to a device that monitors your heart rate and rhythm. You will lie down on a table for an ultrasound exam. A gel will be applied to your chest to help sound waves pass through your skin. A handheld device, called a transducer, will be pressed against your chest and moved over your heart. The transducer produces sound waves that travel to your heart and bounce back (or "echo" back) to the transducer. These sound waves will be captured in real-time and changed into images of your heart that can be viewed on a video monitor. The images will be recorded on a computer and reviewed by your health care provider. You may be asked to change positions or hold your breath for a short time. This makes it easier to get different views or better views of your heart. In some cases, you may receive contrast through an IV in one of your veins. This can improve the quality of the pictures from your heart. The procedure may vary among health care providers and hospitals. What can I expect after the test? You may return to your normal, everyday life, including diet, activities, and medicines, unless your health care provider tells you not to do that. Follow these instructions at home: It is up to you to get the results of your test. Ask your health care provider, or the department that is doing the test, when your results will be ready. Keep all follow-up visits. This is important. Summary An echocardiogram is a test that uses sound waves (ultrasound) to produce images of the heart. Images from an  echocardiogram can provide important information about the size and shape of your heart, heart muscle function, heart valve function, and other possible heart problems. You do not need to do anything to prepare before this test. You may eat and drink normally. After the echocardiogram is completed, you may return to your normal, everyday life, unless your health care provider tells you not to do that. This information is not intended to replace advice given to you by your health care provider. Make sure you discuss any questions you have with your health care provider. Document Revised: 03/03/2021 Document Reviewed: 02/11/2020 Elsevier Patient Education  [place].    Sleep Study, Adult  A sleep study (polysomnogram) is a series of tests done while you are sleeping. A sleep study records your brain waves, heart rate, breathing rate, oxygen level, and eye and leg movements. A sleep study helps your health care provider: See how well you sleep. Diagnose a sleep disorder. Determine how severe your sleep disorder is. Create a plan to treat your sleep disorder. Your health care provider may recommend a sleep study if you: Feel sleepy on most days. Snore loudly while sleeping. Have unusual behaviors while you sleep, such as walking. Have brief periods in which you stop breathing during sleep (sleepapnea). Fall asleep suddenly during the  day (narcolepsy). Have trouble falling asleep or staying asleep (insomnia). Feel like you need to move your legs when trying to fall asleep (restless legs syndrome). Move your legs by flexing and extending them regularly while asleep (periodic limb movement disorder). Act out your dreams while you sleep (sleep behavior disorder). Feel like you cannot move when you first wake up (sleep paralysis). What tests are part of a sleep study? Most sleep studies record the following during sleep: Brain activity. Eye movements. Heart rate and rhythm. Breathing  rate and rhythm. Blood-oxygen level. Blood pressure. Chest and belly movement as you breathe. Arm and leg movements. Snoring or other noises. Body position. Where are sleep studies done? Sleep studies are done at sleep centers. A sleep center may be inside a hospital, office, or clinic. The room where you have the study may look like a hospital room or a hotel room. The health care providers doing the study may come in and out of the room during the study. Most of the time, they will be in another room monitoring your test as you sleep. How are sleep studies done? Most sleep studies are done during a normal period of time for a full night of sleep. You will arrive at the study center in the evening and go home in the morning. Before the test Bring your pajamas and toothbrush with you to the sleep study. Do not have caffeine on the day of your sleep study. Do not drink alcohol on the day of your sleep study. Your health care provider will let you know if you should stop taking any of your regular medicines before the test. During the test   Round, sticky patches with sensors attached to recording wires (electrodes) are placed on your scalp, face, chest, and limbs. Wires from all the electrodes and sensors run from your bed to a computer. The wires can be taken off and put back on if you need to get out of bed to go to the bathroom. A sensor is placed over your nose to measure airflow. A finger clip is put on your finger or ear to measure your blood oxygen level (pulse oximetry). A belt is placed around your belly and a belt is placed around your chest to measure breathing movements. If you have signs of the sleep disorder called sleep apnea during your test, you may get a treatment mask to wear for the second half of the night. The mask provides positive airway pressure (PAP) to help you breathe better during sleep. This may greatly improve your sleep apnea. You will then have all tests done  again with the mask in place to see if your measurements and recordings change. After the test A medical doctor who specializes in sleep will evaluate the results of your sleep study and share them with you and your primary health care provider. Based on your results, your medical history, and a physical exam, you may be diagnosed with a sleep disorder, such as: Sleep apnea. Restless legs syndrome. Sleep-related behavior disorder. Sleep-related movement disorders. Sleep-related seizure disorders. Your health care team will help determine your treatment options based on your diagnosis. This may include: Improving your sleep habits (sleep hygiene). Wearing a continuous positive airway pressure (CPAP) or bi-level positive airway pressure (BPAP) mask. Wearing an oral device at night to improve breathing and reduce snoring. Taking medicines. Follow these instructions at home: Take over-the-counter and prescription medicines only as told by your health care provider. If you are instructed to  use a CPAP or BPAP mask, make sure you use it nightly as directed. Make any lifestyle changes that your health care provider recommends. If you were given a device to open your airway while you sleep, use it only as told by your health care provider. Do not use any tobacco products, such as cigarettes, chewing tobacco, and e-cigarettes. If you need help quitting, ask your health care provider. Keep all follow-up visits as told by your health care provider. This is important. Summary A sleep study (polysomnogram) is a series of tests done while you are sleeping. It shows how well you sleep. Most sleep studies are done over one full night of sleep. You will arrive at the study center in the evening and go home in the morning. If you have signs of the sleep disorder called sleep apnea during your test, you may get a treatment mask to wear for the second half of the night. A medical doctor who specializes in sleep  will evaluate the results of your sleep study and share them with your primary health care provider. This information is not intended to replace advice given to you by your health care provider. Make sure you discuss any questions you have with your health care provider. Document Revised: 01/27/2021 Document Reviewed: 07/18/2017 Elsevier Patient Education  2022 [place].   Adopting a Healthy Lifestyle.  Know what a healthy weight is for you (roughly BMI <25) and aim to maintain this   Aim for 7+ servings of fruits and vegetables daily   65-80+ fluid ounces of water or unsweet tea for healthy kidneys   Limit to max 1 drink of alcohol per day; avoid smoking/tobacco   Limit animal fats in diet for cholesterol and heart health - choose grass fed whenever available   Avoid highly processed foods, and foods high in saturated/trans fats   Aim for low stress - take time to unwind and care for your mental health   Aim for 150 min of moderate intensity exercise weekly for heart health, and weights twice weekly for bone health   Aim for 7-9 hours of sleep daily   When it comes to diets, agreement about the perfect plan isnt easy to find, even among the experts. Experts at the [employer] developed an idea known as the Healthy Eating Plate. Just imagine a plate divided into logical, healthy portions.   The emphasis is on diet quality:   Load up on vegetables and fruits - one-half of your plate: Aim for color and variety, and remember that potatoes dont count.   Go for whole grains - one-quarter of your plate: Whole wheat, barley, wheat berries, quinoa, oats, brown rice, and foods made with them. If you want pasta, go with whole wheat pasta.   Protein power - one-quarter of your plate: Fish, chicken, beans, and nuts are all healthy, versatile protein sources. Limit red meat.   The diet, however, does go beyond the plate, offering a few other suggestions.   Use  healthy plant oils, such as olive, canola, soy, corn, sunflower and peanut. Check the labels, and avoid partially hydrogenated oil, which have unhealthy trans fats.   If youre thirsty, drink water. Coffee and tea are good in moderation, but skip sugary drinks and limit milk and dairy products to one or two daily servings.   The type of carbohydrate in the diet is more important than the amount. Some sources of carbohydrates, such as vegetables, fruits, whole grains, and  beans-are healthier than others.   Finally, stay active  Signed, Berniece Salines, DO  06/03/2021 2:58 PM    [place]

## 2021-06-08 ENCOUNTER — Other Ambulatory Visit: Payer: Self-pay | Admitting: Cardiology

## 2021-06-08 ENCOUNTER — Telehealth: Payer: Self-pay | Admitting: *Deleted

## 2021-06-08 DIAGNOSIS — R400 Somnolence: Secondary | ICD-10-CM

## 2021-06-08 DIAGNOSIS — G4700 Insomnia, unspecified: Secondary | ICD-10-CM

## 2021-06-08 DIAGNOSIS — R0683 Snoring: Secondary | ICD-10-CM

## 2021-06-08 NOTE — Telephone Encounter (Signed)
Patient notified that [facility] denied in lab sleep studies due to no co morbidities for in lab study. Approved HST. Appointment scheduled and patient notified.

## 2021-06-18 ENCOUNTER — Ambulatory Visit: Payer: BC Managed Care – PPO | Admitting: Cardiology

## 2021-06-29 ENCOUNTER — Ambulatory Visit: Payer: BC Managed Care – PPO | Admitting: Family

## 2021-06-29 ENCOUNTER — Encounter: Payer: Self-pay | Admitting: Family

## 2021-07-06 ENCOUNTER — Ambulatory Visit: Payer: BC Managed Care – PPO | Admitting: Family

## 2021-07-07 ENCOUNTER — Encounter (HOSPITAL_BASED_OUTPATIENT_CLINIC_OR_DEPARTMENT_OTHER): Payer: BC Managed Care – PPO | Admitting: Cardiovascular Disease

## 2021-07-09 ENCOUNTER — Other Ambulatory Visit: Payer: Self-pay

## 2021-07-09 ENCOUNTER — Ambulatory Visit (HOSPITAL_BASED_OUTPATIENT_CLINIC_OR_DEPARTMENT_OTHER)
Admission: RE | Admit: 2021-07-09 | Discharge: 2021-07-09 | Disposition: A | Discharge status: Home / Self Care | Payer: BC Managed Care – PPO | Source: Ambulatory Visit | Attending: Cardiology | Admitting: Cardiology

## 2021-07-09 DIAGNOSIS — R0602 Shortness of breath: Secondary | ICD-10-CM

## 2021-07-09 LAB — ECHOCARDIOGRAM COMPLETE
AR max vel: 2.52 cm2
AV Area VTI: 2.51 cm2
AV Area mean vel: 2.27 cm2
AV Mean grad: 2 mmHg
AV Peak grad: 4.1 mmHg
AV Vena cont: 0.4 cm
Ao pk vel: 1.01 m/s
Area-P 1/2: 3.03 cm2
Calc EF: 54.9 %
S' Lateral: 3.4 cm
Single Plane A2C EF: 54.3 %
Single Plane A4C EF: 57.2 %

## 2021-07-12 ENCOUNTER — Other Ambulatory Visit: Payer: Self-pay | Admitting: Family

## 2021-07-12 NOTE — Telephone Encounter (Signed)
Patient comment: Currently, these patches are the only thing relieving by Achilles tendonitis pain.

## 2021-07-13 ENCOUNTER — Telehealth: Payer: Self-pay

## 2021-07-13 ENCOUNTER — Ambulatory Visit (HOSPITAL_BASED_OUTPATIENT_CLINIC_OR_DEPARTMENT_OTHER): Payer: BC Managed Care – PPO | Admitting: Cardiovascular Disease

## 2021-07-13 MED ORDER — LIDOCAINE 5 % EX PTCH: 1.0000 | MEDICATED_PATCH | CUTANEOUS | 0 refills | Status: DC

## 2021-07-13 NOTE — Telephone Encounter (Signed)
noted 

## 2021-07-13 NOTE — Telephone Encounter (Signed)
PA initiated via Covermymeds; KEY:[id number]. Awaiting determination.

## 2021-07-14 NOTE — Telephone Encounter (Signed)
PA denied. Awaiting denial information.  °

## 2021-07-16 ENCOUNTER — Ambulatory Visit: Payer: BC Managed Care – PPO | Admitting: Family

## 2021-08-03 ENCOUNTER — Other Ambulatory Visit: Payer: Self-pay

## 2021-08-03 ENCOUNTER — Ambulatory Visit (HOSPITAL_BASED_OUTPATIENT_CLINIC_OR_DEPARTMENT_OTHER): Payer: BC Managed Care – PPO | Attending: Cardiology | Admitting: Cardiovascular Disease

## 2021-08-03 DIAGNOSIS — R400 Somnolence: Secondary | ICD-10-CM | POA: Insufficient documentation

## 2021-08-03 DIAGNOSIS — G4733 Obstructive sleep apnea (adult) (pediatric): Secondary | ICD-10-CM | POA: Diagnosis not present

## 2021-08-03 DIAGNOSIS — G4700 Insomnia, unspecified: Secondary | ICD-10-CM | POA: Insufficient documentation

## 2021-08-03 DIAGNOSIS — R0683 Snoring: Secondary | ICD-10-CM | POA: Diagnosis not present

## 2021-08-12 ENCOUNTER — Encounter: Payer: Self-pay | Admitting: Cardiology

## 2021-08-15 ENCOUNTER — Other Ambulatory Visit: Payer: Self-pay | Admitting: Family

## 2021-08-25 ENCOUNTER — Encounter (HOSPITAL_BASED_OUTPATIENT_CLINIC_OR_DEPARTMENT_OTHER): Payer: Self-pay | Admitting: Cardiovascular Disease

## 2021-08-25 NOTE — Procedures (Signed)
° ° ° °  Patient Name: Haley Fuentes, [place] Date: 08/03/2021 Gender: Female D.O.B: 1961/03/02 Age (years): 21 Referring Provider: Godfrey Pick Tobb DO Height (inches): 63 Interpreting Physician: Shelva Majestic MD, ABSM Weight (lbs): 168 RPSGT: Jacolyn Reedy BMI: 30 MRN: [id number] Neck Size: 14.00  CLINICAL INFORMATION Sleep Study Type: HST  Indication for sleep study: snoring, fatigue, daytime somnolence  Epworth Sleepiness Score: 5  SLEEP STUDY TECHNIQUE A multi-channel overnight portable sleep study was performed. The channels recorded were: nasal airflow, thoracic respiratory movement, and oxygen saturation with a pulse oximetry. Snoring was also monitored.  MEDICATIONS Patient self administered medications include: N/A.  SLEEP ARCHITECTURE Patient was studied for 371.5 minutes. The sleep efficiency was 100.0 % and the patient was supine for 93.1%. The arousal index was 0.0 per hour.  RESPIRATORY PARAMETERS The overall AHI was 32.1 per hour, with a central apnea index of 0 per hour.  The oxygen nadir was 80% during sleep. Time spoent < 89% was 4.2 minutes.  CARDIAC DATA Mean heart rate during sleep was 67.8 bpm.  IMPRESSIONS - Severe obstructive sleep apnea occurred during this study (AHI 32.1/h). - Moderate oxygen desaturation to a nadir of 80%. - Patient snored 7.9% during the sleep.  DIAGNOSIS - Obstructive Sleep Apnea (G47.33)  RECOMMENDATIONS - In this patient with severe sleep apnea recommend therapeutic CPAP to alleviate her sleep disordered breathing. If unable to obtain an in-lab titration, initiate Auto-PAP with EPR of 3 at 7 - 20 cm of water. - Effort should be made to optimize nasal and oropharyngeal patency. - Avoid alcohol, sedatives and other CNS depressants that may worsen sleep apnea and disrupt normal sleep architecture. - Sleep hygiene should be reviewed to assess factors that may improve sleep quality. - Weight management (BMI 30) and regular  exercise should be initiated or continued. - Recommend a download and sleep clinic evaluation after one month of therapy.    [Electronically signed] 08/25/2021 10:03 AM  Shelva Majestic MD, [facility], [place], American Board of Sleep Medicine   NPI: [id number]  [place] PH: [phone]   FX: [phone] [place]

## 2021-08-27 ENCOUNTER — Telehealth: Payer: Self-pay | Admitting: Cardiology

## 2021-08-27 NOTE — Telephone Encounter (Signed)
Patient would like Dr. Harriet Masson to call her today to go over the results of her sleep study test. The patient is concerned about how the test came out

## 2021-08-30 ENCOUNTER — Telehealth: Payer: Self-pay | Admitting: *Deleted

## 2021-08-30 NOTE — Telephone Encounter (Signed)
°  Patient is calling back regarding her results of her sleep study. She would like someone to call her to discuss the next steps since the study found she has sleep apnea. Please call

## 2021-08-30 NOTE — Telephone Encounter (Signed)
Patient notified of HST results and recommendations. She agrees to proceed with CPAP titration. Request submitted to [place]. It was denied. APAP approved. APAP order sent to [facility].

## 2021-08-30 NOTE — Telephone Encounter (Signed)
-----   Message from Troy Sine, MD sent at 08/25/2021 10:07 AM EST ----- Mariann Laster, please notify pt and set up for CPAP titration or Auto-PAP

## 2021-08-31 NOTE — Telephone Encounter (Signed)
This message was taken care of yesterday.

## 2021-09-07 DIAGNOSIS — G4733 Obstructive sleep apnea (adult) (pediatric): Secondary | ICD-10-CM | POA: Diagnosis not present

## 2021-09-12 ENCOUNTER — Other Ambulatory Visit: Payer: Self-pay | Admitting: Family

## 2021-09-13 ENCOUNTER — Other Ambulatory Visit: Payer: Self-pay | Admitting: Family

## 2021-09-23 ENCOUNTER — Encounter: Payer: Self-pay | Admitting: Cardiology

## 2021-10-08 DIAGNOSIS — G4733 Obstructive sleep apnea (adult) (pediatric): Secondary | ICD-10-CM | POA: Diagnosis not present

## 2021-10-11 ENCOUNTER — Telehealth: Payer: BC Managed Care – PPO | Admitting: Family Medicine

## 2021-10-11 ENCOUNTER — Encounter: Payer: Self-pay | Admitting: Family

## 2021-10-11 DIAGNOSIS — U071 COVID-19: Secondary | ICD-10-CM

## 2021-10-11 NOTE — Progress Notes (Signed)
Haley Fuentes  ? ?Needs in person eval related to use of CPAP and COVID ? ?Message sent via mychart  ?

## 2021-10-31 ENCOUNTER — Encounter: Payer: Self-pay | Admitting: Family

## 2021-11-07 DIAGNOSIS — G4733 Obstructive sleep apnea (adult) (pediatric): Secondary | ICD-10-CM | POA: Diagnosis not present

## 2021-11-14 ENCOUNTER — Telehealth: Payer: Self-pay | Admitting: Family

## 2021-11-15 NOTE — Telephone Encounter (Signed)
Patient is due for follow up appointment. Called but no answer, lvm for her to call back and schedule. ?

## 2021-11-16 NOTE — Telephone Encounter (Signed)
Spoke to patient and she will schedule the follow up in her mychart ?

## 2021-11-16 NOTE — Telephone Encounter (Signed)
Pt called to ask about Haley Fuentes's call. Advised pt they needed a follow up appt. Pt wanted to know should she schedule that appt before or after her sleep clinic and cardio appts. ?

## 2021-11-17 NOTE — Telephone Encounter (Signed)
Patient scheduled for 5/19.

## 2021-11-19 ENCOUNTER — Encounter: Payer: Self-pay | Admitting: Family

## 2021-11-19 ENCOUNTER — Telehealth: Payer: Self-pay | Admitting: Family

## 2021-11-19 ENCOUNTER — Ambulatory Visit (HOSPITAL_BASED_OUTPATIENT_CLINIC_OR_DEPARTMENT_OTHER)
Admission: RE | Admit: 2021-11-19 | Discharge: 2021-11-19 | Disposition: A | Discharge status: Home / Self Care | Payer: BC Managed Care – PPO | Source: Ambulatory Visit | Attending: Family | Admitting: Family

## 2021-11-19 ENCOUNTER — Ambulatory Visit: Payer: BC Managed Care – PPO | Admitting: Family

## 2021-11-19 VITALS — BP 108/64 | HR 65 | Temp 97.9°F | Ht 62.0 in | Wt 170.0 lb

## 2021-11-19 DIAGNOSIS — G4700 Insomnia, unspecified: Secondary | ICD-10-CM | POA: Diagnosis not present

## 2021-11-19 DIAGNOSIS — E785 Hyperlipidemia, unspecified: Secondary | ICD-10-CM

## 2021-11-19 DIAGNOSIS — R059 Cough, unspecified: Secondary | ICD-10-CM | POA: Diagnosis not present

## 2021-11-19 DIAGNOSIS — F32A Depression, unspecified: Secondary | ICD-10-CM

## 2021-11-19 DIAGNOSIS — Z79899 Other long term (current) drug therapy: Secondary | ICD-10-CM | POA: Diagnosis not present

## 2021-11-19 DIAGNOSIS — E669 Obesity, unspecified: Secondary | ICD-10-CM

## 2021-11-19 LAB — LIPID PANEL
Cholesterol: 307 mg/dL — ABNORMAL HIGH (ref 0–200)
HDL: 48.8 mg/dL (ref >39.00)
NonHDL: 257.71
Total CHOL/HDL Ratio: 6
Triglycerides: 219 mg/dL — ABNORMAL HIGH (ref 0.0–149.0)
VLDL: 43.8 mg/dL — ABNORMAL HIGH (ref 0.0–40.0)

## 2021-11-19 LAB — LDL CHOLESTEROL, DIRECT: Direct LDL: 203 mg/dL

## 2021-11-19 MED ORDER — OMEPRAZOLE 40 MG PO CPDR: 40.0000 mg | DELAYED_RELEASE_CAPSULE | Freq: Every day | ORAL | 3 refills | Status: DC

## 2021-11-19 NOTE — Assessment & Plan Note (Signed)
Stable with prn use of ambien. Controlled substance contract is updated and UDS will be obtained.

## 2021-11-19 NOTE — Assessment & Plan Note (Addendum)
I suspect a GERD component as well as some residual post-covid cough. Will rx omeprazole. Will also refer to pulmonology for PFTS/further evaluation.

## 2021-11-19 NOTE — Assessment & Plan Note (Signed)
Fair control. Continue wellbutrin.

## 2021-11-19 NOTE — Progress Notes (Signed)
Subjective:     Patient ID: Haley Fuentes, female    DOB: Aug 26, 1960, 61 y.o.   MRN: [id number]  Chief Complaint  Patient presents with   Medical Management of Chronic Issues    6 m f/u    Cough    Dry night cough. Brain fog since covid.    HPI Patient is in today for follow up.   Depression- She stopped her wellbutrin and found herself more tearful. She restarted and now feels fine.   Brain Fog/Cough- have been present since covid. Reports that she has trouble remembering certain words.  Sometimes forgets things.  Increased stress at work.    Cough is mostly at night. Last night it was "really bad." Cough is dry and hacking. She had it before covid but it got worse during covid.  She is wearing the cpap.     Also concerned about weight gain. Especially "belly fat."   Wt Readings from Last 3 Encounters:  11/19/21 170 lb (77.1 kg)  08/03/21 165 lb (74.8 kg)  06/03/21 168 lb (76.2 kg)     Rare use of ambien.        Health Maintenance Due  Topic Date Due   URINE MICROALBUMIN  Never done   HIV Screening  Never done   PAP SMEAR-Modifier  03/22/2020   COVID-19 Vaccine (4 - Booster for [employer] series) 08/25/2020    Past Medical History:  Diagnosis Date   Anemia    hx of iron deficient anemia   Arthritis    Blood transfusion without reported diagnosis    Depression    Gastric polyp    GERD (gastroesophageal reflux disease)    Hemorrhoids    HLD (hyperlipidemia)    Joint pain    left shoulder   Kidney lesion    right   Osteopenia 05/04/2017   Postmenopausal     Past Surgical History:  Procedure Laterality Date   ENDOMETRIAL ABLATION  2007   Novasure   TUBAL LIGATION  1983    Family History  Problem Relation Age of Onset   Diabetes Mother    Heart disease Mother    Breast cancer Mother 64   Stroke Father    Diabetes Father    Heart disease Father        CABG, AVR, A-Fib   Hepatitis Brother    Alcohol abuse Brother    Raynaud syndrome Sister     Healthy Daughter    Depression Son    Anxiety disorder Son    Healthy Son     Social History   Socioeconomic History   Marital status: Married    Spouse name: Not on file   Number of children: 2   Years of education: Not on file   Highest education level: Not on file  Occupational History   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    Employer: [employer]  Tobacco Use   Smoking status: Former    Types: Cigarettes    Quit date: 07/04/1985    Years since quitting: 36.4   Smokeless tobacco: Never  Vaping Use   Vaping Use: Never used  Substance and Sexual Activity   Alcohol use: Yes    Alcohol/week: 0.0 standard drinks    Comment: 4-5 times a week   Drug use: No   Sexual activity: Not on file  Other Topics Concern   Not on file  Social History Narrative   Regular exercise: 30 minutes 5 days a week   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      Social Determinants of Health   Financial Resource Strain: Not on file  Food Insecurity: Not on file  Transportation Needs: Not on file  Physical Activity: Not on file  Stress: Not on file  Social Connections: Not on file  Intimate Partner Violence: Not on file    Outpatient Medications Prior to Visit  Medication Sig Dispense Refill   ASPIRIN LOW DOSE 81 MG EC tablet TAKE ONE TABLET BY MOUTH DAILY *SWALLOW WHOLE 90 tablet 3   buPROPion (WELLBUTRIN XL) 300 MG 24 hr tablet TAKE 1 TABLET BY MOUTH DAILY **NEEDS APPOINTMENT!!** 30 tablet 0   Diclofenac Sodium (PENNSAID) 2 % SOLN Place 1 application onto the skin 2 (two) times daily. 112 g 3   ESTRACE VAGINAL 0.1 MG/GM vaginal cream Place 1 application vaginally at bedtime.  1   estradiol (ESTRACE) 0.5 MG tablet Take 0.5 mg by mouth daily.     Multiple Vitamins-Minerals (MULTIVITAMIN WITH MINERALS) tablet 1 tablet.     nitroGLYCERIN (NITRODUR -  DOSED IN MG/24 HR) 0.4 mg/hr patch Place 0.4 mg onto the skin daily. Cut and apply 1/4 of the patch to the most painful area.     zolpidem (AMBIEN) 10 MG tablet TAKE ONE HALF TO ONE TABLET BY MOUTH EVERY NIGHT AT BEDTIME AS NEEDED FOR SLEEP 15 tablet 0   medroxyPROGESTERone (PROVERA) 2.5 MG tablet Take 1 tablet by mouth daily.     atorvastatin (LIPITOR) 40 MG tablet Take 40 mg by mouth daily.     ezetimibe (ZETIA) 10 MG tablet Take 1 tablet (10 mg total) by mouth daily. 90 tablet 3   FLUoxetine (PROZAC) 40 MG capsule TAKE ONE CAPSULE BY MOUTH DAILY 90 capsule 1   lidocaine (LIDODERM) 5 % Place 1 patch onto the skin daily. Remove & Discard patch within 12 hours or as directed by MD 30 patch 0   pantoprazole (PROTONIX) 40 MG tablet TAKE 1 TABLET BY MOUTHDAILY 90 tablet 1   ranolazine (RANEXA) 500 MG 12 hr tablet Take 1 tablet (500 mg total) by mouth 2 (two) times daily. 180 tablet 3   No facility-administered medications prior to visit.    Allergies  Allergen Reactions   Hydrocodone-Acetaminophen     "made her skin crawl"   Hydrocodone-Acetaminophen Itching   Omni-Pac     vomitting   Voltaren [Diclofenac]     diarrhea    ROS    See HPI Objective:    Physical Exam Constitutional:      General: She is not in acute distress.    Appearance: Normal appearance. She is well-developed.  HENT:     Head: Normocephalic and atraumatic.     Right Ear: Tympanic membrane, ear canal and external ear normal.     Left Ear: Tympanic membrane, ear canal and external ear normal.  Eyes:     General: No scleral icterus. Neck:     Thyroid: No thyromegaly.  Cardiovascular:     Rate and Rhythm: Normal rate and regular rhythm.     Heart sounds: Normal heart sounds. No murmur heard. Pulmonary:     Effort: Pulmonary effort is normal. No respiratory distress.     Breath sounds: Normal breath sounds. No wheezing.  Musculoskeletal:     Cervical back: Neck supple.  Skin:    General: Skin  is warm and  dry.  Neurological:     Mental Status: She is alert and oriented to person, place, and time.  Psychiatric:        Mood and Affect: Mood normal.        Behavior: Behavior normal.        Thought Content: Thought content normal.        Judgment: Judgment normal.    BP 108/64   Pulse 65   Temp 97.9 F (36.6 C) (Oral)   Ht '5\' 2"'$  (1.575 m)   Wt 170 lb (77.1 kg)   LMP 07/04/2004   SpO2 94%   BMI 31.09 kg/m  Wt Readings from Last 3 Encounters:  11/19/21 170 lb (77.1 kg)  08/03/21 165 lb (74.8 kg)  06/03/21 168 lb (76.2 kg)       Assessment & Plan:   Problem List Items Addressed This Visit       Unprioritized   INSOMNIA, CHRONIC    Stable with prn use of ambien. Controlled substance contract is updated and UDS will be obtained.        Hyperlipidemia    Pt stopped cholesterol medications .Will see how her lipids look.        Relevant Orders   Lipid panel   Depression    Fair control. Continue wellbutrin.        Cough - Primary    I suspect a GERD component as well as some residual post-covid cough. Will rx omeprazole. Will also refer to pulmonology for PFTS/further evaluation.        Relevant Medications   omeprazole (PRILOSEC) 40 MG capsule   Other Relevant Orders   DG Chest 2 View   Ambulatory referral to Pulmonology   Other Visit Diagnoses     Obesity (BMI 30.0-34.9)       Relevant Orders   Amb Ref to Medical Weight Management   Insomnia, unspecified type       Long-term use of high-risk medication       Relevant Orders   Drug Tox Monitor 1 w/Conf, Oral Fld       I have discontinued [facility] medroxyPROGESTERone, atorvastatin, FLUoxetine, ezetimibe, ranolazine, lidocaine, and pantoprazole. I am also having her start on omeprazole. Additionally, I am having her maintain her estradiol, multivitamin with minerals, ESTRACE VAGINAL, nitroGLYCERIN, Pennsaid, zolpidem, Aspirin Low Dose, and buPROPion.  Meds ordered this encounter  Medications    omeprazole (PRILOSEC) 40 MG capsule    Sig: Take 1 capsule (40 mg total) by mouth daily.    Dispense:  30 capsule    Refill:  3    Order Specific Question:   Supervising Provider    Answer:   Penni Homans A [[id number]]

## 2021-11-19 NOTE — Assessment & Plan Note (Signed)
Pt stopped cholesterol medications .Will see how her lipids look.

## 2021-11-19 NOTE — Telephone Encounter (Signed)
See mychart.  

## 2021-11-22 ENCOUNTER — Encounter: Payer: Self-pay | Admitting: Family

## 2021-11-23 MED ORDER — MEDROXYPROGESTERONE ACETATE 2.5 MG PO TABS: 2.5000 mg | ORAL_TABLET | Freq: Every day | ORAL | Status: AC

## 2021-11-23 NOTE — Addendum Note (Signed)
Addended by: [name] on: 11/23/2021 07:52 AM   Modules accepted: Orders

## 2021-11-24 ENCOUNTER — Ambulatory Visit: Payer: BC Managed Care – PPO | Admitting: Cardiovascular Disease

## 2021-11-24 ENCOUNTER — Encounter: Payer: Self-pay | Admitting: Cardiovascular Disease

## 2021-11-24 DIAGNOSIS — G4733 Obstructive sleep apnea (adult) (pediatric): Secondary | ICD-10-CM | POA: Diagnosis not present

## 2021-11-24 DIAGNOSIS — E782 Mixed hyperlipidemia: Secondary | ICD-10-CM

## 2021-11-24 DIAGNOSIS — R0602 Shortness of breath: Secondary | ICD-10-CM

## 2021-11-24 DIAGNOSIS — I25118 Atherosclerotic heart disease of native coronary artery with other forms of angina pectoris: Secondary | ICD-10-CM | POA: Diagnosis not present

## 2021-11-24 DIAGNOSIS — E7849 Other hyperlipidemia: Secondary | ICD-10-CM

## 2021-11-24 DIAGNOSIS — R400 Somnolence: Secondary | ICD-10-CM | POA: Diagnosis not present

## 2021-11-24 NOTE — Patient Instructions (Signed)
Medication Instructions:  The current medical regimen is effective;  continue present plan and medications as directed. Please refer to the Current Medication list given to you today.  *If you need a refill on your cardiac medications before your next appointment, please call your pharmacy*  Lab Work:   Testing/Procedures:  NONE    NONE If you have labs (blood work) drawn today and your tests are completely normal, you will receive your results only by: [place] (if you have MyChart) OR  A paper copy in the mail If you have any lab test that is abnormal or we need to change your treatment, we will call you to review the results.  Follow-Up: Your next appointment:  AS NEEDED  In Person with DR Claiborne Billings     At [facility], you and your health needs are our priority.  As part of our continuing mission to provide you with exceptional heart care, we have created designated Provider Care Teams.  These Care Teams include your primary Cardiologist (physician) and Advanced Practice Providers (APPs -  Physician Assistants and Nurse Practitioners) who all work together to provide you with the care you need, when you need it.    Important Information About Sugar

## 2021-11-24 NOTE — Progress Notes (Signed)
Cardiology Office Note    Date:  12/01/2021   ID:  Haley Fuentes, DOB 01-25-1961, MRN [id number]  PCP:  Haley Alar, NP  Cardiologist:  Haley Majestic, MD (sleep); Dr. Harriet Fuentes  New Sleep consultation   History of Present Illness:  Haley Fuentes is a 61 y.o. female who is followed by Dr. Berniece Salines and sees Haley Alar, NP as her primary provider.  She has a history of hyperlipidemia, nonobstructive CAD noted on coronary CTA, as well as hyperlipidemia.  She had experienced some precordial chest pain felt most likely nonischemic.  Due to concerns for obstructive sleep apnea with symptoms of snoring, daytime somnolence and fatigability she was referred for an initial home sleep test which was done on August 03, 2021.  She was found to have severe obstructive sleep apnea with an 8 AHI of 32.1/h.  She had significant oxygen desaturation to a nadir of 80% and time spent less than 89% was 4.2 minutes.  She snored for 7.9% of her sleep duration.  She had undergone an echo Doppler study which had shown an EF of 55 to 60% with grade 1 diastolic dysfunction.  She ultimately initiated AutoPap therapy and received a new AirSense 11 CPAP auto unit with [facility] as her DME company on October 25, 2021.  I was able to obtain a download from April 24 through Nov 23, 2021.  She is meeting compliance standards with usage days at 26 of 30 days and usage greater than 4 hours at 73%.  Average use was only 6 hours and 18 minutes.  Her CPAP auto unit was set at a pressure range of 7 to 20 cm of water and AHI is 2.3.  Her 95th percentile pressure is 10.6 with maximum average pressure at 11.8 cm of water.  Since initiating CPAP therapy, she feels she is sleeping significantly better.  She has had some issues with her current mask.  She is unaware of any breakthrough snoring.  A new Epworth Sleepiness Scale score was calculated in the office today and this endorsed at 7 as shown below:   Epworth Sleepiness  Scale: Situation   Chance of Dozing/Sleeping (0 = never , 1 = slight chance , 2 = moderate chance , 3 = high chance )   sitting and reading 1   watching TV 3   sitting inactive in a public place 0   being a passenger in a motor vehicle for an hour or more 0   lying down in the afternoon 3   sitting and talking to someone 0   sitting quietly after lunch (no alcohol) 0   while stopped for a few minutes in traffic as the driver 0   Total Score  7    Presently, she denies any significant nocturia.  She denies any breakthrough snoring, bruxism, restless legs, no neck or hypnopompic hallucinations or cataplectic events.  She presents for her initial sleep evaluation.   Past Medical History:  Diagnosis Date   Anemia    hx of iron deficient anemia   Arthritis    Blood transfusion without reported diagnosis    Depression    Gastric polyp    GERD (gastroesophageal reflux disease)    Hemorrhoids    HLD (hyperlipidemia)    Joint pain    left shoulder   Kidney lesion    right   Osteopenia 05/04/2017   Postmenopausal     Past Surgical History:  Procedure Laterality Date   ENDOMETRIAL ABLATION  2007   Novasure   TUBAL LIGATION  1983    Current Medications: Outpatient Medications Prior to Visit  Medication Sig Dispense Refill   ASPIRIN LOW DOSE 81 MG EC tablet TAKE ONE TABLET BY MOUTH DAILY *SWALLOW WHOLE 90 tablet 3   buPROPion (WELLBUTRIN XL) 300 MG 24 hr tablet TAKE 1 TABLET BY MOUTH DAILY **NEEDS APPOINTMENT!!** 30 tablet 0   Diclofenac Sodium (PENNSAID) 2 % SOLN Place 1 application onto the skin 2 (two) times daily. 112 g 3   ESTRACE VAGINAL 0.1 MG/GM vaginal cream Place 1 application vaginally at bedtime.  1   estradiol (ESTRACE) 0.5 MG tablet Take 0.5 mg by mouth daily.     medroxyPROGESTERone (PROVERA) 2.5 MG tablet Take 1 tablet (2.5 mg total) by mouth daily.     Multiple Vitamins-Minerals (MULTIVITAMIN WITH MINERALS) tablet 1 tablet.     omeprazole (PRILOSEC) 40 MG capsule  Take 1 capsule (40 mg total) by mouth daily. 30 capsule 3   zolpidem (AMBIEN) 10 MG tablet TAKE ONE HALF TO ONE TABLET BY MOUTH EVERY NIGHT AT BEDTIME AS NEEDED FOR SLEEP 15 tablet 0   nitroGLYCERIN (NITRODUR - DOSED IN MG/24 HR) 0.4 mg/hr patch Place 0.4 mg onto the skin daily. Cut and apply 1/4 of the patch to the most painful area.     No facility-administered medications prior to visit.     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    Employer: [employer]  Tobacco Use   Smoking status: Former    Types: Cigarettes    Quit date: 07/04/1985    Years since quitting: 36.4   Smokeless tobacco: Never  Vaping Use   Vaping Use: Never used  Substance and Sexual Activity   Alcohol use: Yes    Alcohol/week: 0.0 standard drinks    Comment: 4-5 times a week   Drug use: No   Sexual activity: Not on file  Other Topics Concern   Not on file  Social History Narrative   Regular exercise: 30 minutes 5 days a week   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      Social Determinants of Health   Financial Resource Strain: Not on file  Food Insecurity: Not on file  Transportation Needs: Not on file  Physical Activity: Not on file  Stress: Not on file  Social Connections: Not on file     Family History:  The patient's family history includes Alcohol abuse in her brother; Anxiety disorder in her son; Breast cancer (age of onset: 83) in her mother; Depression in her son; Diabetes in her father and mother; Healthy in her daughter and son;  Heart disease in her father and mother; Hepatitis in her brother; Raynaud syndrome in her sister; Stroke in her father.   ROS General: Negative; No fevers, chills, or night sweats;  HEENT: Negative; No changes in vision or hearing, sinus congestion, difficulty swallowing Pulmonary: Negative; No cough, wheezing, shortness of breath, hemoptysis Cardiovascular: Positive for hyperlipidemia, with LDL cholesterol at 203, atypical chest pain, nonobstructive  CAD GI: Negative; No nausea, vomiting, diarrhea, or abdominal pain GU: Negative; No dysuria, hematuria, or difficulty voiding Musculoskeletal: Negative; no myalgias, joint pain, or weakness Hematologic/Oncology: Negative; no easy bruising, bleeding Endocrine: Negative; no heat/cold intolerance; no diabetes Neuro: Negative; no changes in balance, headaches Skin: Negative; No rashes or skin lesions Psychiatric: Negative; No behavioral problems, depression Sleep: Negative; No snoring, daytime sleepiness, hypersomnolence, bruxism, restless legs, hypnogognic hallucinations, no cataplexy Other comprehensive 14 point system review is negative.   PHYSICAL EXAM:   VS:  BP 122/78   Pulse 63   Ht '5\' 2"'  (1.575 m)   Wt 173 lb 9.6 oz (78.7 kg)   LMP 07/04/2004   SpO2 99%   BMI 31.75 kg/m     Repeat blood pressure by me was 120/76  Wt Readings from Last 3 Encounters:  11/24/21 173 lb 9.6 oz (78.7 kg)  11/19/21 170 lb (77.1 kg)  08/03/21 165 lb (74.8 kg)    General: Alert, oriented, no distress.  Skin: normal turgor, no rashes, warm and dry HEENT: Normocephalic, atraumatic. Pupils equal round and reactive to light; sclera anicteric; extraocular muscles intact;  Nose without nasal septal hypertrophy Mouth/Parynx benign; Mallinpatti scale 2 Neck: No JVD, no carotid bruits; normal carotid upstroke Lungs: clear to ausculatation and percussion; no wheezing or rales Chest wall: without tenderness to palpitation Heart: PMI not displaced, RRR, s1 s2  normal, 1/6 systolic murmur, no diastolic murmur, no rubs, gallops, thrills, or heaves Abdomen: soft, nontender; no hepatosplenomehaly, BS+; abdominal aorta nontender and not dilated by palpation. Back: no CVA tenderness Pulses 2+ Musculoskeletal: full range of motion, normal strength, no joint deformities Extremities: no clubbing cyanosis or edema, Homan's sign negative  Neurologic: grossly nonfocal; Cranial nerves grossly wnl Psychologic: Normal mood and affect   Studies/Labs Reviewed:   September 24, 2021 ECG (independently read by me):  NSR at 63; NSST changes, no ectopy  Recent Labs:    Latest Ref Rng & Units 05/31/2021    9:31 AM 10/02/2019    1:06 PM 03/13/2019    9:16 AM  BMP  Glucose 70 - 99 mg/dL 95   85   91    BUN 6 - 23 mg/dL '14   16   15    ' Creatinine 0.40 - 1.20 mg/dL 0.75   0.71   0.59    Sodium 135 - 145 mEq/L 140   139   138    Potassium 3.5 - 5.1 mEq/L 3.9   4.3   4.0    Chloride 96 - 112 mEq/L 105   103   103    CO2 19 - 32 mEq/L '27   30   28    ' Calcium 8.4 - 10.5 mg/dL 9.4   9.7   9.4          Latest Ref Rng & Units 05/31/2021    9:31 AM 10/02/2019    1:06 PM 03/13/2019    9:16 AM  Hepatic Function  Total Protein 6.0 - 8.3 g/dL 6.6   7.0   6.7    Albumin 3.5 - 5.2 g/dL 4.3   4.7   4.3    AST 0 - 37 U/L '18   18   18    ' ALT 0 - 35 U/L '19   19   24    ' Alk Phosphatase 39 - 117 U/L 69   76   66    Total Bilirubin 0.2 - 1.2 mg/dL 0.6   0.6   0.8  Bilirubin, Direct 0.0 - 0.3 mg/dL  0.1          Latest Ref Rng & Units 05/31/2021    9:31 AM 10/02/2019    1:06 PM 03/13/2019    9:16 AM  CBC  WBC 4.0 - 10.5 K/uL 6.0   7.1   6.4    Hemoglobin 12.0 - 15.0 g/dL 11.4   12.4   12.2    Hematocrit 36.0 - 46.0 % 33.8   36.6   36.3    Platelets 150.0 - 400.0 K/uL 248.0   234.0   266.0     Lab Results  Component Value Date   MCV 93.8 05/31/2021   MCV 94.9 10/02/2019   MCV 94.9 03/13/2019   Lab Results  Component Value Date   TSH 1.57 10/02/2019   Lab Results   Component Value Date   HGBA1C 5.4 04/14/2010     BNP No results found for: BNP  ProBNP No results found for: PROBNP   Lipid Panel     Component Value Date/Time   CHOL 307 (H) 11/19/2021 0830   CHOL 264 (H) 01/29/2020 0809   TRIG 219.0 (H) 11/19/2021 0830   HDL 48.80 11/19/2021 0830   HDL 60 01/29/2020 0809   CHOLHDL 6 11/19/2021 0830   VLDL 43.8 (H) 11/19/2021 0830   LDLCALC 123 (H) 05/31/2021 0931   LDLCALC 139 (H) 04/06/2020 0838   LDLDIRECT 203.0 11/19/2021 0830   LABVLDL 27 01/29/2020 0809     RADIOLOGY: DG Chest 2 View  Result Date: 11/20/2021 CLINICAL DATA:  Cough EXAM: CHEST - 2 VIEW COMPARISON:  Chest radiograph 06/05/2020 FINDINGS: The heart size and mediastinal contours are within normal limits. Both lungs are clear. The visualized skeletal structures are unremarkable. IMPRESSION: No active cardiopulmonary disease. Electronically Signed   By: Lovey Newcomer M.D.   On: 11/20/2021 01:56     Additional studies/ records that were reviewed today include:   08/03/2021 CLINICAL INFORMATION Sleep Study Type: HST   Indication for sleep study: snoring, fatigue, daytime somnolence   Epworth Sleepiness Score: 5   SLEEP STUDY TECHNIQUE A multi-channel overnight portable sleep study was performed. The channels recorded were: nasal airflow, thoracic respiratory movement, and oxygen saturation with a pulse oximetry. Snoring was also monitored.   MEDICATIONS Patient self administered medications include: N/A.   SLEEP ARCHITECTURE Patient was studied for 371.5 minutes. The sleep efficiency was 100.0 % and the patient was supine for 93.1%. The arousal index was 0.0 per hour.   RESPIRATORY PARAMETERS The overall AHI was 32.1 per hour, with a central apnea index of 0 per hour.   The oxygen nadir was 80% during sleep. Time spoent < 89% was 4.2 minutes.   CARDIAC DATA Mean heart rate during sleep was 67.8 bpm.   IMPRESSIONS - Severe obstructive sleep apnea occurred  during this study (AHI 32.1/h). - Moderate oxygen desaturation to a nadir of 80%. - Patient snored 7.9% during the sleep.   DIAGNOSIS - Obstructive Sleep Apnea (G47.33)   RECOMMENDATIONS - In this patient with severe sleep apnea recommend therapeutic CPAP to alleviate her sleep disordered breathing. If unable to obtain an in-lab titration, initiate Auto-PAP with EPR of 3 at 7 - 20 cm of water. - Effort should be made to optimize nasal and oropharyngeal patency. - Avoid alcohol, sedatives and other CNS depressants that may worsen sleep apnea and disrupt normal sleep architecture. - Sleep hygiene should be reviewed to assess factors that may improve sleep quality. - Weight  management (BMI 30) and regular exercise should be initiated or continued. - Recommend a download and sleep clinic evaluation after one month of therapy.      ASSESSMENT:    1. OSA (obstructive sleep apnea)   2. Daytime somnolence   3. SOB (shortness of breath)   4. Coronary artery disease of native artery of native heart with stable angina pectoris ([place])   5. Possible familial hyperlipidemia   6. Mixed hyperlipidemia    PLAN:  Ms. Honest Safranek is a 61 year old patient who is followed by Dr. Harriet Fuentes for cardiology care and has a history of marked hyperlipidemia with total cholesterol 307 and recent direct LDL cholesterol measured at 203.  She also had elevated triglycerides at 219.  She has been treated with statin therapy in the past but may require more aggressive intervention with marked LDL elevation and possible familial hyperlipidemia.  From a sleep perspective, she was diagnosed as having severe obstructive sleep apnea on a home sleep study and had an overall AHI of 32.1/h and oxygen nadir at 80%.  She did not have a CPAP titration study but ultimately was initiated on AutoPap therapy with [facility] associated company and CPAP initiation with a ResMed air sense 11 AutoSet unit on October 25, 2021.  Her download reveals  that she is meeting compliance standards but she had missed 3 out of 30 days and her average use was only 6 hours and 18 minutes.  I discussed with her optimal sleep duration at 7 to 9 hours.  I discussed normal sleep architecture and potential disruptive sleep architecture contributed by untreated treated sleep apnea.  I specifically discussed potential adverse cardiovascular consequences if sleep apnea is left untreated particularly with reference to the inability to have the normal 20 mm diastolic dip in blood pressure with sleep with particularly REM sleep apnea contributing to more resistant hypertension.  I discussed potential for nocturnal arrhythmias as well as increased incidence of atrial fibrillation.  I discussed its effects on insulin resistance, increased inflammation, as well as GERD.  In addition I discussed potential adverse consequences of nocturnal hypoxemia contributing to nocturnal ischemia and potential acute coronary syndrome risk particularly if there is vulnerable plaque in the setting of increased inflammation.  In addition I discussed the pathophysiology associated with increased nocturia if sleep apnea is left untreated.  Presently, she is doing well but can improve with her sleep duration.  I discussed the importance of optimal weight and increased exercise.  I will change her to a more comfortable ResMed AirFit N30i mask.  She will follow-up with Dr. Irene Pap and may require PCSK9 inhibition for more aggressive lipid-lowering therapy.  She will see Haley Alar, NP for primary care.  I will be available on an as-needed basis from a sleep perspective if problems arise.   Medication Adjustments/Labs and Tests Ordered: Current medicines are reviewed at length with the patient today.  Concerns regarding medicines are outlined above.  Medication changes, Labs and Tests ordered today are listed in the Patient Instructions below. Patient Instructions  Medication Instructions:  The  current medical regimen is effective;  continue present plan and medications as directed. Please refer to the Current Medication list given to you today.  *If you need a refill on your cardiac medications before your next appointment, please call your pharmacy*  Lab Work:   Testing/Procedures:  NONE    NONE If you have labs (blood work) drawn today and your tests are completely normal, you will receive your  results only by: MyChart Message (if you have MyChart) OR  A paper copy in the mail If you have any lab test that is abnormal or we need to change your treatment, we will call you to review the results.  Follow-Up: Your next appointment:  AS NEEDED  In Person with DR Claiborne Billings     At [facility], you and your health needs are our priority.  As part of our continuing mission to provide you with exceptional heart care, we have created designated Provider Care Teams.  These Care Teams include your primary Cardiologist (physician) and Advanced Practice Providers (APPs -  Physician Assistants and Nurse Practitioners) who all work together to provide you with the care you need, when you need it.    Important Information About Sugar             Signed, Haley Majestic, MD, [facility], AASM Diplomate, American Board of Sleep Medicine 12/01/2021 1:24 PM    [facility] [address] [place], [place],   [address] Phone: [phone]

## 2021-11-25 LAB — DRUG TOX MONITOR 1 W/CONF, ORAL FLD

## 2021-12-03 ENCOUNTER — Ambulatory Visit: Payer: BC Managed Care – PPO | Admitting: Cardiology

## 2021-12-03 ENCOUNTER — Encounter: Payer: Self-pay | Admitting: Cardiology

## 2021-12-03 VITALS — BP 118/80 | HR 68 | Ht 62.0 in | Wt 172.6 lb

## 2021-12-03 DIAGNOSIS — I2510 Atherosclerotic heart disease of native coronary artery without angina pectoris: Secondary | ICD-10-CM

## 2021-12-03 DIAGNOSIS — G4733 Obstructive sleep apnea (adult) (pediatric): Secondary | ICD-10-CM

## 2021-12-03 DIAGNOSIS — I10 Essential (primary) hypertension: Secondary | ICD-10-CM | POA: Insufficient documentation

## 2021-12-03 DIAGNOSIS — E669 Obesity, unspecified: Secondary | ICD-10-CM

## 2021-12-03 DIAGNOSIS — E782 Mixed hyperlipidemia: Secondary | ICD-10-CM | POA: Insufficient documentation

## 2021-12-03 MED ORDER — EZETIMIBE 10 MG PO TABS: 10.0000 mg | ORAL_TABLET | Freq: Every day | ORAL | 3 refills | Status: DC

## 2021-12-03 MED ORDER — ATORVASTATIN CALCIUM 40 MG PO TABS: 40.0000 mg | ORAL_TABLET | Freq: Every day | ORAL | 3 refills | Status: DC

## 2021-12-03 NOTE — Progress Notes (Signed)
Cardiology Office Note:    Date:  12/03/2021   ID:  Haley Fuentes, DOB 1960/12/29, MRN [id number]  PCP:  [name], NP  Cardiologist:  Berniece Salines, DO  Electrophysiologist:  None   Referring MD: [name], NP    History of Present Illness:    Haley Fuentes is a 61 y.o. female with a hx of hyperlipidemia, nonobstructive CAD seen on coronary CTA, obesity and sleep apnea here today for follow-up visit.  At her last visit in December 2022 she was experiencing some headaches with the Imdur.  I stopped the Imdur started patient on Ranexa 500 mg twice a day.  We will continue her aspirin and statin.  I also added Zetia 10 mg to her regimen.  She also is experiencing some shortness of breath she is high risk for sleep apnea so I sent the patient for sleep study.  She did get a sleep study which showed sleep apnea.  She is here today for follow-up visit.  Since I saw the patient she has had COVID-19 infection back in April 2023.  During those time she stopped taking all her medications.  She has restarted most of them now.  She did not restart the Ranexa.  But she has started her atorvastatin.  She did not start the Zetia.  She also did see Dr. Claiborne Billings and is following him for sleep apnea.  She is interested in medical weight management.  Past Medical History:  Diagnosis Date   Anemia    hx of iron deficient anemia   Arthritis    Blood transfusion without reported diagnosis    Depression    Gastric polyp    GERD (gastroesophageal reflux disease)    Hemorrhoids    HLD (hyperlipidemia)    Joint pain    left shoulder   Kidney lesion    right   Osteopenia 05/04/2017   Postmenopausal     Past Surgical History:  Procedure Laterality Date   ENDOMETRIAL ABLATION  2007   [facility]   TUBAL LIGATION  1983    Current Medications: Current Meds  Medication Sig   ASPIRIN LOW DOSE 81 MG EC tablet TAKE ONE TABLET BY MOUTH DAILY *SWALLOW WHOLE   atorvastatin (LIPITOR) 40 MG  tablet Take 1 tablet (40 mg total) by mouth daily.   buPROPion (WELLBUTRIN XL) 300 MG 24 hr tablet TAKE 1 TABLET BY MOUTH DAILY **NEEDS APPOINTMENT!!**   Diclofenac Sodium (PENNSAID) 2 % SOLN Place 1 application onto the skin 2 (two) times daily. (Patient taking differently: Place 1 application. onto the skin as needed.)   ESTRACE VAGINAL 0.1 MG/GM vaginal cream Place 1 application vaginally at bedtime.   estradiol (ESTRACE) 0.5 MG tablet Take 0.5 mg by mouth daily.   ezetimibe (ZETIA) 10 MG tablet Take 1 tablet (10 mg total) by mouth daily.   medroxyPROGESTERone (PROVERA) 2.5 MG tablet Take 1 tablet (2.5 mg total) by mouth daily.   Multiple Vitamins-Minerals (MULTIVITAMIN WITH MINERALS) tablet 1 tablet.   omeprazole (PRILOSEC) 40 MG capsule Take 1 capsule (40 mg total) by mouth daily.   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    Employer: [employer]  Tobacco Use   Smoking status: Former    Types: Cigarettes    Quit date: 07/04/1985    Years since quitting: 36.4   Smokeless tobacco: Never  Vaping Use   Vaping Use: Never used  Substance and Sexual Activity   Alcohol use: Yes    Alcohol/week: 0.0 standard drinks    Comment: 4-5 times a week   Drug use: No   Sexual activity: Not on file  Other Topics Concern   Not on file  Social History Narrative   Regular exercise: 30 minutes 5 days a week   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       Social Determinants of Health   Financial Resource Strain: Not on file  Food Insecurity: Not on file  Transportation Needs: Not on file  Physical Activity: Not on file  Stress: Not on file  Social Connections: Not on file     Family History: The patient's family history includes Alcohol abuse in her brother; Anxiety disorder in her son; Breast cancer (age of onset: 76) in her mother; Depression in her son; Diabetes in her father and mother; Healthy in her daughter and son; Heart disease in her father and mother; Hepatitis in her brother; Raynaud syndrome in her sister; Stroke in her father.  ROS:   Review of Systems  Constitution: Negative for decreased appetite, fever and weight gain.  HENT: Negative for congestion, ear discharge, hoarse voice and sore throat.   Eyes: Negative for discharge, redness, vision loss in right eye and visual halos.  Cardiovascular: Negative for chest pain, dyspnea on exertion, leg swelling, orthopnea and palpitations.  Respiratory: Negative for cough, hemoptysis, shortness of breath and snoring.   Endocrine: Negative for heat intolerance and polyphagia.  Hematologic/Lymphatic: Negative for bleeding problem. Does not bruise/bleed easily.  Skin: Negative for flushing, nail changes, rash and suspicious lesions.  Musculoskeletal: Negative for arthritis, joint pain, muscle cramps, myalgias, neck pain and stiffness.  Gastrointestinal: Negative for abdominal pain, bowel incontinence, diarrhea and excessive appetite.  Genitourinary: Negative for decreased libido, genital sores and incomplete emptying.  Neurological: Negative for brief paralysis, focal weakness, headaches and loss of balance.  Psychiatric/Behavioral: Negative for altered mental status, depression and suicidal ideas.  Allergic/Immunologic: Negative for HIV exposure and persistent infections.    EKGs/Labs/Other Studies Reviewed:    The following studies were reviewed today:   EKG: None  today   TTE6/10/2019 Aorta: Normal size. Scattered calcifications in the ascending and descending aorta. No dissection.   Aortic Valve:  Trileaflet.  No calcifications.   Coronary Arteries:  Normal coronary origin.  Right dominance.   RCA is a large dominant artery that gives rise to PDA and PLVB. There is no plaque.   Left main is a large artery that gives rise to LAD, Ramus and LCX arteries. There is mild calcified plaque in the distal LM with associated stenosis of 25-49%.   LAD is a large vessel that gives risk to a large diagonal. There is mild noncalcified plaque in the mid LAD with associated stenosis of 25-49%.   The ramus is a small sized vessel with no plaque.   LCX is a non-dominant artery that gives rise to one large OM1 branch. There is  no plaque.   Other findings:   Normal pulmonary vein drainage into the left atrium.   Normal let atrial appendage without a thrombus.   Normal size of the pulmonary artery.   IMPRESSION: 1. Coronary calcium score of 53. This was 87th percentile for age and sex matched control.   2.  Normal coronary origin with right dominance.   3.  Mild atherosclerosis of the distal LM and mid LAD.  CAD-RADS=1.   4.  Consider non-atherosclerotic causes of chest pain.   5.  Recommend preventive therapy and risk factor modification.   6.  This study has been submitted for FFR analysis.   Fransico Him    Recent Labs: 05/31/2021: ALT 19; BUN 14; Creatinine, Ser 0.75; Hemoglobin 11.4; Platelets 248.0; Potassium 3.9; Sodium 140  Recent Lipid Panel    Component Value Date/Time   CHOL 307 (H) 11/19/2021 0830   CHOL 264 (H) 01/29/2020 0809   TRIG 219.0 (H) 11/19/2021 0830   HDL 48.80 11/19/2021 0830   HDL 60 01/29/2020 0809   CHOLHDL 6 11/19/2021 0830   VLDL 43.8 (H) 11/19/2021 0830   LDLCALC 123 (H) 05/31/2021 0931   LDLCALC 139 (H) 04/06/2020 0838   LDLDIRECT 203.0 11/19/2021 0830    Physical Exam:    VS:  BP 118/80   Pulse  68   Ht '5\' 2"'$  (1.575 m)   Wt 172 lb 9.6 oz (78.3 kg)   LMP 07/04/2004   SpO2 96%   BMI 31.57 kg/m     Wt Readings from Last 3 Encounters:  12/03/21 172 lb 9.6 oz (78.3 kg)  11/24/21 173 lb 9.6 oz (78.7 kg)  11/19/21 170 lb (77.1 kg)     GEN: Well nourished, well developed in no acute distress HEENT: Normal NECK: No JVD; No carotid bruits LYMPHATICS: No lymphadenopathy CARDIAC: S1S2 noted,RRR, no murmurs, rubs, gallops RESPIRATORY:  Clear to auscultation without rales, wheezing or rhonchi  ABDOMEN: Soft, non-tender, non-distended, +bowel sounds, no guarding. EXTREMITIES: No edema, No cyanosis, no clubbing MUSCULOSKELETAL:  No deformity  SKIN: Warm and dry NEUROLOGIC:  Alert and oriented x 3, non-focal PSYCHIATRIC:  Normal affect, good insight  ASSESSMENT:    1. Coronary artery disease involving native coronary artery of native heart without angina pectoris   2. OSA (obstructive sleep apnea)   3. Mixed hyperlipidemia   4. Primary hypertension   5. Obesity (BMI 30.0-34.9)    PLAN:     1.  CAD-no angina today.  She has not restarted her Ranexa since COVID-19 infection in April.  She is doing well.  So we will going to take this medication off her list.  She needs to continue with her atorvastatin 40 mg daily and we will again send her on Zetia 10 mg today.  Explained to the patient that she needs this for keeping her LDL goal less than 70.  She expresses understanding.  2.  Obstructive sleep apnea-she is on CPAP  3.  Hyperlipidemia-plan as listed above resume Lipitor 40 mg today add Zetia 10 mg today.  Repeat labs in 12 weeks.  4.  Obesity-we will refer the patient to our Pharm.D. clinic for medical weight loss management.  The patient is in agreement with the above plan. The patient left the office in stable condition.  The patient will follow up in   Medication Adjustments/Labs and Tests Ordered: Current medicines are reviewed at length with the patient today.   Concerns regarding medicines are outlined above.  Orders Placed This Encounter  Procedures   AMB Referral to [facility]   Meds ordered this encounter  Medications   ezetimibe (ZETIA) 10 MG tablet    Sig: Take 1 tablet (10 mg total) by mouth daily.    Dispense:  90 tablet    Refill:  3   atorvastatin (LIPITOR) 40 MG tablet    Sig: Take 1 tablet (40 mg total) by mouth daily.    Dispense:  90 tablet    Refill:  3    Patient Instructions  Medication Instructions:  Your physician has recommended you make the following change in your medication:  STOP: Ranexa START: Lipitor 40 mg once nightly START: Zetia 10 mg once daily *If you need a refill on your cardiac medications before your next appointment, please call your pharmacy*   Lab Work: Your physician recommends that you return for lab work in:  IN 12 WEEKS: Lipids If you have labs (blood work) drawn today and your tests are completely normal, you will receive your results only by: [place] (if you have [place]) OR A paper copy in the mail If you have any lab test that is abnormal or we need to change your treatment, we will call you to review the results.   Testing/Procedures: None   Follow-Up: At [facility], you and your health needs are our priority.  As part of our continuing mission to provide you with exceptional heart care, we have created designated Provider Care Teams.  These Care Teams include your primary Cardiologist (physician) and Advanced Practice Providers (APPs -  Physician Assistants and Nurse Practitioners) who all work together to provide you with the care you need, when you need it.  We recommend signing up for the patient portal called "MyChart".  Sign up information is provided on this After Visit Summary.  MyChart is used to connect with patients for Virtual Visits (Telemedicine).  Patients are able to view lab/test results, encounter notes, upcoming appointments, etc.  Non-urgent  messages can be sent to your provider as well.   To learn more about what you can do with MyChart, go to [email or web address].    Your next appointment:   1 year(s)  The format for your next appointment:   In Person  Provider:   Berniece Salines, DO     Other Instructions   Important Information About Sugar         Adopting a Healthy Lifestyle.  Know what a healthy weight is for you (roughly BMI <25) and aim to maintain this   Aim for 7+ servings of fruits and vegetables daily   65-80+ fluid ounces of water or unsweet tea for healthy kidneys   Limit to max 1 drink of alcohol per day; avoid smoking/tobacco   Limit animal fats in diet for cholesterol and heart health - choose grass fed whenever available   Avoid highly processed foods, and foods high in saturated/trans fats   Aim for low stress - take time to unwind and care for your mental health   Aim for 150 min of moderate intensity exercise weekly for heart health, and weights twice weekly for bone health   Aim for 7-9 hours of sleep daily   When it comes to diets, agreement about the perfect plan isnt easy to find, even among the experts. Experts at the [place] developed an idea known as the Healthy Eating Plate. Just imagine a plate divided into logical, healthy portions.   The emphasis is on diet quality:  Load up on vegetables and fruits - one-half of your plate: Aim for color and variety, and remember that potatoes dont count.   Go for whole grains - one-quarter of your plate: Whole wheat, barley, wheat berries, quinoa, oats, brown rice, and foods made with them. If you want pasta, go with whole wheat pasta.   Protein power - one-quarter of your plate: Fish, chicken, beans, and nuts are all healthy, versatile protein sources. Limit red meat.   The diet, however, does go beyond the plate, offering a few other suggestions.   Use healthy plant oils, such as olive, canola, soy,  corn, sunflower and peanut. Check the labels, and avoid partially hydrogenated oil, which have unhealthy trans fats.   If youre thirsty, drink water. Coffee and tea are good in moderation, but skip sugary drinks and limit milk and dairy products to one or two daily servings.   The type of carbohydrate in the diet is more important than the amount. Some sources of carbohydrates, such as vegetables, fruits, whole grains, and beans-are healthier than others.   Finally, stay active  Signed, Berniece Salines, DO  12/03/2021 8:21 AM    [place]

## 2021-12-03 NOTE — Patient Instructions (Addendum)
Medication Instructions:  Your physician has recommended you make the following change in your medication:  STOP: Ranexa START: Lipitor (Atorvastatin) 40 mg once nightly START: Zetia (Ezetimibe) 10 mg once daily *If you need a refill on your cardiac medications before your next appointment, please call your pharmacy*   Lab Work: Your physician recommends that you return for lab work in:  IN 12 WEEKS: Lipids If you have labs (blood work) drawn today and your tests are completely normal, you will receive your results only by: [place] (if you have MyChart) OR A paper copy in the mail If you have any lab test that is abnormal or we need to change your treatment, we will call you to review the results.   Testing/Procedures: None   Follow-Up: At [facility], you and your health needs are our priority.  As part of our continuing mission to provide you with exceptional heart care, we have created designated Provider Care Teams.  These Care Teams include your primary Cardiologist (physician) and Advanced Practice Providers (APPs -  Physician Assistants and Nurse Practitioners) who all work together to provide you with the care you need, when you need it.  We recommend signing up for the patient portal called "MyChart".  Sign up information is provided on this After Visit Summary.  MyChart is used to connect with patients for Virtual Visits (Telemedicine).  Patients are able to view lab/test results, encounter notes, upcoming appointments, etc.  Non-urgent messages can be sent to your provider as well.   To learn more about what you can do with MyChart, go to [email or web address].    Your next appointment:   1 year(s)  The format for your next appointment:   In Person  Provider:   Berniece Salines, DO     Other Instructions   Important Information About Sugar

## 2021-12-07 ENCOUNTER — Ambulatory Visit: Payer: BC Managed Care – PPO | Admitting: Pharmacist

## 2021-12-07 DIAGNOSIS — E782 Mixed hyperlipidemia: Secondary | ICD-10-CM | POA: Diagnosis not present

## 2021-12-07 DIAGNOSIS — I10 Essential (primary) hypertension: Secondary | ICD-10-CM

## 2021-12-07 DIAGNOSIS — Z8639 Personal history of other endocrine, nutritional and metabolic disease: Secondary | ICD-10-CM | POA: Insufficient documentation

## 2021-12-07 LAB — HEMOGLOBIN A1C
Est. average glucose Bld gHb Est-mCnc: 105 mg/dL
Hgb A1c MFr Bld: 5.3 % (ref 4.8–5.6)

## 2021-12-07 NOTE — Progress Notes (Signed)
Patient ID: Haley Fuentes                 DOB: Nov 22, 1960                    MRN: [id number]     HPI: Haley Fuentes is a 61 y.o. female patient referred to pharmacy clinic by Dr Harriet Masson to initiate weight loss therapy with GLP1-RA. PMH is significant for obesity, CAD, HTN, and HLD. Most recent BMI 31.99.  Patient presents today in good spirits. Reports has struggled with weight for last 5 years when multiple deaths in her family.  Placed on fluoxetine and now on Wellbutrin.  Tries to exercise about 20 minutes a day and is planning on starting weight training exercises but has not yet.  Has been through diet programs before such as Weight Watchers and has used various apps to tracker her eating.  Follows a fairly healthy diet including salads without dressing, yogurt, fish, and protein shakes. Reports she does splurge and eat pasta.  Patient needs A1c updated  Labs: Lab Results  Component Value Date   HGBA1C 5.4 04/14/2010    Wt Readings from Last 1 Encounters:  12/03/21 172 lb 9.6 oz (78.3 kg)    BP Readings from Last 1 Encounters:  12/03/21 118/80   Pulse Readings from Last 1 Encounters:  12/03/21 68       Component Value Date/Time   CHOL 307 (H) 11/19/2021 0830   CHOL 264 (H) 01/29/2020 0809   TRIG 219.0 (H) 11/19/2021 0830   HDL 48.80 11/19/2021 0830   HDL 60 01/29/2020 0809   CHOLHDL 6 11/19/2021 0830   VLDL 43.8 (H) 11/19/2021 0830   LDLCALC 123 (H) 05/31/2021 0931   LDLCALC 139 (H) 04/06/2020 0838   LDLDIRECT 203.0 11/19/2021 0830    Past Medical History:  Diagnosis Date   Anemia    hx of iron deficient anemia   Arthritis    Blood transfusion without reported diagnosis    Depression    Gastric polyp    GERD (gastroesophageal reflux disease)    Hemorrhoids    HLD (hyperlipidemia)    Joint pain    left shoulder   Kidney lesion    right   Osteopenia 05/04/2017   Postmenopausal     Current Outpatient Medications on File Prior to Visit  Medication Sig  Dispense Refill   ASPIRIN LOW DOSE 81 MG EC tablet TAKE ONE TABLET BY MOUTH DAILY *SWALLOW WHOLE 90 tablet 3   atorvastatin (LIPITOR) 40 MG tablet Take 1 tablet (40 mg total) by mouth daily. 90 tablet 3   buPROPion (WELLBUTRIN XL) 300 MG 24 hr tablet TAKE 1 TABLET BY MOUTH DAILY **NEEDS APPOINTMENT!!** 30 tablet 0   Diclofenac Sodium (PENNSAID) 2 % SOLN Place 1 application onto the skin 2 (two) times daily. (Patient taking differently: Place 1 application. onto the skin as needed.) 112 g 3   ESTRACE VAGINAL 0.1 MG/GM vaginal cream Place 1 application vaginally at bedtime.  1   estradiol (ESTRACE) 0.5 MG tablet Take 0.5 mg by mouth daily.     ezetimibe (ZETIA) 10 MG tablet Take 1 tablet (10 mg total) by mouth daily. 90 tablet 3   medroxyPROGESTERone (PROVERA) 2.5 MG tablet Take 1 tablet (2.5 mg total) by mouth daily.     Multiple Vitamins-Minerals (MULTIVITAMIN WITH MINERALS) tablet 1 tablet.     omeprazole (PRILOSEC) 40 MG capsule Take 1 capsule (40 mg total) by mouth daily. 30 capsule 3  zolpidem (AMBIEN) 10 MG tablet TAKE ONE HALF TO ONE TABLET BY MOUTH EVERY NIGHT AT BEDTIME AS NEEDED FOR SLEEP 15 tablet 0   No current facility-administered medications on file prior to visit.    Allergies  Allergen Reactions   Hydrocodone-Acetaminophen     "made her skin crawl"   Omni-Pac     vomitting   Voltaren [Diclofenac]     diarrhea     Assessment/Plan:  1. Weight loss - Patient adheres to a heart healthy diet and has received diet education in the past. Is interested in pharmacologic help.  Advised that the medications we typically recommend are wegovy, Saxenda, or Ozempic and Mounjaro if she was diabetic.  Will check A1c today to verify. Advised that many doses of Wegovy are back order currently as well.  However will complete PA for patient regardless. Confirmed patient not pregnant and no personal or family history of medullary thyroid carcinoma (MTC) or Multiple Endocrine Neoplasia  syndrome type 2 (MEN 2).   Advised patient on common side effects including nausea, diarrhea, dyspepsia, decreased appetite, and fatigue. Counseled patient on reducing meal size and how to titrate medication to minimize side effects. Counseled patient to call if intolerable side effects or if experiencing dehydration, abdominal pain, or dizziness. Patient will adhere to dietary modifications and will target at least 150 minutes of moderate intensity exercise weekly.   Karren Cobble, PharmD, BCACP, [place], [place], [place], [place], [address] Phone: [phone], Fax: [phone]

## 2021-12-07 NOTE — Patient Instructions (Addendum)
It was nice meeting you today  We will check your A1c today to confirm you are not diabetic  If not, we can consider the addition of a weight loss medication called Wegovy  I will complete the prior authorization for you and let you know what your plan says  Please call or send a message with any questions  Karren Cobble, PharmD, [place], [place], [place], [place], [place], [address] Phone: [phone], Fax: [phone]

## 2021-12-08 ENCOUNTER — Other Ambulatory Visit: Payer: Self-pay | Admitting: Family

## 2021-12-08 DIAGNOSIS — G4733 Obstructive sleep apnea (adult) (pediatric): Secondary | ICD-10-CM | POA: Diagnosis not present

## 2021-12-09 ENCOUNTER — Other Ambulatory Visit (HOSPITAL_BASED_OUTPATIENT_CLINIC_OR_DEPARTMENT_OTHER): Payer: Self-pay

## 2021-12-09 ENCOUNTER — Encounter: Payer: Self-pay | Admitting: Pharmacist

## 2021-12-09 MED ORDER — WEGOVY 0.25 MG/0.5ML ~~LOC~~ SOAJ
0.2500 mg | SUBCUTANEOUS | 0 refills | Status: DC
  Filled 2021-12-09: qty 2, 28d supply, fill #0

## 2021-12-14 ENCOUNTER — Other Ambulatory Visit: Payer: Self-pay | Admitting: Family

## 2021-12-15 NOTE — Progress Notes (Signed)
Synopsis: Referred for cough after covid-19 infection by [name], NP  Subjective:   PATIENT ID: Haley Fuentes GENDER: female DOB: [birth year], MRN: [id number]  Chief Complaint  Patient presents with   Pulmonary Consult    Referred by Dr [name]. Pt c/o cough since April 2023. Cough is non prod. She states she has had SOB for the past 6 months. She gets winded walking up and down stairs and with housework.    61yF with history of GERD - has had EGD before in past with mild gastritis noted in 2020, OSA on CPAP, anemia referred for cough after covid-19 infection 10/2021 - wasn't severe, wasn't hospitalized.   Coughs mostly at night, dry - started on empiric trial of PPI by PCP 11/19/21. She does think this has been helpful for cough.   Has had trouble with DOE for at least 6 months. She doesn't have orthopnea per se but feels more of a pressure sensation when she lies down. Doesn't feel sleepy during the day anymore since being on CPAP, has no trouble with machine. She has resmed Airsense autoPAP with app, uses diligently. She does occasionally note chest tightness. She has tried only an albuterol inhaler a few times without much change in her symptoms. She does think that a course of prednisone was helpful.   She has had normal ETT in 2016, nCAD on CT coronaries.   Otherwise pertinent review of systems is negative.  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 Very limited smoking history. Has 3 dogs. She has lived in [place] and [place].   Past Medical History:  Diagnosis Date   Anemia    hx of iron deficient anemia   Arthritis    Blood transfusion without reported diagnosis    Depression    Gastric polyp    GERD (gastroesophageal reflux disease)    Hemorrhoids    HLD (hyperlipidemia)    Joint pain    left shoulder   Kidney lesion    right   Osteopenia 05/04/2017   Postmenopausal      Family History  Problem  Relation Age of Onset   Diabetes Mother    Heart disease Mother    Breast cancer Mother 33   Stroke Father    Diabetes Father    Heart disease Father        CABG, AVR, A-Fib   Hepatitis Brother    Alcohol abuse Brother    Raynaud syndrome Sister    Healthy Daughter    Depression Son    Anxiety disorder Son    Healthy Son      Past Surgical History:  Procedure Laterality Date   ENDOMETRIAL ABLATION  2007   Novasure   TUBAL LIGATION  1983    Social History   Socioeconomic History   Marital status: Married    Spouse name: Not on file   Number of children: 2   Years of education: Not on file   Highest education level: Not on file  Occupational History   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[employer]    Employer: [employer]  Tobacco Use   Smoking status: Former    Packs/day: 0.25    Years: 4.00    Total pack years: 1.00    Types: Cigarettes    Quit date: 07/04/1985    Years since quitting: 36.4   Smokeless tobacco: Never  Vaping Use   Vaping Use: Never used  Substance and  Sexual Activity   Alcohol use: Yes    Alcohol/week: 0.0 standard drinks of alcohol    Comment: 4-5 times a week   Drug use: No   Sexual activity: Not on file  Other Topics Concern   Not on file  Social History Narrative   Regular exercise: 30 minutes 5 days a week   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      Social Determinants of Health   Financial Resource Strain: Not on file  Food Insecurity: Not on file  Transportation Needs: Not on file  Physical Activity: Not on file  Stress: Not on file  Social Connections: Not on file  Intimate Partner Violence: Not on file     Allergies  Allergen Reactions   Hydrocodone-Acetaminophen     "made her skin crawl"   Omni-Pac     vomitting   Voltaren [Diclofenac]     diarrhea     Outpatient Medications  Prior to Visit  Medication Sig Dispense Refill   albuterol (VENTOLIN HFA) 108 (90 Base) MCG/ACT inhaler Inhale 2 puffs into the lungs every 6 (six) hours as needed for wheezing or shortness of breath.     ASPIRIN LOW DOSE 81 MG EC tablet TAKE ONE TABLET BY MOUTH DAILY *SWALLOW WHOLE 90 tablet 3   atorvastatin (LIPITOR) 40 MG tablet Take 1 tablet (40 mg total) by mouth daily. 90 tablet 3   buPROPion (WELLBUTRIN XL) 300 MG 24 hr tablet Take 1 tablet (300 mg total) by mouth daily. 90 tablet 1   Diclofenac Sodium (PENNSAID) 2 % SOLN Place 1 application onto the skin 2 (two) times daily. (Patient taking differently: Place 1 application  onto the skin as needed.) 112 g 3   ESTRACE VAGINAL 0.1 MG/GM vaginal cream Place 1 application vaginally at bedtime.  1   estradiol (ESTRACE) 0.5 MG tablet Take 0.5 mg by mouth daily.     FLUoxetine (PROZAC) 40 MG capsule Take 1 capsule (40 mg total) by mouth daily. 90 capsule 0   medroxyPROGESTERone (PROVERA) 2.5 MG tablet Take 1 tablet (2.5 mg total) by mouth daily.     Multiple Vitamins-Minerals (MULTIVITAMIN WITH MINERALS) tablet 1 tablet.     omeprazole (PRILOSEC) 40 MG capsule Take 1 capsule (40 mg total) by mouth daily. 30 capsule 3   WEGOVY 0.25 MG/0.5ML SOAJ Inject 0.25 mg into the skin once a week. 2 mL 0   zolpidem (AMBIEN) 10 MG tablet TAKE ONE HALF TO ONE TABLET BY MOUTH EVERY NIGHT AT BEDTIME AS NEEDED FOR SLEEP 15 tablet 0   ezetimibe (ZETIA) 10 MG tablet Take 1 tablet (10 mg total) by mouth daily. 90 tablet 3   No facility-administered medications prior to visit.       Objective:   Physical Exam:  General appearance: 61 y.o., female, NAD, conversant  Eyes: anicteric sclerae; PERRL, tracking appropriately HENT: NCAT; MMM Neck: Trachea midline; no lymphadenopathy, no JVD Lungs: CTAB, no crackles, no wheeze, with normal respiratory effort CV: RRR, no murmur  Abdomen: Soft, non-tender; non-distended, BS present  Extremities: No peripheral  edema, warm Skin: Normal turgor and texture; no rash Psych: Appropriate affect Neuro: Alert and oriented to person and place, no focal deficit     Vitals:   12/16/21 0908  BP: 112/70  Pulse: 80  Temp: 98 F (36.7 C)  TempSrc: Oral  SpO2: 98%  Weight: 171 lb (77.6 kg)  Height: '5\' 2"'$  (1.575 m)   98% on RA BMI Readings from Last 3 Encounters:  12/16/21 31.28 kg/m  12/07/21 31.99 kg/m  12/03/21 31.57 kg/m   Wt Readings from Last 3 Encounters:  12/16/21 171 lb (77.6 kg)  12/07/21 174 lb 14.4 oz (79.3 kg)  12/03/21 172 lb 9.6 oz (78.3 kg)     CBC    Component Value Date/Time   WBC 6.0 05/31/2021 0931   RBC 3.60 (L) 05/31/2021 0931   HGB 11.4 (L) 05/31/2021 0931   HCT 33.8 (L) 05/31/2021 0931   PLT 248.0 05/31/2021 0931   MCV 93.8 05/31/2021 0931   MCH 30.7 10/17/2013 1032   MCHC 33.8 05/31/2021 0931   RDW 13.4 05/31/2021 0931   LYMPHSABS 1.9 05/31/2021 0931   MONOABS 0.3 05/31/2021 0931   EOSABS 0.3 05/31/2021 0931   BASOSABS 0.0 05/31/2021 0931    Chest Imaging: CXR 11/19/21 reviewed by me unremarkable    Pulmonary Functions Testing Results:     No data to display            Echocardiogram:   TTE 2023:  1. Left ventricular ejection fraction, by estimation, is 55 to 60%. The  left ventricle has normal function. The left ventricle has no regional  wall motion abnormalities. Left ventricular diastolic parameters are  consistent with Grade I diastolic  dysfunction (impaired relaxation).   2. Right ventricular systolic function is normal. The right ventricular  size is normal.   3. The mitral valve is normal in structure. Trivial mitral valve  regurgitation. No evidence of mitral stenosis.   4. The aortic valve is tricuspid. Aortic valve regurgitation is trivial.  No aortic stenosis is present.   5. The inferior vena cava is normal in size with greater than 50%  respiratory variability, suggesting right atrial pressure of 3 mmHg.        Assessment & Plan:   # Chronic cough # DOE Unclear etiology - possible that diastolic dysfunction, nCAD, deconditioning could be playing roles. Steroid response raises question of airways disease or less likely ILD.  # OSA on CPAP  Plan: - cbc/diff, BMP, BNP - PFTs next visit - albuterol 1-2 puffs with spacer as needed, reviewed technique   RTC 6 weeks with PFT     Maryjane Hurter, MD [facility] 12/16/2021 9:18 AM

## 2021-12-16 ENCOUNTER — Encounter: Payer: Self-pay | Admitting: Student

## 2021-12-16 ENCOUNTER — Ambulatory Visit: Payer: BC Managed Care – PPO | Admitting: Student

## 2021-12-16 VITALS — BP 112/70 | HR 80 | Temp 98.0°F | Ht 62.0 in | Wt 171.0 lb

## 2021-12-16 DIAGNOSIS — Z9989 Dependence on other enabling machines and devices: Secondary | ICD-10-CM | POA: Diagnosis not present

## 2021-12-16 DIAGNOSIS — G4733 Obstructive sleep apnea (adult) (pediatric): Secondary | ICD-10-CM

## 2021-12-16 DIAGNOSIS — R0609 Other forms of dyspnea: Secondary | ICD-10-CM | POA: Diagnosis not present

## 2021-12-16 LAB — CBC WITH DIFFERENTIAL/PLATELET
Basophils Absolute: 0 10*3/uL (ref 0.0–0.1)
Basophils Relative: 0.4 % (ref 0.0–3.0)
Eosinophils Absolute: 0.2 10*3/uL (ref 0.0–0.7)
Eosinophils Relative: 3.3 % (ref 0.0–5.0)
HCT: 36 % (ref 36.0–46.0)
Hemoglobin: 12 g/dL (ref 12.0–15.0)
Lymphocytes Relative: 30.5 % (ref 12.0–46.0)
Lymphs Abs: 2.2 10*3/uL (ref 0.7–4.0)
MCHC: 33.2 g/dL (ref 30.0–36.0)
MCV: 94.3 fl (ref 78.0–100.0)
Monocytes Absolute: 0.5 10*3/uL (ref 0.1–1.0)
Monocytes Relative: 6.5 % (ref 3.0–12.0)
Neutro Abs: 4.3 10*3/uL (ref 1.4–7.7)
Neutrophils Relative %: 59.3 % (ref 43.0–77.0)
Platelets: 258 10*3/uL (ref 150.0–400.0)
RBC: 3.82 Mil/uL — ABNORMAL LOW (ref 3.87–5.11)
RDW: 13.5 % (ref 11.5–15.5)
WBC: 7.2 10*3/uL (ref 4.0–10.5)

## 2021-12-16 LAB — BASIC METABOLIC PANEL
BUN: 16 mg/dL (ref 6–23)
CO2: 26 mEq/L (ref 19–32)
Calcium: 9.6 mg/dL (ref 8.4–10.5)
Chloride: 105 mEq/L (ref 96–112)
Creatinine, Ser: 0.76 mg/dL (ref 0.40–1.20)
GFR: 84.88 mL/min (ref >60.00)
Glucose, Bld: 79 mg/dL (ref 70–99)
Potassium: 3.8 mEq/L (ref 3.5–5.1)
Sodium: 142 mEq/L (ref 135–145)

## 2021-12-16 LAB — BRAIN NATRIURETIC PEPTIDE: Pro B Natriuretic peptide (BNP): 17 pg/mL (ref 0.0–100.0)

## 2021-12-16 LAB — TSH: TSH: 1.85 u[IU]/mL (ref 0.35–5.50)

## 2021-12-16 MED ORDER — AEROCHAMBER MV MISC: 0 refills | Status: DC

## 2021-12-16 NOTE — Patient Instructions (Addendum)
-   bloodwork today before you go - you will be called to schedule breathing tests (PFTs) - albuterol as needed with spacer 1-2 puffs - see you in 6 weeks or sooner as needed!

## 2021-12-20 ENCOUNTER — Other Ambulatory Visit (HOSPITAL_BASED_OUTPATIENT_CLINIC_OR_DEPARTMENT_OTHER): Payer: Self-pay

## 2021-12-20 ENCOUNTER — Encounter: Payer: Self-pay | Admitting: Family

## 2021-12-20 ENCOUNTER — Encounter: Payer: Self-pay | Admitting: Pharmacist

## 2021-12-20 ENCOUNTER — Other Ambulatory Visit: Payer: Self-pay | Admitting: Family

## 2021-12-20 MED ORDER — WEGOVY 0.5 MG/0.5ML ~~LOC~~ SOAJ
0.5000 mg | SUBCUTANEOUS | 0 refills | Status: DC
  Filled 2021-12-20: qty 2, 28d supply, fill #0

## 2021-12-20 MED ORDER — WEGOVY 0.5 MG/0.5ML ~~LOC~~ SOAJ: 0.5000 mg | SUBCUTANEOUS | 0 refills | Status: DC

## 2021-12-20 NOTE — Addendum Note (Signed)
Addended by: Rollen Sox on: 12/20/2021 04:12 PM   Modules accepted: Orders

## 2021-12-21 MED ORDER — ALBUTEROL SULFATE HFA 108 (90 BASE) MCG/ACT IN AERS: 2.0000 | INHALATION_SPRAY | Freq: Four times a day (QID) | RESPIRATORY_TRACT | 0 refills | Status: DC | PRN

## 2022-01-13 ENCOUNTER — Other Ambulatory Visit (HOSPITAL_BASED_OUTPATIENT_CLINIC_OR_DEPARTMENT_OTHER): Payer: Self-pay

## 2022-01-13 MED ORDER — WEGOVY 1 MG/0.5ML ~~LOC~~ SOAJ
1.0000 mg | SUBCUTANEOUS | 0 refills | Status: DC
  Filled 2022-01-13: qty 2, 28d supply, fill #0

## 2022-01-13 NOTE — Addendum Note (Signed)
Addended by: Rollen Sox on: 01/13/2022 05:01 PM   Modules accepted: Orders

## 2022-01-14 ENCOUNTER — Other Ambulatory Visit (HOSPITAL_BASED_OUTPATIENT_CLINIC_OR_DEPARTMENT_OTHER): Payer: Self-pay

## 2022-01-27 ENCOUNTER — Ambulatory Visit: Payer: BC Managed Care – PPO | Admitting: Student

## 2022-02-11 ENCOUNTER — Other Ambulatory Visit (HOSPITAL_BASED_OUTPATIENT_CLINIC_OR_DEPARTMENT_OTHER): Payer: Self-pay

## 2022-02-11 MED ORDER — WEGOVY 1.7 MG/0.75ML ~~LOC~~ SOAJ
1.7000 mg | SUBCUTANEOUS | 2 refills | Status: DC
  Filled 2022-02-11: qty 3, 28d supply, fill #0
  Filled 2022-03-09 (×2): qty 3, 28d supply, fill #1
  Filled 2022-04-08: qty 3, 28d supply, fill #2

## 2022-02-11 NOTE — Addendum Note (Signed)
Addended by: Rollen Sox on: 02/11/2022 04:34 PM   Modules accepted: Orders

## 2022-03-05 ENCOUNTER — Other Ambulatory Visit: Payer: Self-pay | Admitting: Family

## 2022-03-06 ENCOUNTER — Other Ambulatory Visit: Payer: Self-pay | Admitting: Family

## 2022-03-08 ENCOUNTER — Other Ambulatory Visit: Payer: Self-pay | Admitting: Family

## 2022-03-08 DIAGNOSIS — R059 Cough, unspecified: Secondary | ICD-10-CM

## 2022-03-09 ENCOUNTER — Other Ambulatory Visit (HOSPITAL_BASED_OUTPATIENT_CLINIC_OR_DEPARTMENT_OTHER): Payer: Self-pay

## 2022-03-17 ENCOUNTER — Ambulatory Visit: Payer: BC Managed Care – PPO | Admitting: Family Medicine

## 2022-03-17 ENCOUNTER — Ambulatory Visit: Payer: Self-pay

## 2022-03-17 ENCOUNTER — Encounter: Payer: Self-pay | Admitting: Family Medicine

## 2022-03-17 VITALS — Ht 62.0 in | Wt 171.0 lb

## 2022-03-17 DIAGNOSIS — M79671 Pain in right foot: Secondary | ICD-10-CM

## 2022-03-17 DIAGNOSIS — M722 Plantar fascial fibromatosis: Secondary | ICD-10-CM | POA: Diagnosis not present

## 2022-03-17 DIAGNOSIS — M67873 Other specified disorders of tendon, right ankle and foot: Secondary | ICD-10-CM | POA: Insufficient documentation

## 2022-03-17 NOTE — Assessment & Plan Note (Signed)
Acutely occurring.  Having significant enlargement of the midportion of the Achilles. -Counseled on home exercise therapy and supportive care. -Green sport insoles with heel lift. -Counseled on nitro patches -Could consider shockwave therapy

## 2022-03-17 NOTE — Patient Instructions (Signed)
Good to see you Please try the insoles  Please try the nitro patches  Please try the stretches   Please send me a message in MyChart with any questions or updates.  Please see me back in 4 weeks.   --Dr. Raeford Razor  Nitroglycerin Protocol  Apply 1/4 nitroglycerin patch to affected area daily. Change position of patch within the affected area every 24 hours. You may experience a headache during the first 1-2 weeks of using the patch, these should subside. If you experience headaches after beginning nitroglycerin patch treatment, you may take your preferred over the counter pain reliever. Another side effect of the nitroglycerin patch is skin irritation or rash related to patch adhesive. Please notify our office if you develop more severe headaches or rash, and stop the patch. Tendon healing with nitroglycerin patch may require 12 to 24 weeks depending on the extent of injury. Men should not use if taking Viagra, Cialis, or Levitra.  Do not use if you have migraines or rosacea.

## 2022-03-17 NOTE — Assessment & Plan Note (Signed)
Acutely occurring.  Symptoms more in the mid substance of the plantar fascia. -Counseled on home exercise therapy and supportive care. -Midfoot arch strap. -Consider physical therapy

## 2022-03-17 NOTE — Progress Notes (Signed)
  Haley Fuentes - 61 y.o. female MRN [id number]  Date of birth: 02-05-1961  SUBJECTIVE:  Including CC & ROS.  No chief complaint on file.   Haley Fuentes is a 61 y.o. female that is presenting with acute right foot and Achilles pain.  The pain has been ongoing for 2 weeks.  She denies any new or different activities.   Review of Systems See HPI   HISTORY: Past Medical, Surgical, Social, and Family History Reviewed & Updated per EMR.   Pertinent Historical Findings include:  Past Medical History:  Diagnosis Date   Anemia    hx of iron deficient anemia   Arthritis    Blood transfusion without reported diagnosis    Depression    Gastric polyp    GERD (gastroesophageal reflux disease)    Hemorrhoids    HLD (hyperlipidemia)    Joint pain    left shoulder   Kidney lesion    right   Osteopenia 05/04/2017   Postmenopausal     Past Surgical History:  Procedure Laterality Date   ENDOMETRIAL ABLATION  2007   Novasure   TUBAL LIGATION  1983     PHYSICAL EXAM:  VS: Ht '5\' 2"'$  (1.575 m)   Wt 171 lb (77.6 kg)   LMP 07/04/2004   BMI 31.28 kg/m  Physical Exam Gen: NAD, alert, cooperative with exam, well-appearing MSK:  Neurovascularly intact    Limited ultrasound: Right foot and ankle:  Enlargement of the midportion of the Achilles. Normal insertion of the Achilles. No hyperemia of the midportion of the Achilles. Normal-appearing origin of the plantar fascia. Having thickening of the mid substance of the plantar fascia.  Summary: Findings consistent with plantar fibroma and Achilles tendinopathy  Ultrasound and interpretation by Clearance Coots, MD    ASSESSMENT & PLAN:   Plantar fascial fibromatosis of right foot Acutely occurring.  Symptoms more in the mid substance of the plantar fascia. -Counseled on home exercise therapy and supportive care. -Midfoot arch strap. -Consider physical therapy  Achilles tendinosis of right ankle Acutely occurring.  Having significant  enlargement of the midportion of the Achilles. -Counseled on home exercise therapy and supportive care. -Green sport insoles with heel lift. -Counseled on nitro patches -Could consider shockwave therapy

## 2022-03-21 ENCOUNTER — Ambulatory Visit: Payer: BC Managed Care – PPO | Admitting: Family Medicine

## 2022-03-21 ENCOUNTER — Other Ambulatory Visit (HOSPITAL_COMMUNITY): Payer: Self-pay

## 2022-04-08 ENCOUNTER — Other Ambulatory Visit (HOSPITAL_BASED_OUTPATIENT_CLINIC_OR_DEPARTMENT_OTHER): Payer: Self-pay

## 2022-04-12 ENCOUNTER — Encounter: Payer: Self-pay | Admitting: Family Medicine

## 2022-04-15 ENCOUNTER — Ambulatory Visit: Payer: BC Managed Care – PPO | Admitting: Family Medicine

## 2022-04-25 ENCOUNTER — Other Ambulatory Visit: Payer: Self-pay | Admitting: Cardiology

## 2022-05-12 ENCOUNTER — Other Ambulatory Visit (HOSPITAL_BASED_OUTPATIENT_CLINIC_OR_DEPARTMENT_OTHER): Payer: Self-pay

## 2022-05-12 ENCOUNTER — Other Ambulatory Visit: Payer: Self-pay | Admitting: Cardiology

## 2022-05-13 ENCOUNTER — Other Ambulatory Visit (HOSPITAL_BASED_OUTPATIENT_CLINIC_OR_DEPARTMENT_OTHER): Payer: Self-pay

## 2022-05-13 MED ORDER — WEGOVY 1.7 MG/0.75ML ~~LOC~~ SOAJ
1.7000 mg | SUBCUTANEOUS | 2 refills | Status: DC
  Filled 2022-05-13: qty 3, 28d supply, fill #0
  Filled 2022-06-08: qty 3, 28d supply, fill #1
  Filled 2022-07-07: qty 3, 28d supply, fill #2

## 2022-05-13 NOTE — Addendum Note (Signed)
Addended by: Rollen Sox on: 05/13/2022 07:48 AM   Modules accepted: Orders

## 2022-05-16 ENCOUNTER — Telehealth: Payer: Self-pay | Admitting: Pharmacist

## 2022-05-16 ENCOUNTER — Other Ambulatory Visit (HOSPITAL_BASED_OUTPATIENT_CLINIC_OR_DEPARTMENT_OTHER): Payer: Self-pay

## 2022-05-16 NOTE — Telephone Encounter (Signed)
PA renewal for [facility] received. Key: [id number] sent via [facility]

## 2022-05-17 ENCOUNTER — Other Ambulatory Visit (HOSPITAL_BASED_OUTPATIENT_CLINIC_OR_DEPARTMENT_OTHER): Payer: Self-pay

## 2022-05-20 DIAGNOSIS — Z124 Encounter for screening for malignant neoplasm of cervix: Secondary | ICD-10-CM | POA: Diagnosis not present

## 2022-05-20 DIAGNOSIS — Z1231 Encounter for screening mammogram for malignant neoplasm of breast: Secondary | ICD-10-CM | POA: Diagnosis not present

## 2022-05-20 DIAGNOSIS — Z01419 Encounter for gynecological examination (general) (routine) without abnormal findings: Secondary | ICD-10-CM | POA: Diagnosis not present

## 2022-05-20 DIAGNOSIS — Z6826 Body mass index (BMI) 26.0-26.9, adult: Secondary | ICD-10-CM | POA: Diagnosis not present

## 2022-05-20 LAB — HM MAMMOGRAPHY

## 2022-05-20 LAB — HM PAP SMEAR: HM Pap smear: NEGATIVE

## 2022-06-03 ENCOUNTER — Other Ambulatory Visit: Payer: Self-pay | Admitting: Family

## 2022-06-03 DIAGNOSIS — R059 Cough, unspecified: Secondary | ICD-10-CM

## 2022-06-08 ENCOUNTER — Other Ambulatory Visit (HOSPITAL_BASED_OUTPATIENT_CLINIC_OR_DEPARTMENT_OTHER): Payer: Self-pay

## 2022-06-20 DIAGNOSIS — G4733 Obstructive sleep apnea (adult) (pediatric): Secondary | ICD-10-CM | POA: Diagnosis not present

## 2022-06-28 ENCOUNTER — Other Ambulatory Visit: Payer: Self-pay | Admitting: Family

## 2022-06-28 NOTE — Telephone Encounter (Signed)
Please contact pt to schedule a follow up appointment.  °

## 2022-07-05 ENCOUNTER — Encounter: Payer: Self-pay | Admitting: Family Medicine

## 2022-07-08 ENCOUNTER — Ambulatory Visit: Payer: Self-pay

## 2022-07-08 ENCOUNTER — Other Ambulatory Visit (HOSPITAL_BASED_OUTPATIENT_CLINIC_OR_DEPARTMENT_OTHER): Payer: Self-pay

## 2022-07-08 ENCOUNTER — Ambulatory Visit: Payer: BC Managed Care – PPO | Admitting: Family Medicine

## 2022-07-08 ENCOUNTER — Encounter: Payer: Self-pay | Admitting: Family Medicine

## 2022-07-08 VITALS — BP 130/84 | Ht 62.0 in | Wt 145.0 lb

## 2022-07-08 DIAGNOSIS — M65351 Trigger finger, right little finger: Secondary | ICD-10-CM

## 2022-07-08 MED ORDER — TRIAMCINOLONE ACETONIDE 40 MG/ML IJ SUSP
40.0000 mg | Freq: Once | INTRAMUSCULAR | Status: AC
  Administered 2022-07-08: 40 mg via INTRA_ARTICULAR

## 2022-07-08 NOTE — Patient Instructions (Signed)
Good to see you Please use splint at night for 3 weeks   Please send me a message in MyChart with any questions or updates.  Please see me back 4 weeks or as needed.   --Dr. Raeford Razor

## 2022-07-08 NOTE — Progress Notes (Signed)
  Haley Fuentes - 62 y.o. female MRN [id number]  Date of birth: 1960/07/17  SUBJECTIVE:  Including CC & ROS.  No chief complaint on file.   Haley Fuentes is a 62 y.o. female that is presenting with acute right pinky trigger finger.  Has been ongoing for 1 month.  Painful in the palmar aspect of the hand.    Review of Systems See HPI   HISTORY: Past Medical, Surgical, Social, and Family History Reviewed & Updated per EMR.   Pertinent Historical Findings include:  Past Medical History:  Diagnosis Date   Anemia    hx of iron deficient anemia   Arthritis    Blood transfusion without reported diagnosis    Depression    Gastric polyp    GERD (gastroesophageal reflux disease)    Hemorrhoids    HLD (hyperlipidemia)    Joint pain    left shoulder   Kidney lesion    right   Osteopenia 05/04/2017   Postmenopausal     Past Surgical History:  Procedure Laterality Date   ENDOMETRIAL ABLATION  2007   Novasure   TUBAL LIGATION  1983     PHYSICAL EXAM:  VS: BP 130/84   Ht '5\' 2"'$  (1.575 m)   Wt 145 lb (65.8 kg)   LMP 07/04/2004   BMI 26.52 kg/m  Physical Exam Gen: NAD, alert, cooperative with exam, well-appearing MSK:  Neurovascularly intact    Limited ultrasound: Right pinky finger pain.:  Nodule appreciated on the flexor tendon of the fifth volar surface of the tendon  Summary: Findings consistent with pinky trigger finger  Ultrasound and interpretation by Clearance Coots, MD  Aspiration/Injection Procedure Note Haley Fuentes 08-05-1960  Procedure: Injection Indications: Right pinky finger trigger finger  Procedure Details Consent: Risks of procedure as well as the alternatives and risks of each were explained to the (patient/caregiver).  Consent for procedure obtained. Time Out: Verified patient identification, verified procedure, site/side was marked, verified correct patient position, special equipment/implants available, medications/allergies/relevent history reviewed,  required imaging and test results available.  Performed.  The area was cleaned with iodine and alcohol swabs.    The right pinky finger volar tendon was injected with 1 cc of 40 mg Kenalog and 1 cc of 0.25% bupivacaine on a 25-gauge 1-1/2 inch needle.  Ultrasound was used. Images were obtained in long views showing the injection.     A sterile dressing was applied.  Patient did tolerate procedure well.     ASSESSMENT & PLAN:   Trigger little finger of right hand Acutely occurring.  Triggering evident on exam. -Counseled on home exercise therapy and supportive care. -Injection today. -Counseled on splinting. -Could consider repeat injection

## 2022-07-08 NOTE — Assessment & Plan Note (Signed)
Acutely occurring.  Triggering evident on exam. -Counseled on home exercise therapy and supportive care. -Injection today. -Counseled on splinting. -Could consider repeat injection

## 2022-07-16 ENCOUNTER — Other Ambulatory Visit (HOSPITAL_BASED_OUTPATIENT_CLINIC_OR_DEPARTMENT_OTHER): Payer: Self-pay

## 2022-07-20 ENCOUNTER — Other Ambulatory Visit (HOSPITAL_BASED_OUTPATIENT_CLINIC_OR_DEPARTMENT_OTHER): Payer: Self-pay

## 2022-07-22 ENCOUNTER — Telehealth: Payer: Self-pay | Admitting: Family

## 2022-07-22 ENCOUNTER — Ambulatory Visit: Payer: BC Managed Care – PPO | Admitting: Family

## 2022-07-22 VITALS — BP 130/88 | HR 84 | Temp 97.9°F | Resp 18 | Ht 62.0 in | Wt 147.0 lb

## 2022-07-22 DIAGNOSIS — Z79899 Other long term (current) drug therapy: Secondary | ICD-10-CM | POA: Diagnosis not present

## 2022-07-22 DIAGNOSIS — E785 Hyperlipidemia, unspecified: Secondary | ICD-10-CM | POA: Diagnosis not present

## 2022-07-22 DIAGNOSIS — I10 Essential (primary) hypertension: Secondary | ICD-10-CM

## 2022-07-22 DIAGNOSIS — G4700 Insomnia, unspecified: Secondary | ICD-10-CM

## 2022-07-22 DIAGNOSIS — G4733 Obstructive sleep apnea (adult) (pediatric): Secondary | ICD-10-CM

## 2022-07-22 LAB — LIPID PANEL
Cholesterol: 167 mg/dL (ref 0–200)
HDL: 58.1 mg/dL (ref >39.00)
LDL Cholesterol: 84 mg/dL (ref 0–99)
NonHDL: 108.93
Total CHOL/HDL Ratio: 3
Triglycerides: 125 mg/dL (ref 0.0–149.0)
VLDL: 25 mg/dL (ref 0.0–40.0)

## 2022-07-22 LAB — BASIC METABOLIC PANEL
BUN: 13 mg/dL (ref 6–23)
CO2: 27 mEq/L (ref 19–32)
Calcium: 9.2 mg/dL (ref 8.4–10.5)
Chloride: 104 mEq/L (ref 96–112)
Creatinine, Ser: 0.71 mg/dL (ref 0.40–1.20)
GFR: 91.72 mL/min (ref >60.00)
Glucose, Bld: 85 mg/dL (ref 70–99)
Potassium: 4.3 mEq/L (ref 3.5–5.1)
Sodium: 142 mEq/L (ref 135–145)

## 2022-07-22 MED ORDER — ZOLPIDEM TARTRATE 10 MG PO TABS: ORAL_TABLET | ORAL | 0 refills | Status: DC

## 2022-07-22 NOTE — Assessment & Plan Note (Addendum)
She is wearing cpap.  Discussed that if she maintains her new weight for 6 months we could consider repeating sleep study.

## 2022-07-22 NOTE — Assessment & Plan Note (Signed)
Maintained on statin.  Lab Results  Component Value Date   CHOL 307 (H) 11/19/2021   HDL 48.80 11/19/2021   LDLCALC 123 (H) 05/31/2021   LDLDIRECT 203.0 11/19/2021   TRIG 219.0 (H) 11/19/2021   CHOLHDL 6 11/19/2021   She is hoping lipids will be improved with her weight loss and statin.

## 2022-07-22 NOTE — Telephone Encounter (Signed)
Please call Dr. Malachi Carl office to request pap and mammmo.

## 2022-07-22 NOTE — Telephone Encounter (Signed)
Records release faxed 

## 2022-07-22 NOTE — Addendum Note (Signed)
Addended by: Kelle Darting A on: 07/22/2022 07:55 AM   Modules accepted: Orders

## 2022-07-22 NOTE — Progress Notes (Signed)
Subjective:     Patient ID: Haley Fuentes, female    DOB: 14-Sep-1960, 62 y.o.   MRN: [id number]  Chief Complaint  Patient presents with   Follow-up    HPI Patient is in today for follow up.  Depression- mood is stable- only taking wellbutrin, stopped prozac.   She started [facility] in May.  She has lost a good bit of weight.   Wt Readings from Last 3 Encounters:  07/22/22 147 lb (66.7 kg)  07/08/22 145 lb (65.8 kg)  03/17/22 171 lb (77.6 kg)   Insomnia- uses ambien some nights.    OSA- continues cpap.   Hyperlipidemia- now on statin.   Declines flu shot.  Health Maintenance Due  Topic Date Due   HIV Screening  Never done   PAP SMEAR-Modifier  03/22/2020   MAMMOGRAM  12/19/2021   COVID-19 Vaccine (4 - 2023-24 season) 03/04/2022    Past Medical History:  Diagnosis Date   Anemia    hx of iron deficient anemia   Arthritis    Blood transfusion without reported diagnosis    Depression    Gastric polyp    GERD (gastroesophageal reflux disease)    Hemorrhoids    HLD (hyperlipidemia)    Joint pain    left shoulder   Kidney lesion    right   Osteopenia 05/04/2017   Postmenopausal     Past Surgical History:  Procedure Laterality Date   ENDOMETRIAL ABLATION  2007   Novasure   TUBAL LIGATION  1983    Family History  Problem Relation Age of Onset   Diabetes Mother    Heart disease Mother    Breast cancer Mother 15   Stroke Father    Diabetes Father    Heart disease Father        CABG, AVR, A-Fib   Hepatitis Brother    Alcohol abuse Brother    Raynaud syndrome Sister    Healthy Daughter    Depression Son    Anxiety disorder Son    Healthy Son     Social History   Socioeconomic History   Marital status: Married    Spouse name: Not on file   Number of children: 2   Years of education: Not on file   Highest education level: Not on file  Occupational History   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    Employer: [employer]  Tobacco Use   Smoking  status: Former    Packs/day: 0.25    Years: 4.00    Total pack years: 1.00    Types: Cigarettes    Quit date: 07/04/1985    Years since quitting: 37.0   Smokeless tobacco: Never  Vaping Use   Vaping Use: Never used  Substance and Sexual Activity   Alcohol use: Yes    Alcohol/week: 0.0 standard drinks of alcohol    Comment: 4-5 times a week   Drug use: No   Sexual activity: Not on file  Other Topics Concern   Not on file  Social History Narrative   Regular exercise: 30 minutes 5 days a week   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      Social Determinants of Health   Financial Resource Strain: Not on file  Food Insecurity: Not on file  Transportation Needs: Not on file  Physical Activity: Not on file  Stress: Not on file  Social Connections: Not on file  Intimate Partner Violence: Not on file    Outpatient Medications Prior to Visit  Medication Sig Dispense Refill   albuterol (VENTOLIN HFA) 108 (90 Base) MCG/ACT inhaler Inhale 2 puffs into the lungs every 6 (six) hours as needed for wheezing or shortness of breath. 8 g 0   ASPIRIN LOW DOSE 81 MG tablet TAKE ONE TABLET BY MOUTH DAILY *SWALLOW WHOLE* 90 tablet 3   atorvastatin (LIPITOR) 40 MG tablet Take 1 tablet (40 mg total) by mouth daily. 90 tablet 3   buPROPion (WELLBUTRIN XL) 300 MG 24 hr tablet TAKE 1 TABLET BY MOUTH DAILY 90 tablet 0   cyclobenzaprine (FLEXERIL) 10 MG tablet TAKE 1 TABLET BY MOUTH TWO TIMES A DAY AS NEEDED FOR MUSCLE SPASMS 180 tablet 0   Diclofenac Sodium (PENNSAID) 2 % SOLN Place 1 application onto the skin 2 (two) times daily. (Patient taking differently: Place 1 application  onto the skin as needed.) 112 g 3   ESTRACE VAGINAL 0.1 MG/GM vaginal cream Place 1 application vaginally at bedtime.  1   estradiol (ESTRACE) 0.5 MG tablet Take 0.5 mg by mouth  daily.     medroxyPROGESTERone (PROVERA) 2.5 MG tablet Take 1 tablet (2.5 mg total) by mouth daily.     Multiple Vitamins-Minerals (MULTIVITAMIN WITH MINERALS) tablet 1 tablet.     omeprazole (PRILOSEC) 40 MG capsule TAKE 1 CAPSULE BY MOUTH DAILY 90 capsule 0   Semaglutide-Weight Management (WEGOVY) 1.7 MG/0.75ML SOAJ Inject 1.7 mg into the skin once a week. 3 mL 2   Spacer/Aero-Holding Chambers (AEROCHAMBER MV) inhaler Use as instructed 1 each 0   FLUoxetine (PROZAC) 40 MG capsule Take 1 capsule (40 mg total) by mouth daily. 90 capsule 0   zolpidem (AMBIEN) 10 MG tablet TAKE ONE HALF TO ONE TABLET BY MOUTH EVERY NIGHT AT BEDTIME AS NEEDED FOR SLEEP 15 tablet 0   No facility-administered medications prior to visit.    Allergies  Allergen Reactions   Hydrocodone-Acetaminophen     "made her skin crawl"   Omni-Pac     vomitting   Voltaren [Diclofenac]     diarrhea    ROS    See HPI  Objective:    Physical Exam Constitutional:      General: She is not in acute distress.    Appearance: Normal appearance. She is well-developed.  HENT:     Head: Normocephalic and atraumatic.     Right Ear: External ear normal.     Left Ear: External ear normal.  Eyes:     General: No scleral icterus. Neck:     Thyroid: No thyromegaly.  Cardiovascular:     Rate and Rhythm: Normal rate and regular rhythm.     Heart sounds: Normal heart sounds. No murmur heard. Pulmonary:     Effort: Pulmonary effort is normal. No respiratory distress.     Breath sounds: Normal breath sounds. No wheezing.  Musculoskeletal:     Cervical back: Neck supple.  Skin:    General: Skin is warm and dry.  Neurological:     Mental Status: She is alert and oriented to person, place, and time.  Psychiatric:        Mood and Affect: Mood normal.        Behavior: Behavior normal.        Thought Content: Thought content normal.        Judgment: Judgment normal.     BP 130/88  Pulse 84   Temp 97.9 F (36.6 C)    Resp 18   Ht '5\' 2"'$  (1.575 m)   Wt 147 lb (66.7 kg)   LMP 07/04/2004   SpO2 98%   BMI 26.89 kg/m  Wt Readings from Last 3 Encounters:  07/22/22 147 lb (66.7 kg)  07/08/22 145 lb (65.8 kg)  03/17/22 171 lb (77.6 kg)       Assessment & Plan:   Problem List Items Addressed This Visit       Unprioritized   Primary hypertension     BP Readings from Last 3 Encounters:  07/22/22 130/88  07/08/22 130/84  12/16/21 112/70  BP stable without medication.  Monitor.       Relevant Orders   Basic Metabolic Panel (BMET)   OSA (obstructive sleep apnea)    She is wearing cpap.  Discussed that if she maintains her new weight for 6 months we could consider repeating sleep study.       Hyperlipidemia    Maintained on statin.  Lab Results  Component Value Date   CHOL 307 (H) 11/19/2021   HDL 48.80 11/19/2021   LDLCALC 123 (H) 05/31/2021   LDLDIRECT 203.0 11/19/2021   TRIG 219.0 (H) 11/19/2021   CHOLHDL 6 11/19/2021  She is hoping lipids will be improved with her weight loss and statin.       Relevant Orders   Lipid panel   Other Visit Diagnoses     Insomnia, unspecified type    -  Primary   Relevant Medications   zolpidem (AMBIEN) 10 MG tablet   Other Relevant Orders   DRUG MONITORING, PANEL 8 WITH CONFIRMATION, URINE       I have discontinued Alayasia Hartshorne's FLUoxetine. I am also having her maintain her estradiol, multivitamin with minerals, ESTRACE VAGINAL, Pennsaid, medroxyPROGESTERone, atorvastatin, AeroChamber MV, albuterol, Aspirin Low Dose, Wegovy, omeprazole, buPROPion, cyclobenzaprine, and zolpidem.  Meds ordered this encounter  Medications   zolpidem (AMBIEN) 10 MG tablet    Sig: TAKE ONE HALF TO ONE TABLET BY MOUTH EVERY NIGHT AT BEDTIME AS NEEDED FOR SLEEP    Dispense:  15 tablet    Refill:  0    Order Specific Question:   Supervising Provider    Answer:   Penni Homans A [[id number]]

## 2022-07-22 NOTE — Assessment & Plan Note (Addendum)
  BP Readings from Last 3 Encounters:  07/22/22 130/88  07/08/22 130/84  12/16/21 112/70   BP stable without medication.  Monitor.

## 2022-07-26 LAB — DRUG TOX MONITOR 1 W/CONF, ORAL FLD

## 2022-08-01 ENCOUNTER — Encounter: Payer: Self-pay | Admitting: Family

## 2022-08-29 ENCOUNTER — Other Ambulatory Visit: Payer: Self-pay | Admitting: Family

## 2022-08-29 DIAGNOSIS — R059 Cough, unspecified: Secondary | ICD-10-CM

## 2022-09-05 ENCOUNTER — Encounter (INDEPENDENT_AMBULATORY_CARE_PROVIDER_SITE_OTHER): Payer: BC Managed Care – PPO | Admitting: Family

## 2022-09-05 DIAGNOSIS — J329 Chronic sinusitis, unspecified: Secondary | ICD-10-CM

## 2022-09-06 DIAGNOSIS — J329 Chronic sinusitis, unspecified: Secondary | ICD-10-CM | POA: Diagnosis not present

## 2022-09-06 MED ORDER — AMOXICILLIN-POT CLAVULANATE 875-125 MG PO TABS: 1.0000 | ORAL_TABLET | Freq: Two times a day (BID) | ORAL | 0 refills | Status: DC

## 2022-09-06 NOTE — Telephone Encounter (Signed)

## 2022-10-17 ENCOUNTER — Encounter: Payer: Self-pay | Admitting: *Deleted

## 2022-10-26 ENCOUNTER — Encounter: Payer: Self-pay | Admitting: Family Medicine

## 2022-12-01 ENCOUNTER — Other Ambulatory Visit: Payer: Self-pay | Admitting: Family

## 2022-12-01 ENCOUNTER — Other Ambulatory Visit: Payer: Self-pay | Admitting: Cardiology

## 2022-12-01 DIAGNOSIS — R059 Cough, unspecified: Secondary | ICD-10-CM

## 2022-12-28 ENCOUNTER — Encounter: Payer: Self-pay | Admitting: Cardiology

## 2023-01-02 ENCOUNTER — Other Ambulatory Visit (HOSPITAL_COMMUNITY): Payer: Self-pay

## 2023-01-02 ENCOUNTER — Telehealth: Payer: Self-pay

## 2023-01-02 NOTE — Telephone Encounter (Addendum)
Pharmacy Patient Advocate Encounter   Received notification from [facility] that prior authorization for [facility] is needed.    PA submitted on 01/02/23 Key [id number] Status is pending  Haze Rushing, CPhT Pharmacy Patient Advocate Specialist Direct Number: [phone] Fax: [phone]

## 2023-01-02 NOTE — Telephone Encounter (Signed)
PA initiated, please see separate encounter for updates on determination. (I will route you back in once a decision has been made)  Mohamad Bruso, CPhT Pharmacy Patient Advocate Specialist Direct Number: [phone] Fax: [phone]  

## 2023-01-10 NOTE — Telephone Encounter (Signed)
Gave most recent weight and BMI per chart records in January. Plan responded requesting baseline and BMI within the last 3 months and record of the pt's current weight and date weight was taken.     PLEASE ADVISE

## 2023-01-11 NOTE — Telephone Encounter (Signed)
Routed to primary cardiologist/PharmD

## 2023-01-11 NOTE — Telephone Encounter (Signed)
Baseline weight and BMI (12/07/21)  Wt 174 lb 14.4 oz (79.3 kg)   BMI 31.99 kg/m

## 2023-01-12 ENCOUNTER — Other Ambulatory Visit (HOSPITAL_COMMUNITY): Payer: Self-pay

## 2023-01-16 ENCOUNTER — Other Ambulatory Visit (HOSPITAL_COMMUNITY): Payer: Self-pay

## 2023-01-18 ENCOUNTER — Other Ambulatory Visit (HOSPITAL_COMMUNITY): Payer: Self-pay

## 2023-01-19 ENCOUNTER — Other Ambulatory Visit (HOSPITAL_COMMUNITY): Payer: Self-pay

## 2023-01-20 NOTE — Telephone Encounter (Signed)
Pharmacy Patient Advocate Encounter  Received notification from  [facility]  that Prior Authorization for [facility] has been DENIED because PT'S BMI DOES NOT MEET PLAN REQUIREMENTS FOR DRUG.Marland Kitchen

## 2023-01-23 ENCOUNTER — Other Ambulatory Visit (HOSPITAL_BASED_OUTPATIENT_CLINIC_OR_DEPARTMENT_OTHER): Payer: Self-pay

## 2023-01-26 ENCOUNTER — Other Ambulatory Visit (HOSPITAL_BASED_OUTPATIENT_CLINIC_OR_DEPARTMENT_OTHER): Payer: Self-pay

## 2023-02-10 ENCOUNTER — Telehealth: Payer: Self-pay | Admitting: Cardiology

## 2023-02-10 NOTE — Telephone Encounter (Signed)
Riden form [employer] called stating the PA for Haley Fuentes was denied. Riden stated it was denied because they were needing recent notes with the patient's baseline weight. Riden gave a ref number: [id number]. Please advise.

## 2023-02-10 NOTE — Telephone Encounter (Signed)
Call to [facility] using Ref # [id number].  Advised that patient has upcoming appointment for August 23rd for evaluation.  Representative will make a note in chart.  Will need updated paperwork sent once patient is seen

## 2023-02-24 ENCOUNTER — Encounter: Payer: Self-pay | Admitting: Student

## 2023-02-24 ENCOUNTER — Ambulatory Visit: Payer: No Typology Code available for payment source | Attending: Cardiovascular Disease | Admitting: Student

## 2023-02-24 VITALS — BP 149/87 | Ht 62.0 in | Wt 157.0 lb

## 2023-02-24 DIAGNOSIS — E782 Mixed hyperlipidemia: Secondary | ICD-10-CM | POA: Diagnosis not present

## 2023-02-24 DIAGNOSIS — E663 Overweight: Secondary | ICD-10-CM | POA: Diagnosis not present

## 2023-02-24 NOTE — Patient Instructions (Signed)
Will appeal for [facility] denial. Will get updated lipid lab today last LDLc was slightly above goal if updated lab come back with above goal LDLc we will add Repatha or Praluent to your current lipid medications.

## 2023-02-24 NOTE — Assessment & Plan Note (Signed)
Assessment and Plan:  Current weight 157 lbs BMI 28.72. Was on Wegovy 1.7 mg once week till Jan 2024 her BMI was close to [employer] off of the therapy and started gaining appetite, food noise and weight back  PA from insurance denied for [facility]  Will submit an appeal

## 2023-02-24 NOTE — Assessment & Plan Note (Addendum)
Assessment and plan:  Don't have lipid lab on file will check lipid today, given her CAD,Coronary calcium score of 53 (87 precentile), her LDLc goal would be <70 mg/dl  Will initiate Statin if LDLc above goal

## 2023-02-24 NOTE — Progress Notes (Signed)
HPI: Haley Fuentes is a 62 y.o. female patient referred to pharmacy clinic by Dr Servando Salina to re-start weight loss therapy with GLP1-RA. PMH is significant for obesity, CAD, HTN, and HLD. Most recent BMI 26.88 Patient presented today for GLP1 initiation. Her insurance has denied renewal of PA on Wegovy. She was on Wegovy and she had attended BMI close to 25 and went off of the treatment since Jan 2024. She started gaining weight and she would like to go back on Wegovy. Her current weight 157 lbs BMI 28.72.     Diet: eating lot more than before when she was on Wegovy    Exercise: none   Willing to start walking with some chair exercise 30- 45 min every other day   Family History:   Confirmed patient has no personal or family history of medullary thyroid carcinoma (MTC) or Multiple Endocrine Neoplasia syndrome type 2 (MEN 2).   Social History:  Alcohol: 7-9 per week  Smiking: none    Lab Results  Component Value Date   CHOL 167 07/22/2022   HDL 58.10 07/22/2022   LDLCALC 84 07/22/2022   LDLDIRECT 203.0 11/19/2021   TRIG 125.0 07/22/2022   CHOLHDL 3 07/22/2022    Labs: Lab Results  Component Value Date   HGBA1C 5.3 12/07/2021    Wt Readings from Last 1 Encounters:  02/24/23 157 lb (71.2 kg)    BP Readings from Last 1 Encounters:  02/24/23 (!) 149/87   Pulse Readings from Last 1 Encounters:  07/22/22 84       Component Value Date/Time   CHOL 167 07/22/2022 0745   CHOL 264 (H) 01/29/2020 0809   TRIG 125.0 07/22/2022 0745   HDL 58.10 07/22/2022 0745   HDL 60 01/29/2020 0809   CHOLHDL 3 07/22/2022 0745   VLDL 25.0 07/22/2022 0745   LDLCALC 84 07/22/2022 0745   LDLCALC 139 (H) 04/06/2020 0838   LDLDIRECT 203.0 11/19/2021 0830    Past Medical History:  Diagnosis Date   Anemia    hx of iron deficient anemia   Arthritis    Blood transfusion without reported diagnosis    Depression    Gastric polyp    GERD (gastroesophageal reflux disease)     Hemorrhoids    HLD (hyperlipidemia)    Joint pain    left shoulder   Kidney lesion    right   Osteopenia 05/04/2017   Postmenopausal     Current Outpatient Medications on File Prior to Visit  Medication Sig Dispense Refill   ASPIRIN LOW DOSE 81 MG tablet TAKE ONE TABLET BY MOUTH DAILY *SWALLOW WHOLE* 90 tablet 3   atorvastatin (LIPITOR) 40 MG tablet TAKE 1 TABLET BY MOUTH DAILY 90 tablet 0   buPROPion (WELLBUTRIN XL) 300 MG 24 hr tablet TAKE 1 TABLET BY MOUTH DAILY 90 tablet 0   cyclobenzaprine (FLEXERIL) 10 MG tablet TAKE 1 TABLET BY MOUTH TWO TIMES A DAY AS NEEDED FOR MUSCLE SPASMS 180 tablet 0   ESTRACE VAGINAL 0.1 MG/GM vaginal cream Place 1 application vaginally at bedtime.  1   estradiol (ESTRACE) 0.5 MG tablet Take 0.5 mg by mouth daily.     medroxyPROGESTERone (PROVERA) 2.5 MG tablet Take 1 tablet (2.5 mg total) by mouth daily.     Multiple Vitamins-Minerals (MULTIVITAMIN WITH MINERALS) tablet 1 tablet.     omeprazole (PRILOSEC) 40 MG capsule TAKE 1 CAPSULE BY MOUTH DAILY 90 capsule 0   zolpidem (AMBIEN) 10 MG tablet TAKE ONE HALF TO ONE TABLET  BY MOUTH EVERY NIGHT AT BEDTIME AS NEEDED FOR SLEEP 15 tablet 0   ezetimibe (ZETIA) 10 MG tablet Take 10 mg by mouth daily.     No current facility-administered medications on file prior to visit.    Allergies  Allergen Reactions   Hydrocodone-Acetaminophen     "made her skin crawl"   Omni-Pac     vomitting   Voltaren [Diclofenac]     diarrhea    Assessment/Plan   Mixed hyperlipidemia Assessment and plan:  Don't have lipid lab on file will check lipid today, given her CAD,Coronary calcium score of 53 (87 precentile), her LDLc goal would be <70 mg/dl  Will initiate Statin if LDLc above goal   Overweight Assessment and Plan:  Current weight 157 lbs BMI 28.72. Was on Wegovy 1.7 mg once week till Jan 2024 her BMI was close to [employer] off of the therapy and started gaining appetite, food noise and weight back  PA from  insurance denied for [facility]  Will submit an appeal

## 2023-02-25 ENCOUNTER — Other Ambulatory Visit: Payer: Self-pay | Admitting: Family

## 2023-02-25 DIAGNOSIS — R059 Cough, unspecified: Secondary | ICD-10-CM

## 2023-02-25 LAB — LIPID PANEL
Chol/HDL Ratio: 3.3 ratio (ref 0.0–4.4)
Cholesterol, Total: 206 mg/dL — ABNORMAL HIGH (ref 100–199)
HDL: 62 mg/dL (ref >39)
LDL Chol Calc (NIH): 126 mg/dL — ABNORMAL HIGH (ref 0–99)
Triglycerides: 100 mg/dL (ref 0–149)
VLDL Cholesterol Cal: 18 mg/dL (ref 5–40)

## 2023-02-26 ENCOUNTER — Telehealth: Payer: Self-pay | Admitting: Pharmacist

## 2023-02-27 ENCOUNTER — Telehealth: Payer: Self-pay

## 2023-02-27 ENCOUNTER — Other Ambulatory Visit (HOSPITAL_COMMUNITY): Payer: Self-pay

## 2023-02-27 NOTE — Telephone Encounter (Signed)
   Baseline chartnotes of the pts BMI has been submitted/faxed to the pts plan.

## 2023-03-03 ENCOUNTER — Other Ambulatory Visit: Payer: Self-pay | Admitting: Family

## 2023-03-08 ENCOUNTER — Other Ambulatory Visit (HOSPITAL_COMMUNITY): Payer: Self-pay

## 2023-03-08 NOTE — Telephone Encounter (Signed)
Update:     I have called the pts plan and done a follow up. Per the plan , they have a total of 15 calendar days before making a decision on approving or denying the patients Rx for wegovy.   Contact# [phone]

## 2023-03-09 NOTE — Telephone Encounter (Signed)
See other encounter for more detail

## 2023-03-13 NOTE — Telephone Encounter (Signed)
Received fax - Appeal denied  Discussed with patient.

## 2023-03-14 ENCOUNTER — Encounter: Payer: Self-pay | Admitting: Cardiology

## 2023-03-15 ENCOUNTER — Ambulatory Visit: Payer: No Typology Code available for payment source | Attending: Cardiology | Admitting: Cardiology

## 2023-03-15 ENCOUNTER — Other Ambulatory Visit: Payer: Self-pay

## 2023-03-15 ENCOUNTER — Encounter: Payer: Self-pay | Admitting: Cardiology

## 2023-03-15 VITALS — BP 128/84 | HR 78 | Ht 61.0 in | Wt 157.2 lb

## 2023-03-15 DIAGNOSIS — R5383 Other fatigue: Secondary | ICD-10-CM

## 2023-03-15 DIAGNOSIS — E782 Mixed hyperlipidemia: Secondary | ICD-10-CM

## 2023-03-15 DIAGNOSIS — I2510 Atherosclerotic heart disease of native coronary artery without angina pectoris: Secondary | ICD-10-CM

## 2023-03-15 DIAGNOSIS — G4733 Obstructive sleep apnea (adult) (pediatric): Secondary | ICD-10-CM

## 2023-03-15 DIAGNOSIS — Z79899 Other long term (current) drug therapy: Secondary | ICD-10-CM | POA: Diagnosis not present

## 2023-03-15 MED ORDER — ISOSORBIDE MONONITRATE ER 30 MG PO TB24: 30.0000 mg | ORAL_TABLET | Freq: Every day | ORAL | 3 refills | Status: DC

## 2023-03-15 MED ORDER — ATORVASTATIN CALCIUM 80 MG PO TABS: 80.0000 mg | ORAL_TABLET | Freq: Every day | ORAL | 3 refills | Status: AC

## 2023-03-15 NOTE — Patient Instructions (Signed)
Medication Instructions:  Your physician has recommended you make the following change in your medication - Start isosorbide mononitrate (IMDUR) 30mg , once daily     *If you need a refill on your cardiac medications before your next appointment, please call your pharmacy*   Lab Work: Dr. Barnetta Hammersmith wants these labs drawn today:  -CMET -Mag -TSH+FT4/FT3  Your physician recommends that you return for lab work in:12 weeks to have your LIPID PANEL drawn.     If you have labs (blood work) drawn today and your tests are completely normal, you will receive your results only by: MyChart Message (if you have MyChart) OR A paper copy in the mail If you have any lab test that is abnormal or we need to change your treatment, we will call you to review the results.     Follow-Up: At [facility], you and your health needs are our priority.  As part of our continuing mission to provide you with exceptional heart care, we have created designated Provider Care Teams.  These Care Teams include your primary Cardiologist (physician) and Advanced Practice Providers (APPs -  Physician Assistants and Nurse Practitioners) who all work together to provide you with the care you need, when you need it.  Your next appointment:   6 month(s)  The format for your next appointment:   In Person  Provider:   Thomasene Ripple, DO

## 2023-03-15 NOTE — Progress Notes (Signed)
Cardiology Office Note:    Date:  03/15/2023   ID:  Haley Fuentes, DOB Mar 12, 1961, MRN [id number]  PCP:  Sandford Craze, NP  Cardiologist:  Thomasene Ripple, DO  Electrophysiologist:  None   Referring MD: Sandford Craze, NP    History of Present Illness:    Haley Fuentes is a 62 y.o. female with a hx of hyperlipidemia, nonobstructive CAD seen on coronary CTA, obesity and sleep apnea here today for follow-up visit.  At her last visit with me she restarted the Ranexa.  We took it off her list.  She has been seen in our lipid clinic and since her last visit she had some blood work done recently I increase her Lipitor to 80 mg this morning and continue her Zetia.  Explained to the patient that her goal will follow her LDL is less than 55.  She tells me today that she is experiencing intermittent chest discomfort.  Midsternal comes and go.  On exertion.  Nothing makes it better or more hours.  She also notes fatigue.  Past Medical History:  Diagnosis Date   Anemia    hx of iron deficient anemia   Arthritis    Blood transfusion without reported diagnosis    Depression    Gastric polyp    GERD (gastroesophageal reflux disease)    Hemorrhoids    HLD (hyperlipidemia)    Joint pain    left shoulder   Kidney lesion    right   Osteopenia 05/04/2017   Postmenopausal     Past Surgical History:  Procedure Laterality Date   ENDOMETRIAL ABLATION  2007   Novasure   TUBAL LIGATION  1983    Current Medications: Current Meds  Medication Sig   ASPIRIN LOW DOSE 81 MG tablet TAKE ONE TABLET BY MOUTH DAILY *SWALLOW WHOLE*   atorvastatin (LIPITOR) 80 MG tablet Take 1 tablet (80 mg total) by mouth daily.   buPROPion (WELLBUTRIN XL) 300 MG 24 hr tablet TAKE 1 TABLET BY MOUTH DAILY   cyclobenzaprine (FLEXERIL) 10 MG tablet TAKE 1 TABLET BY MOUTH TWO TIMES A DAY AS NEEDED FOR MUSCLE SPASMS   ESTRACE VAGINAL 0.1 MG/GM vaginal cream Place 1 application vaginally at bedtime.   estradiol  (ESTRACE) 0.5 MG tablet Take 0.5 mg by mouth daily.   ezetimibe (ZETIA) 10 MG tablet Take 10 mg by mouth daily.   isosorbide mononitrate (IMDUR) 30 MG 24 hr tablet Take 1 tablet (30 mg total) by mouth daily.   medroxyPROGESTERone (PROVERA) 2.5 MG tablet Take 1 tablet (2.5 mg total) by mouth daily.   Multiple Vitamins-Minerals (MULTIVITAMIN WITH MINERALS) tablet 1 tablet.   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    Employer: [employer]  Tobacco Use   Smoking status: Former    Current packs/day: 0.00    Average packs/day: 0.3 packs/day for 4.0 years (1.0 ttl pk-yrs)    Types: Cigarettes    Start date: 07/04/1981    Quit date: 07/04/1985  Years since quitting: 37.7   Smokeless tobacco: Never  Vaping Use   Vaping status: Never Used  Substance and Sexual Activity   Alcohol use: Yes    Alcohol/week: 0.0 standard drinks of alcohol    Comment: 4-5 times a week   Drug use: No   Sexual activity: Not on file  Other Topics Concern   Not on file  Social History Narrative   Regular exercise: 30 minutes 5 days a week   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      Social Determinants of Health   Financial Resource Strain: Not on file  Food Insecurity: Not on file  Transportation Needs: Not on file  Physical Activity: Not on file  Stress: Not on file  Social Connections: Not  on file     Family History: The patient's family history includes Alcohol abuse in her brother; Anxiety disorder in her son; Breast cancer (age of onset: 81) in her mother; Depression in her son; Diabetes in her father and mother; Healthy in her daughter and son; Heart disease in her father and mother; Hepatitis in her brother; Raynaud syndrome in her sister; Stroke in her father.  ROS:   Review of Systems  Constitution: Negative for decreased appetite, fever and weight gain.  HENT: Negative for congestion, ear discharge, hoarse voice and sore throat.   Eyes: Negative for discharge, redness, vision loss in right eye and visual halos.  Cardiovascular: Negative for chest pain, dyspnea on exertion, leg swelling, orthopnea and palpitations.  Respiratory: Negative for cough, hemoptysis, shortness of breath and snoring.   Endocrine: Negative for heat intolerance and polyphagia.  Hematologic/Lymphatic: Negative for bleeding problem. Does not bruise/bleed easily.  Skin: Negative for flushing, nail changes, rash and suspicious lesions.  Musculoskeletal: Negative for arthritis, joint pain, muscle cramps, myalgias, neck pain and stiffness.  Gastrointestinal: Negative for abdominal pain, bowel incontinence, diarrhea and excessive appetite.  Genitourinary: Negative for decreased libido, genital sores and incomplete emptying.  Neurological: Negative for brief paralysis, focal weakness, headaches and loss of balance.  Psychiatric/Behavioral: Negative for altered mental status, depression and suicidal ideas.  Allergic/Immunologic: Negative for HIV exposure and persistent infections.    EKGs/Labs/Other Studies Reviewed:    The following studies were reviewed today:   EKG: None today   TTE6/10/2019 Aorta: Normal size. Scattered calcifications in the ascending and descending aorta. No dissection.   Aortic Valve:  Trileaflet.  No calcifications.   Coronary Arteries:  Normal coronary origin.  Right  dominance.   RCA is a large dominant artery that gives rise to PDA and PLVB. There is no plaque.   Left main is a large artery that gives rise to LAD, Ramus and LCX arteries. There is mild calcified plaque in the distal LM with associated stenosis of 25-49%.   LAD is a large vessel that gives risk to a large diagonal. There is mild noncalcified plaque in the mid LAD with associated stenosis of 25-49%.   The ramus is a small sized vessel with no plaque.   LCX is a non-dominant artery that gives rise to one large OM1 branch. There is no plaque.   Other findings:   Normal pulmonary vein drainage into the left atrium.   Normal let atrial appendage without a thrombus.   Normal size of the pulmonary artery.   IMPRESSION: 1. Coronary calcium score  of 69. This was 87th percentile for age and sex matched control.   2.  Normal coronary origin with right dominance.   3.  Mild atherosclerosis of the distal LM and mid LAD.  CAD-RADS=1.   4.  Consider non-atherosclerotic causes of chest pain.   5.  Recommend preventive therapy and risk factor modification.   6.  This study has been submitted for FFR analysis.   Traci Turner    Recent Labs: 07/22/2022: BUN 13; Creatinine, Ser 0.71; Potassium 4.3; Sodium 142  Recent Lipid Panel    Component Value Date/Time   CHOL 206 (H) 02/24/2023 0935   TRIG 100 02/24/2023 0935   HDL 62 02/24/2023 0935   CHOLHDL 3.3 02/24/2023 0935   CHOLHDL 3 07/22/2022 0745   VLDL 25.0 07/22/2022 0745   LDLCALC 126 (H) 02/24/2023 0935   LDLCALC 139 (H) 04/06/2020 0838   LDLDIRECT 203.0 11/19/2021 0830    Physical Exam:    VS:  BP 128/84 (BP Location: Right Arm, Patient Position: Sitting, Cuff Size: Normal)   Pulse 78   Ht 5\' 3"  (1.6 m)   Wt 157 lb 3.2 oz (71.3 kg)   LMP 07/04/2004   SpO2 97%   BMI 27.85 kg/m     Wt Readings from Last 3 Encounters:  03/15/23 157 lb 3.2 oz (71.3 kg)  02/24/23 157 lb (71.2 kg)  07/22/22 147 lb (66.7 kg)      GEN: Well nourished, well developed in no acute distress HEENT: Normal NECK: No JVD; No carotid bruits LYMPHATICS: No lymphadenopathy CARDIAC: S1S2 noted,RRR, no murmurs, rubs, gallops RESPIRATORY:  Clear to auscultation without rales, wheezing or rhonchi  ABDOMEN: Soft, non-tender, non-distended, +bowel sounds, no guarding. EXTREMITIES: No edema, No cyanosis, no clubbing MUSCULOSKELETAL:  No deformity  SKIN: Warm and dry NEUROLOGIC:  Alert and oriented x 3, non-focal PSYCHIATRIC:  Normal affect, good insight  ASSESSMENT:    1. Coronary artery disease involving native coronary artery of native heart without angina pectoris   2. Fatigue, unspecified type   3. Medication management   4. Mixed hyperlipidemia   5. OSA (obstructive sleep apnea)    PLAN:    She has known coronary artery disease.  With the symptoms I am concerned therefrom going to start the patient on Imdur 30 mg daily for antianginal.  I am hoping that this will help.  In addition continue her aspirin and her statin. I just increase her Lipitor this morning to 80 mg daily.  Zetia 10 mg daily.  LDL goal is less than 55.  Will get blood work in 12 weeks.  She does not be met will consider use of PCSK9 inhibitors in this patient.  She is not using her CPAP.  I have asked the patient that it would be beneficial to use her CPAP to treat her sleep apnea as this could help with her fatigue.  In addition we will get blood work for TSH. Will get CMP as well as mag.  The patient is in agreement with the above plan. The patient left the office in stable condition.  The patient will follow up in   Medication Adjustments/Labs and Tests Ordered: Current medicines are reviewed at length with the patient today.  Concerns regarding medicines are outlined above.  Orders Placed This Encounter  Procedures   Lipid panel   TSH+T4F+T3Free   Magnesium   Comprehensive Metabolic Panel (CMET)   EKG 12-Lead   Meds ordered this encounter   Medications   isosorbide mononitrate (  IMDUR) 30 MG 24 hr tablet    Sig: Take 1 tablet (30 mg total) by mouth daily.    Dispense:  90 tablet    Refill:  3    Patient Instructions  Medication Instructions:  Your physician has recommended you make the following change in your medication - Start isosorbide mononitrate (IMDUR) 30mg , once daily     *If you need a refill on your cardiac medications before your next appointment, please call your pharmacy*   Lab Work: Dr. Barnetta Hammersmith wants these labs drawn today:  -CMET -Mag -TSH+FT4/FT3  Your physician recommends that you return for lab work in:12 weeks to have your LIPID PANEL drawn.     If you have labs (blood work) drawn today and your tests are completely normal, you will receive your results only by: MyChart Message (if you have MyChart) OR A paper copy in the mail If you have any lab test that is abnormal or we need to change your treatment, we will call you to review the results.     Follow-Up: At [facility], you and your health needs are our priority.  As part of our continuing mission to provide you with exceptional heart care, we have created designated Provider Care Teams.  These Care Teams include your primary Cardiologist (physician) and Advanced Practice Providers (APPs -  Physician Assistants and Nurse Practitioners) who all work together to provide you with the care you need, when you need it.  Your next appointment:   6 month(s)  The format for your next appointment:   In Person  Provider:   Thomasene Ripple, DO      Adopting a Healthy Lifestyle.  Know what a healthy weight is for you (roughly BMI <25) and aim to maintain this   Aim for 7+ servings of fruits and vegetables daily   65-80+ fluid ounces of water or unsweet tea for healthy kidneys   Limit to max 1 drink of alcohol per day; avoid smoking/tobacco   Limit animal fats in diet for cholesterol and heart health - choose grass fed whenever  available   Avoid highly processed foods, and foods high in saturated/trans fats   Aim for low stress - take time to unwind and care for your mental health   Aim for 150 min of moderate intensity exercise weekly for heart health, and weights twice weekly for bone health   Aim for 7-9 hours of sleep daily   When it comes to diets, agreement about the perfect plan isnt easy to find, even among the experts. Experts at the [facility] [employer] developed an idea known as the Healthy Eating Plate. Just imagine a plate divided into logical, healthy portions.   The emphasis is on diet quality:   Load up on vegetables and fruits - one-half of your plate: Aim for color and variety, and remember that potatoes dont count.   Go for whole grains - one-quarter of your plate: Whole wheat, barley, wheat berries, quinoa, oats, brown rice, and foods made with them. If you want pasta, go with whole wheat pasta.   Protein power - one-quarter of your plate: Fish, chicken, beans, and nuts are all healthy, versatile protein sources. Limit red meat.   The diet, however, does go beyond the plate, offering a few other suggestions.   Use healthy plant oils, such as olive, canola, soy, corn, sunflower and peanut. Check the labels, and avoid partially hydrogenated oil, which have unhealthy trans fats.   If youre  thirsty, drink water. Coffee and tea are good in moderation, but skip sugary drinks and limit milk and dairy products to one or two daily servings.   The type of carbohydrate in the diet is more important than the amount. Some sources of carbohydrates, such as vegetables, fruits, whole grains, and beans-are healthier than others.   Finally, stay active  Signed, Thomasene Ripple, DO  03/15/2023 3:20 PM    [facility]

## 2023-03-15 NOTE — Telephone Encounter (Signed)
Pt in office today

## 2023-03-16 LAB — COMPREHENSIVE METABOLIC PANEL
ALT: 26 IU/L (ref 0–32)
AST: 19 IU/L (ref 0–40)
Albumin: 4.6 g/dL (ref 3.9–4.9)
Alkaline Phosphatase: 88 IU/L (ref 44–121)
BUN/Creatinine Ratio: 13 (ref 12–28)
BUN: 15 mg/dL (ref 8–27)
Bilirubin Total: 0.7 mg/dL (ref 0.0–1.2)
CO2: 23 mmol/L (ref 20–29)
Calcium: 9.8 mg/dL (ref 8.7–10.3)
Chloride: 104 mmol/L (ref 96–106)
Creatinine, Ser: 1.17 mg/dL — ABNORMAL HIGH (ref 0.57–1.00)
Globulin, Total: 2.5 g/dL (ref 1.5–4.5)
Glucose: 93 mg/dL (ref 70–99)
Potassium: 4.5 mmol/L (ref 3.5–5.2)
Sodium: 142 mmol/L (ref 134–144)
Total Protein: 7.1 g/dL (ref 6.0–8.5)
eGFR: 53 mL/min/{1.73_m2} — ABNORMAL LOW (ref >59)

## 2023-03-16 LAB — MAGNESIUM: Magnesium: 2 mg/dL (ref 1.6–2.3)

## 2023-03-16 LAB — TSH+T4F+T3FREE
Free T4: 1.24 ng/dL (ref 0.82–1.77)
T3, Free: 2.5 pg/mL (ref 2.0–4.4)
TSH: 1.73 u[IU]/mL (ref 0.450–4.500)

## 2023-03-17 ENCOUNTER — Other Ambulatory Visit: Payer: Self-pay | Admitting: Cardiology

## 2023-03-23 ENCOUNTER — Encounter: Payer: Self-pay | Admitting: Family

## 2023-03-23 DIAGNOSIS — N289 Disorder of kidney and ureter, unspecified: Secondary | ICD-10-CM

## 2023-03-28 ENCOUNTER — Telehealth: Payer: Self-pay | Admitting: Pharmacist

## 2023-03-28 ENCOUNTER — Other Ambulatory Visit (HOSPITAL_BASED_OUTPATIENT_CLINIC_OR_DEPARTMENT_OTHER): Payer: Self-pay

## 2023-03-28 MED ORDER — WEGOVY 0.5 MG/0.5ML ~~LOC~~ SOAJ
0.5000 mg | SUBCUTANEOUS | 0 refills | Status: DC
  Filled 2023-03-28 – 2023-05-05 (×2): qty 2, 28d supply, fill #0

## 2023-03-28 MED ORDER — WEGOVY 0.25 MG/0.5ML ~~LOC~~ SOAJ
0.2500 mg | SUBCUTANEOUS | 0 refills | Status: DC
  Filled 2023-03-28: qty 2, 28d supply, fill #0

## 2023-03-28 NOTE — Telephone Encounter (Signed)
Confirmed with insurance, appeal for Wegovy that was faxed on 09/09 was approved.  Patient made aware. Patient is very appreciative of our efforts to get this approved.  Will send Wegovy 0.25 mg once week and 0.5 mg once week prescriptions to [facility] [place].  F/u via phone due in 7-8 weeks.

## 2023-03-29 ENCOUNTER — Encounter: Payer: Self-pay | Admitting: Cardiology

## 2023-03-29 DIAGNOSIS — Z79899 Other long term (current) drug therapy: Secondary | ICD-10-CM

## 2023-03-30 ENCOUNTER — Other Ambulatory Visit: Payer: Self-pay

## 2023-03-30 NOTE — Progress Notes (Signed)
Medication list updated. Imdur removed.

## 2023-03-30 NOTE — Telephone Encounter (Signed)
Medication list updated. Imdur removed.

## 2023-04-03 ENCOUNTER — Other Ambulatory Visit (HOSPITAL_BASED_OUTPATIENT_CLINIC_OR_DEPARTMENT_OTHER): Payer: Self-pay

## 2023-04-05 ENCOUNTER — Other Ambulatory Visit (HOSPITAL_BASED_OUTPATIENT_CLINIC_OR_DEPARTMENT_OTHER): Payer: Self-pay

## 2023-04-06 MED ORDER — RANOLAZINE ER 500 MG PO TB12: 500.0000 mg | ORAL_TABLET | Freq: Two times a day (BID) | ORAL | 3 refills | Status: DC

## 2023-05-02 ENCOUNTER — Other Ambulatory Visit (HOSPITAL_BASED_OUTPATIENT_CLINIC_OR_DEPARTMENT_OTHER): Payer: Self-pay

## 2023-05-05 ENCOUNTER — Other Ambulatory Visit (HOSPITAL_BASED_OUTPATIENT_CLINIC_OR_DEPARTMENT_OTHER): Payer: Self-pay

## 2023-05-08 ENCOUNTER — Other Ambulatory Visit: Payer: Self-pay | Admitting: Cardiology

## 2023-05-22 ENCOUNTER — Other Ambulatory Visit (HOSPITAL_BASED_OUTPATIENT_CLINIC_OR_DEPARTMENT_OTHER): Payer: Self-pay

## 2023-05-22 ENCOUNTER — Telehealth: Payer: Self-pay | Admitting: Pharmacist

## 2023-05-22 MED ORDER — WEGOVY 1 MG/0.5ML ~~LOC~~ SOAJ
1.0000 mg | SUBCUTANEOUS | 0 refills | Status: DC
  Filled 2023-05-22: qty 2, 28d supply, fill #0

## 2023-05-22 NOTE — Telephone Encounter (Signed)
Spoke to patient, tolerates 0.5 mg Wegovy dose well. Ready to move on to next dose. Prescription for Wegovy 1 mg sent to [facility] in [place].

## 2023-05-25 ENCOUNTER — Other Ambulatory Visit: Payer: Self-pay | Admitting: Family

## 2023-05-25 DIAGNOSIS — R059 Cough, unspecified: Secondary | ICD-10-CM

## 2023-06-05 ENCOUNTER — Other Ambulatory Visit (HOSPITAL_BASED_OUTPATIENT_CLINIC_OR_DEPARTMENT_OTHER): Payer: Self-pay

## 2023-06-05 ENCOUNTER — Ambulatory Visit (HOSPITAL_BASED_OUTPATIENT_CLINIC_OR_DEPARTMENT_OTHER)
Admission: RE | Admit: 2023-06-05 | Discharge: 2023-06-05 | Disposition: A | Discharge status: Home / Self Care | Payer: No Typology Code available for payment source | Source: Ambulatory Visit | Attending: Family Medicine | Admitting: Family Medicine

## 2023-06-05 ENCOUNTER — Ambulatory Visit: Payer: No Typology Code available for payment source | Admitting: Family Medicine

## 2023-06-05 VITALS — BP 150/94 | Ht 63.0 in | Wt 150.0 lb

## 2023-06-05 DIAGNOSIS — M79641 Pain in right hand: Secondary | ICD-10-CM

## 2023-06-05 MED ORDER — CELECOXIB 100 MG PO CAPS
100.0000 mg | ORAL_CAPSULE | Freq: Two times a day (BID) | ORAL | 0 refills | Status: AC
Start: 2023-06-05 — End: 2023-06-26
  Filled 2023-06-05: qty 42, 21d supply, fill #0

## 2023-06-05 NOTE — Progress Notes (Signed)
CHIEF COMPLAINT: No chief complaint on file.  _____________________________________________________________ SUBJECTIVE  HPI  Pt is a 62 y.o. female here for evaluation of R hand pain ongoing for roughly 1 month During yardwork at the beginning of November she strained her ulnar side forearm. No FOOSH mechanism. Is having some difficulty forming a fist Has been wearing a splint on her pinky finger which has been helping at night Tried to get onto a ride and jammed it  Friday night (3 days ago) Therapies tried: Ibuprofen helping but doesn't want to take every day No numbness/tingling  Treated for a trigger finger in January 2024  ------------------------------------------------------------------------------------------------------ Past Medical History:  Diagnosis Date   Anemia    hx of iron deficient anemia   Arthritis    Blood transfusion without reported diagnosis    Depression    Gastric polyp    GERD (gastroesophageal reflux disease)    Hemorrhoids    HLD (hyperlipidemia)    Joint pain    left shoulder   Kidney lesion    right   Osteopenia 05/04/2017   Postmenopausal     Past Surgical History:  Procedure Laterality Date   ENDOMETRIAL ABLATION  2007   Novasure   TUBAL LIGATION  1983      Outpatient Encounter Medications as of 06/05/2023  Medication Sig   ASPIRIN LOW DOSE 81 MG tablet TAKE 1 TABLET BY MOUTH DAILY ** SWALLOW TABLET WHOLE**   atorvastatin (LIPITOR) 80 MG tablet Take 1 tablet (80 mg total) by mouth daily.   buPROPion (WELLBUTRIN XL) 300 MG 24 hr tablet Take 1 tablet (300 mg total) by mouth daily.   cyclobenzaprine (FLEXERIL) 10 MG tablet TAKE 1 TABLET BY MOUTH TWO TIMES A DAY AS NEEDED FOR MUSCLE SPASMS   ESTRACE VAGINAL 0.1 MG/GM vaginal cream Place 1 application vaginally at bedtime.   estradiol (ESTRACE) 0.5 MG tablet Take 0.5 mg by mouth daily.   ezetimibe (ZETIA) 10 MG tablet TAKE 1 TABLET BY MOUTH DAILY   medroxyPROGESTERone (PROVERA) 2.5 MG  tablet Take 1 tablet (2.5 mg total) by mouth daily.   Multiple Vitamins-Minerals (MULTIVITAMIN WITH MINERALS) tablet 1 tablet.   omeprazole (PRILOSEC) 40 MG capsule Take 1 capsule (40 mg total) by mouth daily.   ranolazine (RANEXA) 500 MG 12 hr tablet Take 1 tablet (500 mg total) by mouth 2 (two) times daily.   Semaglutide-Weight Management (WEGOVY) 1 MG/0.5ML SOAJ Inject 1 mg into the skin once a week.   zolpidem (AMBIEN) 10 MG tablet TAKE ONE HALF TO ONE TABLET BY MOUTH EVERY NIGHT AT BEDTIME AS NEEDED FOR SLEEP   No facility-administered encounter medications on file as of 06/05/2023.   ------------------------------------------------------------------------------------------------------  _____________________________________________________________ OBJECTIVE  PHYSICAL EXAM  Today's Vitals   06/05/23 1459  BP: (!) 150/94  Weight: 150 lb (68 kg)  Height: 5\' 3"  (1.6 m)   Body mass index is 26.57 kg/m.   reviewed  General: A+Ox3, no acute distress, well-nourished, appropriate affect CV: pulses 2+ regular, nondiaphoretic, no peripheral edema, cap refill <2sec Lungs: no audible wheezing, non-labored breathing, bilateral chest rise/fall, nontachypneic Skin: warm, well-perfused, non-icteric, no susp lesions or rashes Neuro: no focal deficits. Sensation intact, muscle tone wnl, no atrophy Psych: no signs of depression or anxiety MSK:   R hand:  Radial pulse intact. No gross deformities or significant swelling noted. Unable to form fist with 5th MC d/t pain. Pain with active finger abduction. No palpable effusion. Distal ulna/ulnocarpal joint TTP, distal ulnar sided 5th metacarpal/ulnar sided MCPJ TTP.  5th MCPJ slightly enlarged compared to L hand. Reproduced pain MCPJ with passive flexion of MCPJ, PIP, and DIP. Weakness d/t pain with resisted finger extension, flexion, abduction, adduction.   _____________________________________________________________ ASSESSMENT/PLAN Diagnoses and  all orders for this visit:  Right hand pain -     DG Hand Complete Right; Future -     celecoxib (CELEBREX) 100 MG capsule; Take 1 capsule (100 mg total) by mouth 2 (two) times daily for 21 days.  Unclear etiology of acute hand pain, differential includes tenosynovitis, OA pain, ligamentous injury, concomitant DRUJ injury, among others. Would be uncommon location for gout, no effusion noted. Hand XR ordered, additional management pending results. Medications reviewed, documented diarrhea with voltaren. Discussed options for pain management, risks/benefits reviewed, Tashelle will trial celebrex and plan to discontinue and notify if diarrhea/side effects occur to discuss alternate therapies for pain management.  F/u 3 weeks, sooner if needed. All questions answered. Return precautions discussed. Patient verbalized understanding and is in agreement with plan  Electronically signed by: Burna Forts, MD 06/05/2023 3:27 PM

## 2023-06-13 ENCOUNTER — Encounter: Payer: Self-pay | Admitting: Family Medicine

## 2023-06-15 ENCOUNTER — Telehealth: Payer: Self-pay | Admitting: Pharmacist

## 2023-06-15 NOTE — Telephone Encounter (Signed)
Due for Wegovy dose titration. LVM, MyChart sent

## 2023-06-21 ENCOUNTER — Other Ambulatory Visit (HOSPITAL_BASED_OUTPATIENT_CLINIC_OR_DEPARTMENT_OTHER): Payer: Self-pay

## 2023-06-21 MED ORDER — WEGOVY 1 MG/0.5ML ~~LOC~~ SOAJ
1.0000 mg | SUBCUTANEOUS | 0 refills | Status: DC
  Filled 2023-06-21 – 2023-07-06 (×2): qty 2, 28d supply, fill #0

## 2023-06-21 NOTE — Addendum Note (Signed)
Addended by: Tylene Fantasia on: 06/21/2023 10:49 AM   Modules accepted: Orders

## 2023-07-03 ENCOUNTER — Other Ambulatory Visit (HOSPITAL_BASED_OUTPATIENT_CLINIC_OR_DEPARTMENT_OTHER): Payer: Self-pay

## 2023-07-06 ENCOUNTER — Other Ambulatory Visit (HOSPITAL_BASED_OUTPATIENT_CLINIC_OR_DEPARTMENT_OTHER): Payer: Self-pay

## 2023-07-13 LAB — HM MAMMOGRAPHY

## 2023-07-17 ENCOUNTER — Ambulatory Visit (INDEPENDENT_AMBULATORY_CARE_PROVIDER_SITE_OTHER): Payer: No Typology Code available for payment source | Admitting: Family Medicine

## 2023-07-17 ENCOUNTER — Other Ambulatory Visit (HOSPITAL_BASED_OUTPATIENT_CLINIC_OR_DEPARTMENT_OTHER): Payer: Self-pay

## 2023-07-17 ENCOUNTER — Encounter: Payer: Self-pay | Admitting: Family Medicine

## 2023-07-17 VITALS — BP 118/86 | Ht 63.0 in | Wt 150.0 lb

## 2023-07-17 DIAGNOSIS — M19041 Primary osteoarthritis, right hand: Secondary | ICD-10-CM | POA: Diagnosis not present

## 2023-07-17 MED ORDER — METHYLPREDNISOLONE 4 MG PO TBPK
ORAL_TABLET | ORAL | 0 refills | Status: DC
  Filled 2023-07-17: qty 21, 6d supply, fill #0

## 2023-07-17 NOTE — Progress Notes (Signed)
 CHIEF COMPLAINT: No chief complaint on file.  _____________________________________________________________ SUBJECTIVE  HPI  Pt is a 63 y.o. female here for f/u R hand pain  Last seen 06/05/2023 for right hand pain Hand x-ray at that time demonstrated signs of mild to moderate osteoarthritis primarily DIPJ 2-5 with osteophytic changes, triscaphe joint space narrowing, second CMC joint space narrowing, thumb IPJ joint space narrowing with osteophytic changes Has still been using splint Celebrex  x 2 weeks, pain was manageable since christmas time, but gradually intensity came back in the last week Pain is constantly 7/10, however today is at a 10/10 Wrapped it with a lidocaine  patch which seemed to help it, wearing splint still Primarily located lateral finger/5th finger Radiating up her elbow Numbness/tingling: none Exacerbated by clenching and touching  Therapies tried: lidocaine  patch, splinting, celebrex  with some benefit  ------------------------------------------------------------------------------------------------------ Past Medical History:  Diagnosis Date   Anemia    hx of iron deficient anemia   Arthritis    Blood transfusion without reported diagnosis    Depression    Gastric polyp    GERD (gastroesophageal reflux disease)    Hemorrhoids    HLD (hyperlipidemia)    Joint pain    left shoulder   Kidney lesion    right   Osteopenia 05/04/2017   Postmenopausal     Past Surgical History:  Procedure Laterality Date   ENDOMETRIAL ABLATION  2007   Novasure   TUBAL LIGATION  1983      Outpatient Encounter Medications as of 07/17/2023  Medication Sig   ASPIRIN  LOW DOSE 81 MG tablet TAKE 1 TABLET BY MOUTH DAILY ** SWALLOW TABLET WHOLE**   atorvastatin  (LIPITOR) 80 MG tablet Take 1 tablet (80 mg total) by mouth daily.   buPROPion  (WELLBUTRIN  XL) 300 MG 24 hr tablet Take 1 tablet (300 mg total) by mouth daily.   cyclobenzaprine  (FLEXERIL ) 10 MG tablet TAKE 1 TABLET BY  MOUTH TWO TIMES A DAY AS NEEDED FOR MUSCLE SPASMS   ESTRACE VAGINAL 0.1 MG/GM vaginal cream Place 1 application vaginally at bedtime.   estradiol (ESTRACE) 0.5 MG tablet Take 0.5 mg by mouth daily.   ezetimibe  (ZETIA ) 10 MG tablet TAKE 1 TABLET BY MOUTH DAILY   medroxyPROGESTERone  (PROVERA ) 2.5 MG tablet Take 1 tablet (2.5 mg total) by mouth daily.   Multiple Vitamins-Minerals (MULTIVITAMIN WITH MINERALS) tablet 1 tablet.   omeprazole  (PRILOSEC) 40 MG capsule Take 1 capsule (40 mg total) by mouth daily.   ranolazine  (RANEXA ) 500 MG 12 hr tablet Take 1 tablet (500 mg total) by mouth 2 (two) times daily.   Semaglutide -Weight Management (WEGOVY ) 1 MG/0.5ML SOAJ Inject 1 mg into the skin once a week.   zolpidem  (AMBIEN ) 10 MG tablet TAKE ONE HALF TO ONE TABLET BY MOUTH EVERY NIGHT AT BEDTIME AS NEEDED FOR SLEEP   No facility-administered encounter medications on file as of 07/17/2023.    ------------------------------------------------------------------------------------------------------  _____________________________________________________________ OBJECTIVE  PHYSICAL EXAM  Today's Vitals   07/17/23 1301  BP: 118/86  Weight: 150 lb (68 kg)  Height: 5' 3 (1.6 m)  PainSc: 10-Worst pain ever  PainLoc: Finger   Body mass index is 26.57 kg/m.   reviewed  General: A+Ox3, no acute distress, well-nourished, appropriate affect CV: pulses 2+ regular, nondiaphoretic, no peripheral edema, cap refill <2sec Lungs: no audible wheezing, non-labored breathing, bilateral chest rise/fall, nontachypneic Skin: warm, well-perfused, non-icteric, no susp lesions or rashes Neuro: Sensation intact, muscle tone wnl, no atrophy Psych: no signs of depression or anxiety MSK:  HAND/WRIST: R wrist clean/dry/intact skin. Full ROM without pain. no soft tissue swelling.  no tenderness along 1st extensor compartment.  -Finkelstein's. + L lateral PIPJ bony tenderness.  no snuff box tenderness. grip strength  limited by lateral 5th PIPJ pain. no tenderness over carpals, metacarpals.  R wrist with FROM without pain, weakness, instability. Limited POCUS demonstrating irregular cortical silhouette at 5th PIP joint with trace surrounding hypoechogenic changes suggestive of arthritic changes, confirmed with contralateral 5th PIPJ without cortical irregularities or soft tissue hypoechogenic changes _____________________________________________________________ ASSESSMENT/PLAN Diagnoses and all orders for this visit:  Osteoarthritis of right hand, unspecified osteoarthritis type -     Ambulatory referral to Occupational Therapy  Other orders -     methylPREDNISolone  (MEDROL  DOSEPAK) 4 MG TBPK tablet; Take as instructed in the kit   POCUS and XR findings reviewed with patient today, options discussed for management. Given chronicity of pain, MRI hand offered to further evaluate for concomitant pathology, which was deferred today by patient request. Patient will plan to utilize pennsaid  which she has at home, continue with RICE. Additional otc therapies discussed. Rx medrol  dosepak for ongoing severe pain. Anticipate f/u in 2 weeks PRN, sooner as needed. May consider intraarticular injection, revisit advanced imaging at that time. All questions answered. Return precautions discussed. Patient verbalized understanding and is in agreement with plan  Electronically signed by: Bricelyn Freestone W Kashton Mcartor, MD 07/17/2023 12:20 PM

## 2023-07-19 ENCOUNTER — Telehealth: Payer: Self-pay | Admitting: Pharmacist

## 2023-07-19 NOTE — Telephone Encounter (Signed)
 Mychart sent for Wegovy  dose titration

## 2023-07-20 ENCOUNTER — Other Ambulatory Visit (HOSPITAL_BASED_OUTPATIENT_CLINIC_OR_DEPARTMENT_OTHER): Payer: Self-pay

## 2023-07-20 MED ORDER — WEGOVY 1.7 MG/0.75ML ~~LOC~~ SOAJ
1.7000 mg | SUBCUTANEOUS | 0 refills | Status: DC
  Filled 2023-07-20 – 2023-07-31 (×2): qty 3, 28d supply, fill #0

## 2023-07-20 NOTE — Addendum Note (Signed)
Addended by: Tylene Fantasia on: 07/20/2023 02:40 PM   Modules accepted: Orders

## 2023-07-20 NOTE — Telephone Encounter (Signed)
Spoke to patient, doing well on 1 mg Wegovy lost around 5 lbs so far and ready to move on to 1.7 mg dose.

## 2023-07-22 NOTE — Progress Notes (Incomplete)
CHIEF COMPLAINT: No chief complaint on file.  _____________________________________________________________ SUBJECTIVE  HPI  Pt is a 63 y.o. female here for f/u R hand pain  Last seen 06/05/2023 for right hand pain  Hand x-ray at that time demonstrated signs of mild to moderate osteoarthritis primarily DIPJ 2-5 with osteophytic changes, triscaphe joint space narrowing, second CMC joint space narrowing, thumb IPJ joint space narrowing with osteophytic changes  Has still been using it Pain is constantly 7/10, however today is at a 10/10 Celebrex x 2 weeks, pain was manageable, but gradually came back in the last week  Started around christmas time Wrapped it with a lidocaine patch which seemed to help it, wearing splint still  Yesterday with patch and splint,  Primarily located lateral finger/5th finger Radiating up her elbow Numbness/tingling: none Exacerbated by clenching and touching  Therapies tried: lidocaine patch, splinting, celebrex with some benefit  ------------------------------------------------------------------------------------------------------ Past Medical History:  Diagnosis Date  . Anemia    hx of iron deficient anemia  . Arthritis   . Blood transfusion without reported diagnosis   . Depression   . Gastric polyp   . GERD (gastroesophageal reflux disease)   . Hemorrhoids   . HLD (hyperlipidemia)   . Joint pain    left shoulder  . Kidney lesion    right  . Osteopenia 05/04/2017  . Postmenopausal     Past Surgical History:  Procedure Laterality Date  . ENDOMETRIAL ABLATION  2007   Novasure  . TUBAL LIGATION  1983      Outpatient Encounter Medications as of 07/17/2023  Medication Sig  . ASPIRIN LOW DOSE 81 MG tablet TAKE 1 TABLET BY MOUTH DAILY ** SWALLOW TABLET WHOLE**  . atorvastatin (LIPITOR) 80 MG tablet Take 1 tablet (80 mg total) by mouth daily.  Marland Kitchen buPROPion (WELLBUTRIN XL) 300 MG 24 hr tablet Take 1 tablet (300 mg total) by mouth daily.  .  cyclobenzaprine (FLEXERIL) 10 MG tablet TAKE 1 TABLET BY MOUTH TWO TIMES A DAY AS NEEDED FOR MUSCLE SPASMS  . ESTRACE VAGINAL 0.1 MG/GM vaginal cream Place 1 application vaginally at bedtime.  Marland Kitchen estradiol (ESTRACE) 0.5 MG tablet Take 0.5 mg by mouth daily.  Marland Kitchen ezetimibe (ZETIA) 10 MG tablet TAKE 1 TABLET BY MOUTH DAILY  . medroxyPROGESTERone (PROVERA) 2.5 MG tablet Take 1 tablet (2.5 mg total) by mouth daily.  . Multiple Vitamins-Minerals (MULTIVITAMIN WITH MINERALS) tablet 1 tablet.  Marland Kitchen omeprazole (PRILOSEC) 40 MG capsule Take 1 capsule (40 mg total) by mouth daily.  . ranolazine (RANEXA) 500 MG 12 hr tablet Take 1 tablet (500 mg total) by mouth 2 (two) times daily.  . Semaglutide-Weight Management (WEGOVY) 1 MG/0.5ML SOAJ Inject 1 mg into the skin once a week.  . zolpidem (AMBIEN) 10 MG tablet TAKE ONE HALF TO ONE TABLET BY MOUTH EVERY NIGHT AT BEDTIME AS NEEDED FOR SLEEP   No facility-administered encounter medications on file as of 07/17/2023.    ------------------------------------------------------------------------------------------------------  _____________________________________________________________ OBJECTIVE  PHYSICAL EXAM  Today's Vitals   07/17/23 1301  BP: 118/86  Weight: 150 lb (68 kg)  Height: 5\' 3"  (1.6 m)  PainSc: 10-Worst pain ever  PainLoc: Finger   Body mass index is 26.57 kg/m.   reviewed  General: A+Ox3, no acute distress, well-nourished, appropriate affect CV: pulses 2+ regular, nondiaphoretic, no peripheral edema, cap refill <2sec Lungs: no audible wheezing, non-labored breathing, bilateral chest rise/fall, nontachypneic Skin: warm, well-perfused, non-icteric, no susp lesions or rashes Neuro: Sensation intact, muscle tone wnl, no atrophy Psych:  no signs of depression or anxiety MSK: ***    HAND/WRIST: *** wrist clean/dry/intact skin. Full ROM without pain. *** soft tissue swelling.  *** tenderness along 1st extensor compartment.  ***Finkelstein's.  *** bony tenderness.  *** snuff box tenderness.  ***tinels at carpal tunnel, *** Phalen's.  Normal grip strength. *** tenderness over carpals, metacarpals, phalanges.  ***wrist with FROM without pain, weakness, instability.  Lateral PIP _____________________________________________________________ ASSESSMENT/PLAN Diagnoses and all orders for this visit:  Osteoarthritis of right hand, unspecified osteoarthritis type -     Ambulatory referral to Occupational Therapy  Other orders -     methylPREDNISolone (MEDROL DOSEPAK) 4 MG TBPK tablet; Take as instructed in the kit    POCUS MRI   Pennsaid, ice, medrol dosepak Declines MRI   Summary: ***  Differential Diagnosis:  Treatment:   No follow-ups on file.  Electronically signed by: Burna Forts, MD 07/17/2023 12:20 PM

## 2023-07-26 ENCOUNTER — Encounter: Payer: Self-pay | Admitting: Family Medicine

## 2023-07-31 ENCOUNTER — Other Ambulatory Visit (HOSPITAL_BASED_OUTPATIENT_CLINIC_OR_DEPARTMENT_OTHER): Payer: Self-pay

## 2023-08-02 ENCOUNTER — Encounter: Payer: Self-pay | Admitting: Family

## 2023-08-02 ENCOUNTER — Other Ambulatory Visit (INDEPENDENT_AMBULATORY_CARE_PROVIDER_SITE_OTHER): Payer: No Typology Code available for payment source

## 2023-08-02 DIAGNOSIS — N289 Disorder of kidney and ureter, unspecified: Secondary | ICD-10-CM | POA: Diagnosis not present

## 2023-08-02 LAB — BASIC METABOLIC PANEL
BUN: 9 mg/dL (ref 6–23)
CO2: 30 meq/L (ref 19–32)
Calcium: 9.5 mg/dL (ref 8.4–10.5)
Chloride: 106 meq/L (ref 96–112)
Creatinine, Ser: 0.69 mg/dL (ref 0.40–1.20)
GFR: 92.94 mL/min (ref >60.00)
Glucose, Bld: 82 mg/dL (ref 70–99)
Potassium: 4.5 meq/L (ref 3.5–5.1)
Sodium: 142 meq/L (ref 135–145)

## 2023-08-03 NOTE — Therapy (Addendum)
OUTPATIENT OCCUPATIONAL THERAPY ORTHO EVALUATION  Patient Name: Haley Fuentes MRN: [id number] DOB:January 09, 1961, 63 y.o., female Today's Date: 08/07/2023  PCP: Arie Sabina NP REFERRING PROVIDER: Dr. Lennette Bihari  END OF SESSION:  OT End of Session - 08/07/23 0942     Visit Number 1    Number of Visits 6    Date for OT Re-Evaluation 10/02/23    Authorization Type [employer]etna    Authorization Time Period 8 weeks    Authorization - Visit Number 1    OT Start Time 0805    OT Stop Time 0844    OT Time Calculation (min) 39 min             Past Medical History:  Diagnosis Date   Anemia    hx of iron deficient anemia   Arthritis    Blood transfusion without reported diagnosis    Depression    Gastric polyp    GERD (gastroesophageal reflux disease)    Hemorrhoids    HLD (hyperlipidemia)    Joint pain    left shoulder   Kidney lesion    right   Osteopenia 05/04/2017   Postmenopausal    Past Surgical History:  Procedure Laterality Date   ENDOMETRIAL ABLATION  2007   Novasure   TUBAL LIGATION  1983   Patient Active Problem List   Diagnosis Date Noted   Trigger little finger of right hand 07/08/2022   Achilles tendinosis of right ankle 03/17/2022   Plantar fascial fibromatosis of right foot 03/17/2022   Morbid obesity (HCC) 12/07/2021   Coronary artery disease involving native coronary artery of native heart without angina pectoris 12/03/2021   OSA (obstructive sleep apnea) 12/03/2021   Mixed hyperlipidemia 12/03/2021   Primary hypertension 12/03/2021   Cough 11/19/2021   Constipation 05/31/2021   Arthritis 03/10/2021   Gastric polyp 03/10/2021   Kidney lesion 03/10/2021   Postmenopausal 03/10/2021   Sprain of anterior talofibular ligament of left ankle 10/27/2020   Degenerative tear of lateral meniscus of left knee 10/27/2020   Skin rash 09/23/2020   Effusion of joint of left shoulder 11/12/2019   Subacromial bursitis 05/24/2019   Osteopenia 05/04/2017   Nonspecific  abnormal electrocardiogram (ECG) (EKG) 04/14/2015   TMJ (temporomandibular joint syndrome) 04/11/2015   Preventative health care 01/30/2015   Arthralgia 01/30/2015   Tendonitis, Achilles, right 09/04/2014   Epigastric pain 10/17/2013   Carpal tunnel syndrome 10/08/2013   GERD (gastroesophageal reflux disease) 01/14/2013   Allergic rhinitis 02/17/2012   Low back pain 09/23/2011   Anemia 04/29/2011   Chest pain 03/17/2011   INSOMNIA, CHRONIC 09/17/2010   Overweight 01/12/2009   Hyperlipidemia 12/02/2008   Depression 11/10/2007   Hemorrhoids 11/10/2007    ONSET DATE: 07/17/23  REFERRING DIAG:  Diagnosis  M19.041 (ICD-10-CM) - Osteoarthritis of right hand, unspecified osteoarthritis type    THERAPY DIAG:  Stiffness of right hand, not elsewhere classified - Plan: Ot plan of care cert/re-cert  Pain in right hand - Plan: Ot plan of care cert/re-cert  Localized edema - Plan: Ot plan of care cert/re-cert  Muscle weakness (generalized) - Plan: Ot plan of care cert/re-cert  Rationale for Evaluation and Treatment: Rehabilitation  SUBJECTIVE:   SUBJECTIVE STATEMENT: Pt reports pain in hand and 5th digit Pt accompanied by: self  PERTINENT HISTORY: Hand x-ray demonstrated signs of mild to moderate osteoarthritis primarily DIPJ 2-5 with osteophytic changes, triscaphe joint space narrowing, second CMC joint space narrowing, thumb IPJ joint space narrowing with osteophytic changes, pain mostly 5th digit  PRECAUTIONS: None    WEIGHT BEARING RESTRICTIONS: No  PAIN:  Are you having pain? Yes: NPRS scale: 3-4/10 Pain location:  right 5th  digit Pain description: aching Aggravating factors: gripping  Relieving factors: lidocane patch  FALLS: Has patient fallen in last 6 months? No  LIVING ENVIRONMENT: Lives with: lives with their family and lives with their spouse   PLOF: Independent  PATIENT GOALS: decrease pain and increase ROM  NEXT MD VISIT: prn  OBJECTIVE:   Note: Objective measures were completed at Evaluation unless otherwise noted.  HAND DOMINANCE: Right  ADLs: Overall ADLs: mod I with all basic  ADLS, pt reports difficulty opening containers, opening fridge, pain with writing and difficulty perfroming heavier household chores   Pt reports pain with gripping activities  FUNCTIONAL OUTCOME MEASURES: Quick Dash: 40% disability  UPPER EXTREMITY ROM:   grossly 80% composite finger flexion for RUE   Active ROM Right eval Left eval  Thumb MCP (0-60)    Thumb IP (0-80)    Thumb Radial abd/add (0-55)     Thumb Palmar abd/add (0-45)     Thumb Opposition to Small Finger     Index MCP (0-90)     Index PIP (0-100)     Index DIP (0-70)      Long MCP (0-90)      Long PIP (0-100)      Long DIP (0-70) -30 ext lag     Ring MCP (0-90)      Ring PIP (0-100)      Ring DIP (0-70)      Little MCP (0-90)  65    Little PIP (0-100) 65     Little DIP (0-70) 35/-30 ext lag     (Blank rows = not tested)    HAND FUNCTION: Grip strength: Right: 17 lbs; Left: 25 lbs   SENSATION: Light touch: WFL  EDEMA:  mild edema at 5th digit  COGNITION: Overall cognitive status: Within functional limits for tasks assessed   OBSERVATIONS: Pleasant female motivated to improve pain and hand function   TREATMENT DATE: 08/07/23- eval completed, pt was instructed in gentle A/ROM HEP to perfrom within pain free ROM.                                                                                                                            Modalities: Paraffin: for pain and stiffness Time: 8 mins Location: right hand  no adverse reactions       PATIENT EDUCATION: Education details: role of OT, potential goals, gentle A/ROM HEP, foam grip issued for handwriting Person educated: Patient Education method: Explanation, demonstrateion, handout Education comprehension: verbalized understanding  HOME EXERCISE PROGRAM: n/a  GOALS: Goals reviewed with  patient? No    LONG TERM GOALS: Target date: 10/02/23  I with HEP  Goal status: INITIAL  2.  Pt will verbalize understanding of activity modification, joint protection and AE to decrease pain and increase independence.  Goal status: INITIAL  3.  I with positioning, and splint wear prn to minimize pain and reisk for deformity  Goal status: INITIAL  4.  Pt will improve Quick Dash score to 36% or better disability.  Goal status: INITIAL   ASSESSMENT:  CLINICAL IMPRESSION: Patient is a 63 y.o. female who was seen today for occupational therapy evaluation for osteoarthritis of the right hand. Pt can benefit from skilled occupational therapy to address performance deficits below in order to  minimize pain, to maximize pt's safety and I with daily activities.  PERFORMANCE DEFICITS: in functional skills including ADLs, IADLs, coordination, dexterity, edema, ROM, strength, pain, flexibility, Fine motor control, decreased knowledge of precautions, decreased knowledge of use of DME, and UE functional use,  and psychosocial skills including coping strategies, environmental adaptation, habits, interpersonal interactions, and routines and behaviors.   IMPAIRMENTS: are limiting patient from ADLs, IADLs, rest and sleep, work, play, leisure, and social participation.   COMORBIDITIES: may have co-morbidities  that affects occupational performance. Patient will benefit from skilled OT to address above impairments and improve overall function.  MODIFICATION OR ASSISTANCE TO COMPLETE EVALUATION: No modification of tasks or assist necessary to complete an evaluation.  OT OCCUPATIONAL PROFILE AND HISTORY: Detailed assessment: Review of records and additional review of physical, cognitive, psychosocial history related to current functional performance.  CLINICAL DECISION MAKING: LOW - limited treatment options, no task modification necessary  REHAB POTENTIAL: Good  EVALUATION COMPLEXITY:  Low      PLAN:  OT FREQUENCY:  5 visits plus eval  OT DURATION: 8 weeks  PLANNED INTERVENTIONS: 97168 OT Re-evaluation, 97535 self care/ADL training, 40981 therapeutic exercise, 97530 therapeutic activity, 97112 neuromuscular re-education, 97140 manual therapy, 97035 ultrasound, 97018 paraffin, 19147 moist heat, 97010 cryotherapy, 97034 contrast bath, 97760 Orthotics management and training, 82956 Splinting (initial encounter), passive range of motion, energy conservation, coping strategies training, patient/family education, and DME and/or AE instructions  RECOMMENDED OTHER SERVICES: none  CONSULTED AND AGREED WITH PLAN OF CARE: Patient  PLAN FOR NEXT SESSION: paraffin, activity modification, consider splinting   Dierdra Salameh, OT 08/07/2023, 10:06 AM

## 2023-08-07 ENCOUNTER — Ambulatory Visit: Payer: No Typology Code available for payment source | Attending: Family Medicine | Admitting: Occupational Therapy

## 2023-08-07 DIAGNOSIS — M25641 Stiffness of right hand, not elsewhere classified: Secondary | ICD-10-CM | POA: Diagnosis present

## 2023-08-07 DIAGNOSIS — R600 Localized edema: Secondary | ICD-10-CM | POA: Insufficient documentation

## 2023-08-07 DIAGNOSIS — M19041 Primary osteoarthritis, right hand: Secondary | ICD-10-CM | POA: Insufficient documentation

## 2023-08-07 DIAGNOSIS — M6281 Muscle weakness (generalized): Secondary | ICD-10-CM | POA: Diagnosis present

## 2023-08-07 DIAGNOSIS — M79641 Pain in right hand: Secondary | ICD-10-CM | POA: Insufficient documentation

## 2023-08-07 NOTE — Patient Instructions (Signed)
Flexor Tendon Gliding (Active Hook Fist)   With fingers and knuckles straight, bend middle and tip joints. Do not bend large knuckles. Repeat _10-15___ times. Do _1-2__ sessions per day.  MP Flexion (Active)   With back of hand on table, bend large knuckles as far as they will go, keeping small joints straight. Repeat _10-15___ times. Do __1-2_ sessions per day. Activity: Reach into a narrow container.*      Finger Flexion / Extension   With palm up, bend fingers of left hand toward palm, making a  fist. Straighten fingers, opening fist. Repeat sequence _10-15___ times per session. Do _1-2__ sessions per day. Hand Variation: Palm down   Copyright  VHI. All rights reserved.    AROM: PIP Flexion / Extension   Pinch bottom knuckle of _small/ pinky_______ finger of hand to prevent bending. Actively bend middle knuckle until stretch is felt. Hold __5__ seconds. Relax. Straighten finger as far as possible. Repeat __10-15__ times per set. Do _4-6___ sessions per day.   AROM: DIP Flexion / Extension   Pinch middle knuckle of __small/ pinky______ finger of  hand to prevent bending. Bend end knuckle until stretch is felt. Hold _5___ seconds. Relax. Straighten finger as far as possible. Repeat _10-15___ times per set.  Do _1-2___ sessions per day.  AROM: Finger Flexion / Extension   Actively bend fingers of  hand. Start with knuckles furthest from palm, and slowly make a fist. Hold __5__ seconds. Relax. Then straighten fingers as far as possible. Repeat _10-15___ times per set.  Do _1-2__ sessions per day.  Copyright  [employer]. All rights reserved.

## 2023-08-21 ENCOUNTER — Ambulatory Visit: Payer: No Typology Code available for payment source | Admitting: Occupational Therapy

## 2023-08-21 DIAGNOSIS — M25641 Stiffness of right hand, not elsewhere classified: Secondary | ICD-10-CM | POA: Diagnosis not present

## 2023-08-21 DIAGNOSIS — R600 Localized edema: Secondary | ICD-10-CM

## 2023-08-21 DIAGNOSIS — M6281 Muscle weakness (generalized): Secondary | ICD-10-CM

## 2023-08-21 DIAGNOSIS — M79641 Pain in right hand: Secondary | ICD-10-CM

## 2023-08-21 NOTE — Therapy (Signed)
OUTPATIENT OCCUPATIONAL THERAPY ORTHO Treatment  Patient Name: Haley Fuentes MRN: [id number] DOB:1961-02-05, 63 y.o., female Today's Date: 08/21/2023  PCP: Arie Sabina NP REFERRING PROVIDER: Dr. Lennette Bihari  END OF SESSION:  OT End of Session - 08/21/23 1541     Visit Number 2    Number of Visits 6    Date for OT Re-Evaluation 10/02/23    Authorization Type aetna    Authorization Time Period 8 weeks    Authorization - Visit Number 2    OT Start Time 1530    OT Stop Time 1615    OT Time Calculation (min) 45 min    Activity Tolerance Patient tolerated treatment well    Behavior During Therapy WFL for tasks assessed/performed              Past Medical History:  Diagnosis Date   Anemia    hx of iron deficient anemia   Arthritis    Blood transfusion without reported diagnosis    Depression    Gastric polyp    GERD (gastroesophageal reflux disease)    Hemorrhoids    HLD (hyperlipidemia)    Joint pain    left shoulder   Kidney lesion    right   Osteopenia 05/04/2017   Postmenopausal    Past Surgical History:  Procedure Laterality Date   ENDOMETRIAL ABLATION  2007   Novasure   TUBAL LIGATION  1983   Patient Active Problem List   Diagnosis Date Noted   Trigger little finger of right hand 07/08/2022   Achilles tendinosis of right ankle 03/17/2022   Plantar fascial fibromatosis of right foot 03/17/2022   Morbid obesity (HCC) 12/07/2021   Coronary artery disease involving native coronary artery of native heart without angina pectoris 12/03/2021   OSA (obstructive sleep apnea) 12/03/2021   Mixed hyperlipidemia 12/03/2021   Primary hypertension 12/03/2021   Cough 11/19/2021   Constipation 05/31/2021   Arthritis 03/10/2021   Gastric polyp 03/10/2021   Kidney lesion 03/10/2021   Postmenopausal 03/10/2021   Sprain of anterior talofibular ligament of left ankle 10/27/2020   Degenerative tear of lateral meniscus of left knee 10/27/2020   Skin rash 09/23/2020    Effusion of joint of left shoulder 11/12/2019   Subacromial bursitis 05/24/2019   Osteopenia 05/04/2017   Nonspecific abnormal electrocardiogram (ECG) (EKG) 04/14/2015   TMJ (temporomandibular joint syndrome) 04/11/2015   Preventative health care 01/30/2015   Arthralgia 01/30/2015   Tendonitis, Achilles, right 09/04/2014   Epigastric pain 10/17/2013   Carpal tunnel syndrome 10/08/2013   GERD (gastroesophageal reflux disease) 01/14/2013   Allergic rhinitis 02/17/2012   Low back pain 09/23/2011   Anemia 04/29/2011   Chest pain 03/17/2011   INSOMNIA, CHRONIC 09/17/2010   Overweight 01/12/2009   Hyperlipidemia 12/02/2008   Depression 11/10/2007   Hemorrhoids 11/10/2007    ONSET DATE: 07/17/23  REFERRING DIAG:  Diagnosis  M19.041 (ICD-10-CM) - Osteoarthritis of right hand, unspecified osteoarthritis type    THERAPY DIAG:  Pain in right hand  Stiffness of right hand, not elsewhere classified  Localized edema  Muscle weakness (generalized)  Rationale for Evaluation and Treatment: Rehabilitation  SUBJECTIVE:   SUBJECTIVE STATEMENT: Pt reports continued pain in 5th digit Pt accompanied by: self  PERTINENT HISTORY: Hand x-ray demonstrated signs of mild to moderate osteoarthritis primarily DIPJ 2-5 with osteophytic changes, triscaphe joint space narrowing, second CMC joint space narrowing, thumb IPJ joint space narrowing with osteophytic changes, pain mostly 5th digit    PRECAUTIONS: None    WEIGHT BEARING  RESTRICTIONS: No  PAIN:  Are you having pain? Yes: NPRS scale: 3-6/10 Pain location:  right 5th  digit Pain description: aching Aggravating factors: gripping  Relieving factors: lidocane patch  FALLS: Has patient fallen in last 6 months? No  LIVING ENVIRONMENT: Lives with: lives with their family and lives with their spouse   PLOF: Independent  PATIENT GOALS: decrease pain and increase ROM  NEXT MD VISIT: prn  OBJECTIVE:  Note: Objective measures were  completed at Evaluation unless otherwise noted.  HAND DOMINANCE: Right  ADLs: Overall ADLs: mod I with all basic  ADLS, pt reports difficulty opening containers, opening fridge, pain with writing and difficulty perfroming heavier household chores   Pt reports pain with gripping activities  FUNCTIONAL OUTCOME MEASURES: Quick Dash: 40% disability  UPPER EXTREMITY ROM:   grossly 80% composite finger flexion for RUE   Active ROM Right eval Left eval  Thumb MCP (0-60)    Thumb IP (0-80)    Thumb Radial abd/add (0-55)     Thumb Palmar abd/add (0-45)     Thumb Opposition to Small Finger     Index MCP (0-90)     Index PIP (0-100)     Index DIP (0-70)      Long MCP (0-90)      Long PIP (0-100)      Long DIP (0-70) -30 ext lag     Ring MCP (0-90)      Ring PIP (0-100)      Ring DIP (0-70)      Little MCP (0-90)  65    Little PIP (0-100) 65     Little DIP (0-70) 35/-30 ext lag     (Blank rows = not tested)    HAND FUNCTION: Grip strength: Right: 17 lbs; Left: 25 lbs   SENSATION: Light touch: WFL  EDEMA:  mild edema at 5th digit  COGNITION: Overall cognitive status: Within functional limits for tasks assessed   OBSERVATIONS: Pleasant female motivated to improve pain and hand function   TREATMENT DATE: 08/21/23- Paraffin x 8 mins to RUE for pain and stiffness. no adverse reactions  08/07/23- eval completed, pt was instructed in gentle A/ROM HEP to perform within pain free ROM.                                                                                                                            Modalities: Paraffin: for pain and stiffness Time: 8 mins Location: right hand  no adverse reactions       PATIENT EDUCATION: Education details: use of buddy strap,  Person educated: Patient Education method: Explanation, demonstrateion, handout Education comprehension: verbalized understanding  HOME EXERCISE PROGRAM: n/a  GOALS: Goals reviewed with patient?  No    LONG TERM GOALS: Target date: 10/02/23  I with HEP  Goal status:   2.  Pt will verbalize understanding of activity modification, joint protection and AE to decrease pain and increase independence.  Goal status:  3.  I with positioning, and splint wear prn to minimize pain and risk for deformity  Goal status: INITIAL  4.  Pt will improve Quick Dash score to 36% or better disability.  Goal status: INITIAL   ASSESSMENT:  CLINICAL IMPRESSION: Patient is a 63 y.o. female who was seen today for occupational therapy evaluation for osteoarthritis of the right hand. Pt can benefit from skilled occupational therapy to address performance deficits below in order to  minimize pain, to maximize pt's safety and I with daily activities.  PERFORMANCE DEFICITS: in functional skills including ADLs, IADLs, coordination, dexterity, edema, ROM, strength, pain, flexibility, Fine motor control, decreased knowledge of precautions, decreased knowledge of use of DME, and UE functional use,  and psychosocial skills including coping strategies, environmental adaptation, habits, interpersonal interactions, and routines and behaviors.   IMPAIRMENTS: are limiting patient from ADLs, IADLs, rest and sleep, work, play, leisure, and social participation.   COMORBIDITIES: may have co-morbidities  that affects occupational performance. Patient will benefit from skilled OT to address above impairments and improve overall function.  MODIFICATION OR ASSISTANCE TO COMPLETE EVALUATION: No modification of tasks or assist necessary to complete an evaluation.  OT OCCUPATIONAL PROFILE AND HISTORY: Detailed assessment: Review of records and additional review of physical, cognitive, psychosocial history related to current functional performance.  CLINICAL DECISION MAKING: LOW - limited treatment options, no task modification necessary  REHAB POTENTIAL: Good  EVALUATION COMPLEXITY: Low      PLAN:  OT  FREQUENCY:  5 visits plus eval  OT DURATION: 8 weeks  PLANNED INTERVENTIONS: 97168 OT Re-evaluation, 97535 self care/ADL training, 40981 therapeutic exercise, 97530 therapeutic activity, 97112 neuromuscular re-education, 97140 manual therapy, 97035 ultrasound, 97018 paraffin, 19147 moist heat, 97010 cryotherapy, 97034 contrast bath, 97760 Orthotics management and training, 82956 Splinting (initial encounter), passive range of motion, energy conservation, coping strategies training, patient/family education, and DME and/or AE instructions  RECOMMENDED OTHER SERVICES: none  CONSULTED AND AGREED WITH PLAN OF CARE: Patient  PLAN FOR NEXT SESSION: paraffin, activity modification, consider splinting   Stevie Charter, OT 08/21/2023, 3:46 PM

## 2023-08-21 NOTE — Patient Instructions (Signed)
Wear your buddy loop over ring and small fingers during day for comfort, remove if increased pain. Your Splint This splint should initially be fitted by a healthcare practitioner.  The healthcare practitioner is responsible for providing wearing instructions and precautions to the patient, other healthcare practitioners and care provider involved in the patient's care.  This splint was custom made for you. Please read the following instructions to learn about wearing and caring for your splint.  Precautions Should your splint cause any of the following problems, remove the splint immediately and contact your therapist/physician. Swelling Severe Pain Pressure Areas Stiffness Numbness  Do not wear your splint while operating machinery unless it has been fabricated for that purpose.  When To Wear Your Splint Wear your splint according to your therapist/physician instructions. Nights and rest periods only Trial while awake for 1-2 hours prior to sleeping in splint.  Care and Cleaning of Your Splint Keep your splint away from open flames. Your splint will lose its shape in temperatures over 135 degrees Farenheit, ( in car windows, near radiators, ovens or in hot water).  Never make any adjustments to your splint, if the splint needs adjusting remove it and make an appointment to see your therapist. Your splint, including the cushion liner may be cleaned with soap and lukewarm water.  Do not immerse in hot water over 135 degrees Farenheit. Straps may be washed with soap and water, but do not moisten the self-adhesive portion.

## 2023-08-22 ENCOUNTER — Encounter: Payer: Self-pay | Admitting: Occupational Therapy

## 2023-08-24 ENCOUNTER — Other Ambulatory Visit: Payer: Self-pay

## 2023-08-24 ENCOUNTER — Telehealth: Payer: Self-pay | Admitting: Pharmacist

## 2023-08-24 ENCOUNTER — Other Ambulatory Visit (HOSPITAL_BASED_OUTPATIENT_CLINIC_OR_DEPARTMENT_OTHER): Payer: Self-pay

## 2023-08-24 MED ORDER — WEGOVY 1.7 MG/0.75ML ~~LOC~~ SOAJ
1.7000 mg | SUBCUTANEOUS | 6 refills | Status: DC
  Filled 2023-08-24 – 2023-09-08 (×2): qty 3, 28d supply, fill #0
  Filled 2023-10-06: qty 3, 28d supply, fill #1
  Filled 2023-11-08 – 2023-11-09 (×2): qty 3, 28d supply, fill #2
  Filled 2024-01-01: qty 3, 28d supply, fill #3
  Filled 2024-01-31: qty 3, 28d supply, fill #4
  Filled 2024-03-06: qty 3, 28d supply, fill #5
  Filled 2024-04-04: qty 3, 28d supply, fill #6

## 2023-08-24 NOTE — Telephone Encounter (Signed)
Feeing good at 1.7 dose. Current weight 139 and that is goal weight. Would like to stay on the same dose Wegovy. Prescription with 6 refills sent to preferred pharmacy.

## 2023-08-28 ENCOUNTER — Encounter: Payer: Self-pay | Admitting: Occupational Therapy

## 2023-08-28 ENCOUNTER — Ambulatory Visit: Payer: No Typology Code available for payment source | Admitting: Occupational Therapy

## 2023-08-28 DIAGNOSIS — M25641 Stiffness of right hand, not elsewhere classified: Secondary | ICD-10-CM

## 2023-08-28 DIAGNOSIS — R600 Localized edema: Secondary | ICD-10-CM

## 2023-08-28 DIAGNOSIS — M79641 Pain in right hand: Secondary | ICD-10-CM

## 2023-08-28 DIAGNOSIS — M6281 Muscle weakness (generalized): Secondary | ICD-10-CM

## 2023-08-28 NOTE — Therapy (Signed)
 OUTPATIENT OCCUPATIONAL THERAPY ORTHO Treatment  Patient Name: Haley Fuentes MRN: [id number] DOB:1960/10/26, 63 y.o., female Today's Date: 08/28/2023  PCP: Arie Sabina NP REFERRING PROVIDER: Dr. Lennette Bihari  END OF SESSION:  OT End of Session - 08/28/23 1618     Visit Number 3    Date for OT Re-Evaluation 10/02/23    Authorization Type aetna    Authorization Time Period 8 weeks    Authorization - Visit Number 3    OT Start Time 1533    OT Stop Time 1610    OT Time Calculation (min) 37 min               Past Medical History:  Diagnosis Date   Anemia    hx of iron deficient anemia   Arthritis    Blood transfusion without reported diagnosis    Depression    Gastric polyp    GERD (gastroesophageal reflux disease)    Hemorrhoids    HLD (hyperlipidemia)    Joint pain    left shoulder   Kidney lesion    right   Osteopenia 05/04/2017   Postmenopausal    Past Surgical History:  Procedure Laterality Date   ENDOMETRIAL ABLATION  2007   Novasure   TUBAL LIGATION  1983   Patient Active Problem List   Diagnosis Date Noted   Trigger little finger of right hand 07/08/2022   Achilles tendinosis of right ankle 03/17/2022   Plantar fascial fibromatosis of right foot 03/17/2022   Morbid obesity (HCC) 12/07/2021   Coronary artery disease involving native coronary artery of native heart without angina pectoris 12/03/2021   OSA (obstructive sleep apnea) 12/03/2021   Mixed hyperlipidemia 12/03/2021   Primary hypertension 12/03/2021   Cough 11/19/2021   Constipation 05/31/2021   Arthritis 03/10/2021   Gastric polyp 03/10/2021   Kidney lesion 03/10/2021   Postmenopausal 03/10/2021   Sprain of anterior talofibular ligament of left ankle 10/27/2020   Degenerative tear of lateral meniscus of left knee 10/27/2020   Skin rash 09/23/2020   Effusion of joint of left shoulder 11/12/2019   Subacromial bursitis 05/24/2019   Osteopenia 05/04/2017   Nonspecific abnormal  electrocardiogram (ECG) (EKG) 04/14/2015   TMJ (temporomandibular joint syndrome) 04/11/2015   Preventative health care 01/30/2015   Arthralgia 01/30/2015   Tendonitis, Achilles, right 09/04/2014   Epigastric pain 10/17/2013   Carpal tunnel syndrome 10/08/2013   GERD (gastroesophageal reflux disease) 01/14/2013   Allergic rhinitis 02/17/2012   Low back pain 09/23/2011   Anemia 04/29/2011   Chest pain 03/17/2011   INSOMNIA, CHRONIC 09/17/2010   Overweight 01/12/2009   Hyperlipidemia 12/02/2008   Depression 11/10/2007   Hemorrhoids 11/10/2007    ONSET DATE: 07/17/23  REFERRING DIAG:  Diagnosis  M19.041 (ICD-10-CM) - Osteoarthritis of right hand, unspecified osteoarthritis type    THERAPY DIAG:  Pain in right hand  Stiffness of right hand, not elsewhere classified  Localized edema  Muscle weakness (generalized)  Rationale for Evaluation and Treatment: Rehabilitation  SUBJECTIVE:   SUBJECTIVE STATEMENT: Pt reports continued pain in 5th digit Pt accompanied by: self  PERTINENT HISTORY: Hand x-ray demonstrated signs of mild to moderate osteoarthritis primarily DIPJ 2-5 with osteophytic changes, triscaphe joint space narrowing, second CMC joint space narrowing, thumb IPJ joint space narrowing with osteophytic changes, pain mostly 5th digit    PRECAUTIONS: None    WEIGHT BEARING RESTRICTIONS: No  PAIN:  Are you having pain? Yes: NPRS scale: no pain on arrival, intermittent pain 3/10 Pain location:  right 5th  digit Pain description: aching Aggravating factors: gripping  Relieving factors: splint and buddy strap wear  FALLS: Has patient fallen in last 6 months? No  LIVING ENVIRONMENT: Lives with: lives with their family and lives with their spouse   PLOF: Independent  PATIENT GOALS: decrease pain and increase ROM  NEXT MD VISIT: prn  OBJECTIVE:  Note: Objective measures were completed at Evaluation unless otherwise noted.  HAND DOMINANCE:  Right  ADLs: Overall ADLs: mod I with all basic  ADLS, pt reports difficulty opening containers, opening fridge, pain with writing and difficulty perfroming heavier household chores   Pt reports pain with gripping activities  FUNCTIONAL OUTCOME MEASURES: Quick Dash: 40% disability  UPPER EXTREMITY ROM:   grossly 80% composite finger flexion for RUE   Active ROM Right eval Left eval  Thumb MCP (0-60)    Thumb IP (0-80)    Thumb Radial abd/add (0-55)     Thumb Palmar abd/add (0-45)     Thumb Opposition to Small Finger     Index MCP (0-90)     Index PIP (0-100)     Index DIP (0-70)      Long MCP (0-90)      Long PIP (0-100)      Long DIP (0-70) -30 ext lag     Ring MCP (0-90)      Ring PIP (0-100)      Ring DIP (0-70)      Little MCP (0-90)  65    Little PIP (0-100) 65     Little DIP (0-70) 35/-30 ext lag     (Blank rows = not tested)    HAND FUNCTION: Grip strength: Right: 17 lbs; Left: 25 lbs   SENSATION: Light touch: WFL  EDEMA:  mild edema at 5th digit  COGNITION: Overall cognitive status: Within functional limits for tasks assessed   OBSERVATIONS: Pleasant female motivated to improve pain and hand function   TREATMENT DATE: 08/28/23- Pt arrived wearing her buddy strap. Pt reports that her splint is fitting well without issues and she has been able to sleep in it without pain. Pt did not bring her splint with her her today, however she reports it does not need adjustments. Pt reports wearing the buddy strap at work and her pain is reduced overall. Paraffin x 10 mins to RUE for pain and stiffness, no adverse reactions while beginning to discuss activity modifications therpist recommends pt considers bicycle gloves/ exercise gloves for driving  with padding in the palm and support up to PIP joint. Pt was also shown a picture of soft finger splint that includes digits 4, 5 that pt can order from [employer] for digital support. Pt practiced using pen again with improved  comfort and decreased pain. Therapist recommends pt. slides heaver kitchen items rather than lifting when able and that the is aware of how she is gripping items for joint protection. A/ROM PIP fleixon, MP flexion then reverse blocking exercises performed.  08/21/23-  Pt performed A/ROM IP flexion/ hook fist and MP flexion followed by DIP blocking for 5th digit RUE. Pt with increased pain after this task so exercise was discontinued. Paraffin x 8 mins to RUE for pain and stiffness. no adverse reactions. Pt was provided with a buddy strap to wear during the day around 4th and 5th digit of RUE for support and educated in application and wear. Pt was fitted with a custom night time  hand based splint including ring and small finger. Pt was isntructed in splint  wear, care and precautions.   08/07/23- eval completed, pt was instructed in gentle A/ROM HEP to perform within pain free ROM.                                                                                                                            Modalities: Paraffin: for pain and stiffness Time: 8 mins Location: right hand  no adverse reactions       PATIENT EDUCATION: Education details: recommendations for bicycle gloves, buddy splint and pen again, activity modification/ joint protection strategies. Person educated: Patient Education method: Explanation, demonstration, handout Education comprehension: verbalized understanding  HOME EXERCISE PROGRAM: A/ROM splint wear  GOALS: Goals reviewed with patient? No    LONG TERM GOALS: Target date: 10/02/23  I with HEP  Goal status: met, 08/28/23  2.  Pt will verbalize understanding of activity modification, joint protection and AE to decrease pain and increase independence.  Goal status:  ongoing, 08/28/23  3.  I with positioning, and splint wear prn to minimize pain and risk for deformity  Goal status:  met, 08/28/23  4.  Pt will improve Quick Dash score to 36% or better  disability.  Goal status:  ongoing   ASSESSMENT:  CLINICAL IMPRESSION: Patient is progressing towards goals. She reports pslint is fitting well and she has decreased overall pain. PERFORMANCE DEFICITS: in functional skills including ADLs, IADLs, coordination, dexterity, edema, ROM, strength, pain, flexibility, Fine motor control, decreased knowledge of precautions, decreased knowledge of use of DME, and UE functional use,  and psychosocial skills including coping strategies, environmental adaptation, habits, interpersonal interactions, and routines and behaviors.   IMPAIRMENTS: are limiting patient from ADLs, IADLs, rest and sleep, work, play, leisure, and social participation.   COMORBIDITIES: may have co-morbidities  that affects occupational performance. Patient will benefit from skilled OT to address above impairments and improve overall function.  MODIFICATION OR ASSISTANCE TO COMPLETE EVALUATION: No modification of tasks or assist necessary to complete an evaluation.  OT OCCUPATIONAL PROFILE AND HISTORY: Detailed assessment: Review of records and additional review of physical, cognitive, psychosocial history related to current functional performance.  CLINICAL DECISION MAKING: LOW - limited treatment options, no task modification necessary  REHAB POTENTIAL: Good  EVALUATION COMPLEXITY: Low      PLAN:  OT FREQUENCY:  5 visits plus eval  OT DURATION: 8 weeks  PLANNED INTERVENTIONS: 97168 OT Re-evaluation, 97535 self care/ADL training, 91478 therapeutic exercise, 97530 therapeutic activity, 97112 neuromuscular re-education, 97140 manual therapy, 97035 ultrasound, 97018 paraffin, 29562 moist heat, 97010 cryotherapy, 97034 contrast bath, 97760 Orthotics management and training, 13086 Splinting (initial encounter), passive range of motion, energy conservation, coping strategies training, patient/family education, and DME and/or AE instructions  RECOMMENDED OTHER SERVICES:  none  CONSULTED AND AGREED WITH PLAN OF CARE: Patient  PLAN FOR NEXT SESSION: paraffin, activity modification, A/ROM   Tevyn Codd, OT 08/28/2023, 4:19 PM

## 2023-08-29 ENCOUNTER — Ambulatory Visit: Payer: No Typology Code available for payment source | Admitting: Occupational Therapy

## 2023-09-05 ENCOUNTER — Other Ambulatory Visit (HOSPITAL_BASED_OUTPATIENT_CLINIC_OR_DEPARTMENT_OTHER): Payer: Self-pay

## 2023-09-07 ENCOUNTER — Ambulatory Visit: Payer: No Typology Code available for payment source | Admitting: Occupational Therapy

## 2023-09-08 ENCOUNTER — Other Ambulatory Visit (HOSPITAL_BASED_OUTPATIENT_CLINIC_OR_DEPARTMENT_OTHER): Payer: Self-pay

## 2023-09-14 ENCOUNTER — Ambulatory Visit: Payer: No Typology Code available for payment source | Attending: Family Medicine | Admitting: Occupational Therapy

## 2023-09-14 DIAGNOSIS — M25641 Stiffness of right hand, not elsewhere classified: Secondary | ICD-10-CM | POA: Insufficient documentation

## 2023-09-14 DIAGNOSIS — R600 Localized edema: Secondary | ICD-10-CM | POA: Diagnosis present

## 2023-09-14 DIAGNOSIS — M6281 Muscle weakness (generalized): Secondary | ICD-10-CM | POA: Insufficient documentation

## 2023-09-14 DIAGNOSIS — M79641 Pain in right hand: Secondary | ICD-10-CM | POA: Diagnosis present

## 2023-09-14 NOTE — Therapy (Signed)
 OUTPATIENT OCCUPATIONAL THERAPY ORTHO Treatment  Patient Name: Haley Fuentes MRN: [id number] DOB:[birth year], 63 y.o., female Today's Date: 09/14/2023  PCP: Arie Sabina NP REFERRING PROVIDER: Dr. Lennette Bihari OCCUPATIONAL THERAPY DISCHARGE SUMMARY    Current functional level related to goals / functional outcomes: Pt met all long term goals   Remaining deficits: mild pain with use   Education / Equipment: Pt was educated in the following: splint wear and positioning, HEP and activity modification/ AE. Pt demonstrates understanding of all education.  Patient agrees to discharge. Patient goals were met. Patient is being discharged due to meeting the stated rehab goals. Pt is pleased with progress and feels ready for d/c.    END OF SESSION:  OT End of Session - 09/14/23 1404     Visit Number 4    Number of Visits 6    Date for OT Re-Evaluation 10/02/23    Authorization Time Period 8 weeks    Authorization - Visit Number 4    OT Start Time 1404    OT Stop Time 1445    OT Time Calculation (min) 41 min               Past Medical History:  Diagnosis Date   Anemia    hx of iron deficient anemia   Arthritis    Blood transfusion without reported diagnosis    Depression    Gastric polyp    GERD (gastroesophageal reflux disease)    Hemorrhoids    HLD (hyperlipidemia)    Joint pain    left shoulder   Kidney lesion    right   Osteopenia 05/04/2017   Postmenopausal    Past Surgical History:  Procedure Laterality Date   ENDOMETRIAL ABLATION  2007   Novasure   TUBAL LIGATION  1983   Patient Active Problem List   Diagnosis Date Noted   Trigger little finger of right hand 07/08/2022   Achilles tendinosis of right ankle 03/17/2022   Plantar fascial fibromatosis of right foot 03/17/2022   Morbid obesity (HCC) 12/07/2021   Coronary artery disease involving native coronary artery of native heart without angina pectoris 12/03/2021   OSA (obstructive sleep apnea)  12/03/2021   Mixed hyperlipidemia 12/03/2021   Primary hypertension 12/03/2021   Cough 11/19/2021   Constipation 05/31/2021   Arthritis 03/10/2021   Gastric polyp 03/10/2021   Kidney lesion 03/10/2021   Postmenopausal 03/10/2021   Sprain of anterior talofibular ligament of left ankle 10/27/2020   Degenerative tear of lateral meniscus of left knee 10/27/2020   Skin rash 09/23/2020   Effusion of joint of left shoulder 11/12/2019   Subacromial bursitis 05/24/2019   Osteopenia 05/04/2017   Nonspecific abnormal electrocardiogram (ECG) (EKG) 04/14/2015   TMJ (temporomandibular joint syndrome) 04/11/2015   Preventative health care 01/30/2015   Arthralgia 01/30/2015   Tendonitis, Achilles, right 09/04/2014   Epigastric pain 10/17/2013   Carpal tunnel syndrome 10/08/2013   GERD (gastroesophageal reflux disease) 01/14/2013   Allergic rhinitis 02/17/2012   Low back pain 09/23/2011   Anemia 04/29/2011   Chest pain 03/17/2011   INSOMNIA, CHRONIC 09/17/2010   Overweight 01/12/2009   Hyperlipidemia 12/02/2008   Depression 11/10/2007   Hemorrhoids 11/10/2007    ONSET DATE: 07/17/23  REFERRING DIAG:  Diagnosis  M19.041 (ICD-10-CM) - Osteoarthritis of right hand, unspecified osteoarthritis type    THERAPY DIAG:  Pain in right hand  Stiffness of right hand, not elsewhere classified  Localized edema  Rationale for Evaluation and Treatment: Rehabilitation  SUBJECTIVE:   SUBJECTIVE STATEMENT:  Pt reports her hand is better  PERTINENT HISTORY: Hand x-ray demonstrated signs of mild to moderate osteoarthritis primarily DIPJ 2-5 with osteophytic changes, triscaphe joint space narrowing, second CMC joint space narrowing, thumb IPJ joint space narrowing with osteophytic changes, pain mostly 5th digit    PRECAUTIONS: None    WEIGHT BEARING RESTRICTIONS: No  PAIN:  Are you having pain? Yes: NPRS scale: no pain at rest, at times very mild pain Pain location:  right 5th  digit Pain  description: aching Aggravating factors: gripping  Relieving factors: splint and buddy strap wear  FALLS: Has patient fallen in last 6 months? No  LIVING ENVIRONMENT: Lives with: lives with their family and lives with their spouse   PLOF: Independent  PATIENT GOALS: decrease pain and increase ROM  NEXT MD VISIT: prn  OBJECTIVE:  Note: Objective measures were completed at Evaluation unless otherwise noted.  HAND DOMINANCE: Right  ADLs: Overall ADLs: mod I with all basic  ADLS, pt reports difficulty opening containers, opening fridge, pain with writing and difficulty perfroming heavier household chores   Pt reports pain with gripping activities  FUNCTIONAL OUTCOME MEASURES: Quick Dash: 40% disability  UPPER EXTREMITY ROM:   grossly 80% composite finger flexion for RUE   Active ROM Right eval RUE 09/14/23   Thumb MCP (0-60)    Thumb IP (0-80)    Thumb Radial abd/add (0-55)     Thumb Palmar abd/add (0-45)     Thumb Opposition to Small Finger     Index MCP (0-90)     Index PIP (0-100)     Index DIP (0-70)      Long MCP (0-90)      Long PIP (0-100)      Long DIP (0-70) -30 ext lag     Ring MCP (0-90)      Ring PIP (0-100)      Ring DIP (0-70)      Little MCP (0-90)  65  80  Little PIP (0-100) 65   85  Little DIP (0-70) 35/-30 ext lag  -25 ext lag   (Blank rows = not tested)    HAND FUNCTION: Grip strength: Right: 17 lbs; Left: 25 lbs   SENSATION: Light touch: WFL  EDEMA:  mild edema at 5th digit  COGNITION: Overall cognitive status: Within functional limits for tasks assessed   OBSERVATIONS: Pleasant female motivated to improve pain and hand function   TREATMENT DATE: 09/14/23- Paraffin x 10 mins RUE, no adverse reactions Therapist checked progress towards goals. Pt feels comfortable with d/c today and is mostly pain free. Pt reports perfroming blocking exercises for 5th digit PIP joint, pt was shown gentle passive stretch to 5th digit DIP joint. A/ROM  MP flexion and PIP flexion.   08/28/23- Pt arrived wearing her buddy strap. Pt reports that her splint is fitting well without issues and she has been able to sleep in it without pain. Pt did not bring her splint with her her today, however she reports it does not need adjustments. Pt reports wearing the buddy strap at work and her pain is reduced overall. Paraffin x 10 mins to RUE for pain and stiffness, no adverse reactions while beginning to discuss activity modifications therpist recommends pt considers bicycle gloves/ exercise gloves for driving  with padding in the palm and support up to PIP joint. Pt was also shown a picture of soft finger splint that includes digits 4, 5 that pt can order from [employer] for digital support. Pt practiced  using pen again with improved comfort and decreased pain. Therapist recommends pt. slides heaver kitchen items rather than lifting when able and that the is aware of how she is gripping items for joint protection. A/ROM PIP flexion, MP flexion then reverse blocking exercises performed.  08/21/23-  Pt performed A/ROM IP flexion/ hook fist and MP flexion followed by DIP blocking for 5th digit RUE. Pt with increased pain after this task so exercise was discontinued. Paraffin x 8 mins to RUE for pain and stiffness. no adverse reactions. Pt was provided with a buddy strap to wear during the day around 4th and 5th digit of RUE for support and educated in application and wear. Pt was fitted with a custom night time  hand based splint including ring and small finger. Pt was isntructed in splint wear, care and precautions.   08/07/23- eval completed, pt was instructed in gentle A/ROM HEP to perform within pain free ROM.                                                                                                                            Modalities: Paraffin: for pain and stiffness Time: 8 mins Location: right hand  no adverse reactions       PATIENT  EDUCATION: Education details:  see above  Person educated: Patient Education method: Explanation, demonstration, handout Education comprehension: verbalized understanding  HOME EXERCISE PROGRAM: A/ROM splint wear  GOALS: Goals reviewed with patient? No    LONG TERM GOALS: Target date: 10/02/23  I with HEP  Goal status: met, 08/28/23  2.  Pt will verbalize understanding of activity modification, joint protection and AE to decrease pain and increase independence.  Goal status:  met, 09/14/23  3.  I with positioning, and splint wear prn to minimize pain and risk for deformity  Goal status:  met, 08/28/23  4.  Pt will improve Quick Dash score to 36% or better disability.  Goal status:  met, 17.5% 09/14/23  MP 80, PIP 85 ASSESSMENT:  CLINICAL IMPRESSION: Patient  made excellent overall progress. She met all long term goals. PERFORMANCE DEFICITS: in functional skills including ADLs, IADLs, coordination, dexterity, edema, ROM, strength, pain, flexibility, Fine motor control, decreased knowledge of precautions, decreased knowledge of use of DME, and UE functional use,  and psychosocial skills including coping strategies, environmental adaptation, habits, interpersonal interactions, and routines and behaviors.   IMPAIRMENTS: are limiting patient from ADLs, IADLs, rest and sleep, work, play, leisure, and social participation.   COMORBIDITIES: may have co-morbidities  that affects occupational performance. Patient will benefit from skilled OT to address above impairments and improve overall function.  MODIFICATION OR ASSISTANCE TO COMPLETE EVALUATION: No modification of tasks or assist necessary to complete an evaluation.  OT OCCUPATIONAL PROFILE AND HISTORY: Detailed assessment: Review of records and additional review of physical, cognitive, psychosocial history related to current functional performance.  CLINICAL DECISION MAKING: LOW - limited treatment options, no task modification  necessary  REHAB POTENTIAL:  Good  EVALUATION COMPLEXITY: Low      PLAN:  OT FREQUENCY:  5 visits plus eval  OT DURATION: 8 weeks  PLANNED INTERVENTIONS: 97168 OT Re-evaluation, 97535 self care/ADL training, 97110 therapeutic exercise, 97530 therapeutic activity, 97112 neuromuscular re-education, 97140 manual therapy, 97035 ultrasound, 97018 paraffin, 97010 moist heat, 97010 cryotherapy, 97034 contrast bath, 97760 Orthotics management and training, 97760 Splinting (initial encounter), passive range of motion, energy conservation, coping strategies training, patient/family education, and DME and/or AE instructions  RECOMMENDED OTHER SERVICES: none  CONSULTED AND AGREED WITH PLAN OF CARE: Patient  PLAN FOR NEXT SESSION:  d/c OT   Dekisha Mesmer, OT 09/14/2023, 2:05 PM

## 2023-09-18 ENCOUNTER — Ambulatory Visit: Payer: No Typology Code available for payment source | Attending: Cardiology | Admitting: Cardiology

## 2023-09-18 ENCOUNTER — Encounter: Payer: Self-pay | Admitting: Cardiology

## 2023-09-18 VITALS — HR 78 | Ht 62.0 in | Wt 143.4 lb

## 2023-09-18 DIAGNOSIS — E782 Mixed hyperlipidemia: Secondary | ICD-10-CM | POA: Diagnosis not present

## 2023-09-18 DIAGNOSIS — G4733 Obstructive sleep apnea (adult) (pediatric): Secondary | ICD-10-CM | POA: Diagnosis not present

## 2023-09-18 DIAGNOSIS — I10 Essential (primary) hypertension: Secondary | ICD-10-CM

## 2023-09-18 DIAGNOSIS — I2510 Atherosclerotic heart disease of native coronary artery without angina pectoris: Secondary | ICD-10-CM | POA: Diagnosis not present

## 2023-09-18 NOTE — Patient Instructions (Signed)
 Medication Instructions:  Your physician recommends that you continue on your current medications as directed. Please refer to the Current Medication list given to you today.  *If you need a refill on your cardiac medications before your next appointment, please call your pharmacy*   Lab Work: Lipids If you have labs (blood work) drawn today and your tests are completely normal, you will receive your results only by: MyChart Message (if you have MyChart) OR A paper copy in the mail If you have any lab test that is abnormal or we need to change your treatment, we will call you to review the results.   Follow-Up: At [facility], you and your health needs are our priority.  As part of our continuing mission to provide you with exceptional heart care, we have created designated Provider Care Teams.  These Care Teams include your primary Cardiologist (physician) and Advanced Practice Providers (APPs -  Physician Assistants and Nurse Practitioners) who all work together to provide you with the care you need, when you need it.  Your next appointment:   1 year(s)  Provider:   Thomasene Ripple, DO

## 2023-09-18 NOTE — Progress Notes (Unsigned)
 Cardiology Office Note:    Date:  09/22/2023   ID:  Haley Fuentes, DOB [birth year], MRN [id number]  PCP:  Sandford Craze, NP  Cardiologist:  Thomasene Ripple, DO  Electrophysiologist:  None   Referring MD: Sandford Craze, NP    History of Present Illness:    Haley Fuentes is a 63 y.o. female with a hx of hyperlipidemia, nonobstructive CAD seen on coronary CTA, obesity and sleep apnea here today for follow-up visit.  She reports that her symptoms have improved since the last visit. She is tolerating Ranexa well and has not experienced any chest pain. She is also on Zetia for cholesterol management. The patient leads a busy lifestyle, with limited time for physical activity. She expresses a willingness to try walking more when the weather improves. She also has weights at home and a foot pedal exerciser under her desk at work, which she uses to maintain muscle strength and mobility.  Past Medical History:  Diagnosis Date   Anemia    hx of iron deficient anemia   Arthritis    Blood transfusion without reported diagnosis    Depression    Gastric polyp    GERD (gastroesophageal reflux disease)    Hemorrhoids    HLD (hyperlipidemia)    Joint pain    left shoulder   Kidney lesion    right   Osteopenia 05/04/2017   Postmenopausal     Past Surgical History:  Procedure Laterality Date   ENDOMETRIAL ABLATION  2007   Novasure   TUBAL LIGATION  1983    Current Medications: Current Meds  Medication Sig   ASPIRIN LOW DOSE 81 MG tablet TAKE 1 TABLET BY MOUTH DAILY ** SWALLOW TABLET WHOLE**   buPROPion (WELLBUTRIN XL) 300 MG 24 hr tablet Take 1 tablet (300 mg total) by mouth daily.   cyclobenzaprine (FLEXERIL) 10 MG tablet TAKE 1 TABLET BY MOUTH TWO TIMES A DAY AS NEEDED FOR MUSCLE SPASMS   ESTRACE VAGINAL 0.1 MG/GM vaginal cream Place 1 application vaginally at bedtime.   estradiol (ESTRACE) 0.5 MG tablet Take 0.5 mg by mouth daily.   ezetimibe (ZETIA) 10 MG tablet TAKE 1 TABLET BY  MOUTH DAILY   medroxyPROGESTERone (PROVERA) 2.5 MG tablet Take 1 tablet (2.5 mg total) by mouth daily.   methylPREDNISolone (MEDROL DOSEPAK) 4 MG TBPK tablet Take as instructed in the kit   Multiple Vitamins-Minerals (MULTIVITAMIN WITH MINERALS) tablet 1 tablet.   omeprazole (PRILOSEC) 40 MG capsule Take 1 capsule (40 mg total) by mouth daily.   ranolazine (RANEXA) 500 MG 12 hr tablet Take 1 tablet (500 mg total) by mouth 2 (two) times daily.   Semaglutide-Weight Management (WEGOVY) 1.7 MG/0.75ML SOAJ Inject 1.7 mg into the skin once a week.   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    Employer: [employer]  Tobacco Use   Smoking status: Former    Current packs/day: 0.00    Average packs/day: 0.3 packs/day for 4.0 years (1.0 ttl pk-yrs)    Types: Cigarettes    Start date: 07/04/1981  Quit date: 07/04/1985    Years since quitting: 38.2   Smokeless tobacco: Never  Vaping Use   Vaping status: Never Used  Substance and Sexual Activity   Alcohol use: Yes    Alcohol/week: 0.0 standard drinks of alcohol    Comment: 4-5 times a week   Drug use: No   Sexual activity: Not on file  Other Topics Concern   Not on file  Social History Narrative   Regular exercise: 30 minutes 5 days a week   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      Social Drivers of Corporate investment banker Strain: Not on file  Food Insecurity: Not on file  Transportation  Needs: Not on file  Physical Activity: Not on file  Stress: Not on file  Social Connections: Not on file     Family History: The patient's family history includes Alcohol abuse in her brother; Anxiety disorder in her son; Breast cancer (age of onset: 38) in her mother; Depression in her son; Diabetes in her father and mother; Healthy in her daughter and son; Heart disease in her father and mother; Hepatitis in her brother; Raynaud syndrome in her sister; Stroke in her father.  ROS:   Review of Systems  Constitution: Negative for decreased appetite, fever and weight gain.  HENT: Negative for congestion, ear discharge, hoarse voice and sore throat.   Eyes: Negative for discharge, redness, vision loss in right eye and visual halos.  Cardiovascular: Negative for chest pain, dyspnea on exertion, leg swelling, orthopnea and palpitations.  Respiratory: Negative for cough, hemoptysis, shortness of breath and snoring.   Endocrine: Negative for heat intolerance and polyphagia.  Hematologic/Lymphatic: Negative for bleeding problem. Does not bruise/bleed easily.  Skin: Negative for flushing, nail changes, rash and suspicious lesions.  Musculoskeletal: Negative for arthritis, joint pain, muscle cramps, myalgias, neck pain and stiffness.  Gastrointestinal: Negative for abdominal pain, bowel incontinence, diarrhea and excessive appetite.  Genitourinary: Negative for decreased libido, genital sores and incomplete emptying.  Neurological: Negative for brief paralysis, focal weakness, headaches and loss of balance.  Psychiatric/Behavioral: Negative for altered mental status, depression and suicidal ideas.  Allergic/Immunologic: Negative for HIV exposure and persistent infections.    EKGs/Labs/Other Studies Reviewed:    The following studies were reviewed today:   EKG: None today   TTE6/10/2019 Aorta: Normal size. Scattered calcifications in the ascending and descending aorta. No dissection.    Aortic Valve:  Trileaflet.  No calcifications.   Coronary Arteries:  Normal coronary origin.  Right dominance.   RCA is a large dominant artery that gives rise to PDA and PLVB. There is no plaque.   Left main is a large artery that gives rise to LAD, Ramus and LCX arteries. There is mild calcified plaque in the distal LM with associated stenosis of 25-49%.   LAD is a large vessel that gives risk to a large diagonal. There is mild noncalcified plaque in the mid LAD with associated stenosis of 25-49%.   The ramus is a small sized vessel with no plaque.   LCX is a non-dominant artery that gives rise to one large OM1 branch. There is no plaque.   Other findings:   Normal pulmonary vein drainage into the left atrium.   Normal let atrial appendage without a thrombus.   Normal size of the pulmonary artery.  IMPRESSION: 1. Coronary calcium score of 53. This was 87th percentile for age and sex matched control.   2.  Normal coronary origin with right dominance.   3.  Mild atherosclerosis of the distal LM and mid LAD.  CAD-RADS=1.   4.  Consider non-atherosclerotic causes of chest pain.   5.  Recommend preventive therapy and risk factor modification.   6.  This study has been submitted for FFR analysis.   Armanda Magic    Recent Labs: 03/15/2023: ALT 26; Magnesium 2.0; TSH 1.730 08/02/2023: BUN 9; Creatinine, Ser 0.69; Potassium 4.5; Sodium 142  Recent Lipid Panel    Component Value Date/Time   CHOL 206 (H) 02/24/2023 0935   TRIG 100 02/24/2023 0935   HDL 62 02/24/2023 0935   CHOLHDL 3.3 02/24/2023 0935   CHOLHDL 3 07/22/2022 0745   VLDL 25.0 07/22/2022 0745   LDLCALC 126 (H) 02/24/2023 0935   LDLCALC 139 (H) 04/06/2020 0838   LDLDIRECT 203.0 11/19/2021 0830    Physical Exam:    VS:  Pulse 78   Ht 5\' 2"  (1.575 m)   Wt 143 lb 6.4 oz (65 kg)   LMP 07/04/2004   BMI 26.23 kg/m     Wt Readings from Last 3 Encounters:  09/18/23 143 lb 6.4 oz (65 kg)  07/17/23  150 lb (68 kg)  06/05/23 150 lb (68 kg)     GEN: Well nourished, well developed in no acute distress HEENT: Normal NECK: No JVD; No carotid bruits LYMPHATICS: No lymphadenopathy CARDIAC: S1S2 noted,RRR, no murmurs, rubs, gallops RESPIRATORY:  Clear to auscultation without rales, wheezing or rhonchi  ABDOMEN: Soft, non-tender, non-distended, +bowel sounds, no guarding. EXTREMITIES: No edema, No cyanosis, no clubbing MUSCULOSKELETAL:  No deformity  SKIN: Warm and dry NEUROLOGIC:  Alert and oriented x 3, non-focal PSYCHIATRIC:  Normal affect, good insight  ASSESSMENT:    1. Primary hypertension   2. Mixed hyperlipidemia   3. Coronary artery disease involving native coronary artery of native heart without angina pectoris   4. OSA (obstructive sleep apnea)    PLAN:   Chest pain controlled with Ranolazine. No worsening symptoms. - Continue Ranolazine. - Continue Ezetimibe. - Encourage physical activity.  Hypertension Blood pressure stable. - Monitor blood pressure regularly. - Encourage physical activity.  Hyperlipidemia On Ezetimibe. Follow-up lipid panel needed. - Order lipid panel. - Coordinate with primary care provider for blood work.  She is not using her CPAP.  I have asked the patient that it would be beneficial to use her CPAP to treat her sleep apnea as this could help with her fatigue.  In addition we will get blood work for TSH. Will get CMP as well as mag.  The patient is in agreement with the above plan. The patient left the office in stable condition.  The patient will follow up in   Medication Adjustments/Labs and Tests Ordered: Current medicines are reviewed at length with the patient today.  Concerns regarding medicines are outlined above.  Orders Placed This Encounter  Procedures   Lipid panel   EKG 12-Lead   No orders of the defined types were placed in this encounter.   Patient Instructions  Medication Instructions:  Your physician recommends  that you continue on your current medications as directed. Please refer to the Current Medication list given to you today.  *If you need a refill on your cardiac medications before your next appointment, please call your pharmacy*   Lab Work: Lipids If you have labs (blood work)  drawn today and your tests are completely normal, you will receive your results only by: MyChart Message (if you have MyChart) OR A paper copy in the mail If you have any lab test that is abnormal or we need to change your treatment, we will call you to review the results.   Follow-Up: At [facility], you and your health needs are our priority.  As part of our continuing mission to provide you with exceptional heart care, we have created designated Provider Care Teams.  These Care Teams include your primary Cardiologist (physician) and Advanced Practice Providers (APPs -  Physician Assistants and Nurse Practitioners) who all work together to provide you with the care you need, when you need it.   Your next appointment:   1 year(s)  Provider:   Thomasene Ripple, DO      Adopting a Healthy Lifestyle.  Know what a healthy weight is for you (roughly BMI <25) and aim to maintain this   Aim for 7+ servings of fruits and vegetables daily   65-80+ fluid ounces of water or unsweet tea for healthy kidneys   Limit to max 1 drink of alcohol per day; avoid smoking/tobacco   Limit animal fats in diet for cholesterol and heart health - choose grass fed whenever available   Avoid highly processed foods, and foods high in saturated/trans fats   Aim for low stress - take time to unwind and care for your mental health   Aim for 150 min of moderate intensity exercise weekly for heart health, and weights twice weekly for bone health   Aim for 7-9 hours of sleep daily   When it comes to diets, agreement about the perfect plan isnt easy to find, even among the experts. Experts at the [facility] of [employer]  developed an idea known as the Healthy Eating Plate. Just imagine a plate divided into logical, healthy portions.   The emphasis is on diet quality:   Load up on vegetables and fruits - one-half of your plate: Aim for color and variety, and remember that potatoes dont count.   Go for whole grains - one-quarter of your plate: Whole wheat, barley, wheat berries, quinoa, oats, brown rice, and foods made with them. If you want pasta, go with whole wheat pasta.   Protein power - one-quarter of your plate: Fish, chicken, beans, and nuts are all healthy, versatile protein sources. Limit red meat.   The diet, however, does go beyond the plate, offering a few other suggestions.   Use healthy plant oils, such as olive, canola, soy, corn, sunflower and peanut. Check the labels, and avoid partially hydrogenated oil, which have unhealthy trans fats.   If youre thirsty, drink water. Coffee and tea are good in moderation, but skip sugary drinks and limit milk and dairy products to one or two daily servings.   The type of carbohydrate in the diet is more important than the amount. Some sources of carbohydrates, such as vegetables, fruits, whole grains, and beans-are healthier than others.   Finally, stay active  Signed, Thomasene Ripple, DO  09/22/2023 7:48 PM    [facility]

## 2023-09-21 ENCOUNTER — Other Ambulatory Visit (HOSPITAL_COMMUNITY): Payer: Self-pay

## 2023-10-06 ENCOUNTER — Other Ambulatory Visit (HOSPITAL_BASED_OUTPATIENT_CLINIC_OR_DEPARTMENT_OTHER): Payer: Self-pay

## 2023-10-22 IMAGING — DX DG CHEST 2V
2 series · 2 of 2 positions shown · non-contrast
Comparison: Chest radiograph 06/05/2020

CLINICAL DATA: Cough

EXAM:
CHEST - 2 VIEW

[chest pa]
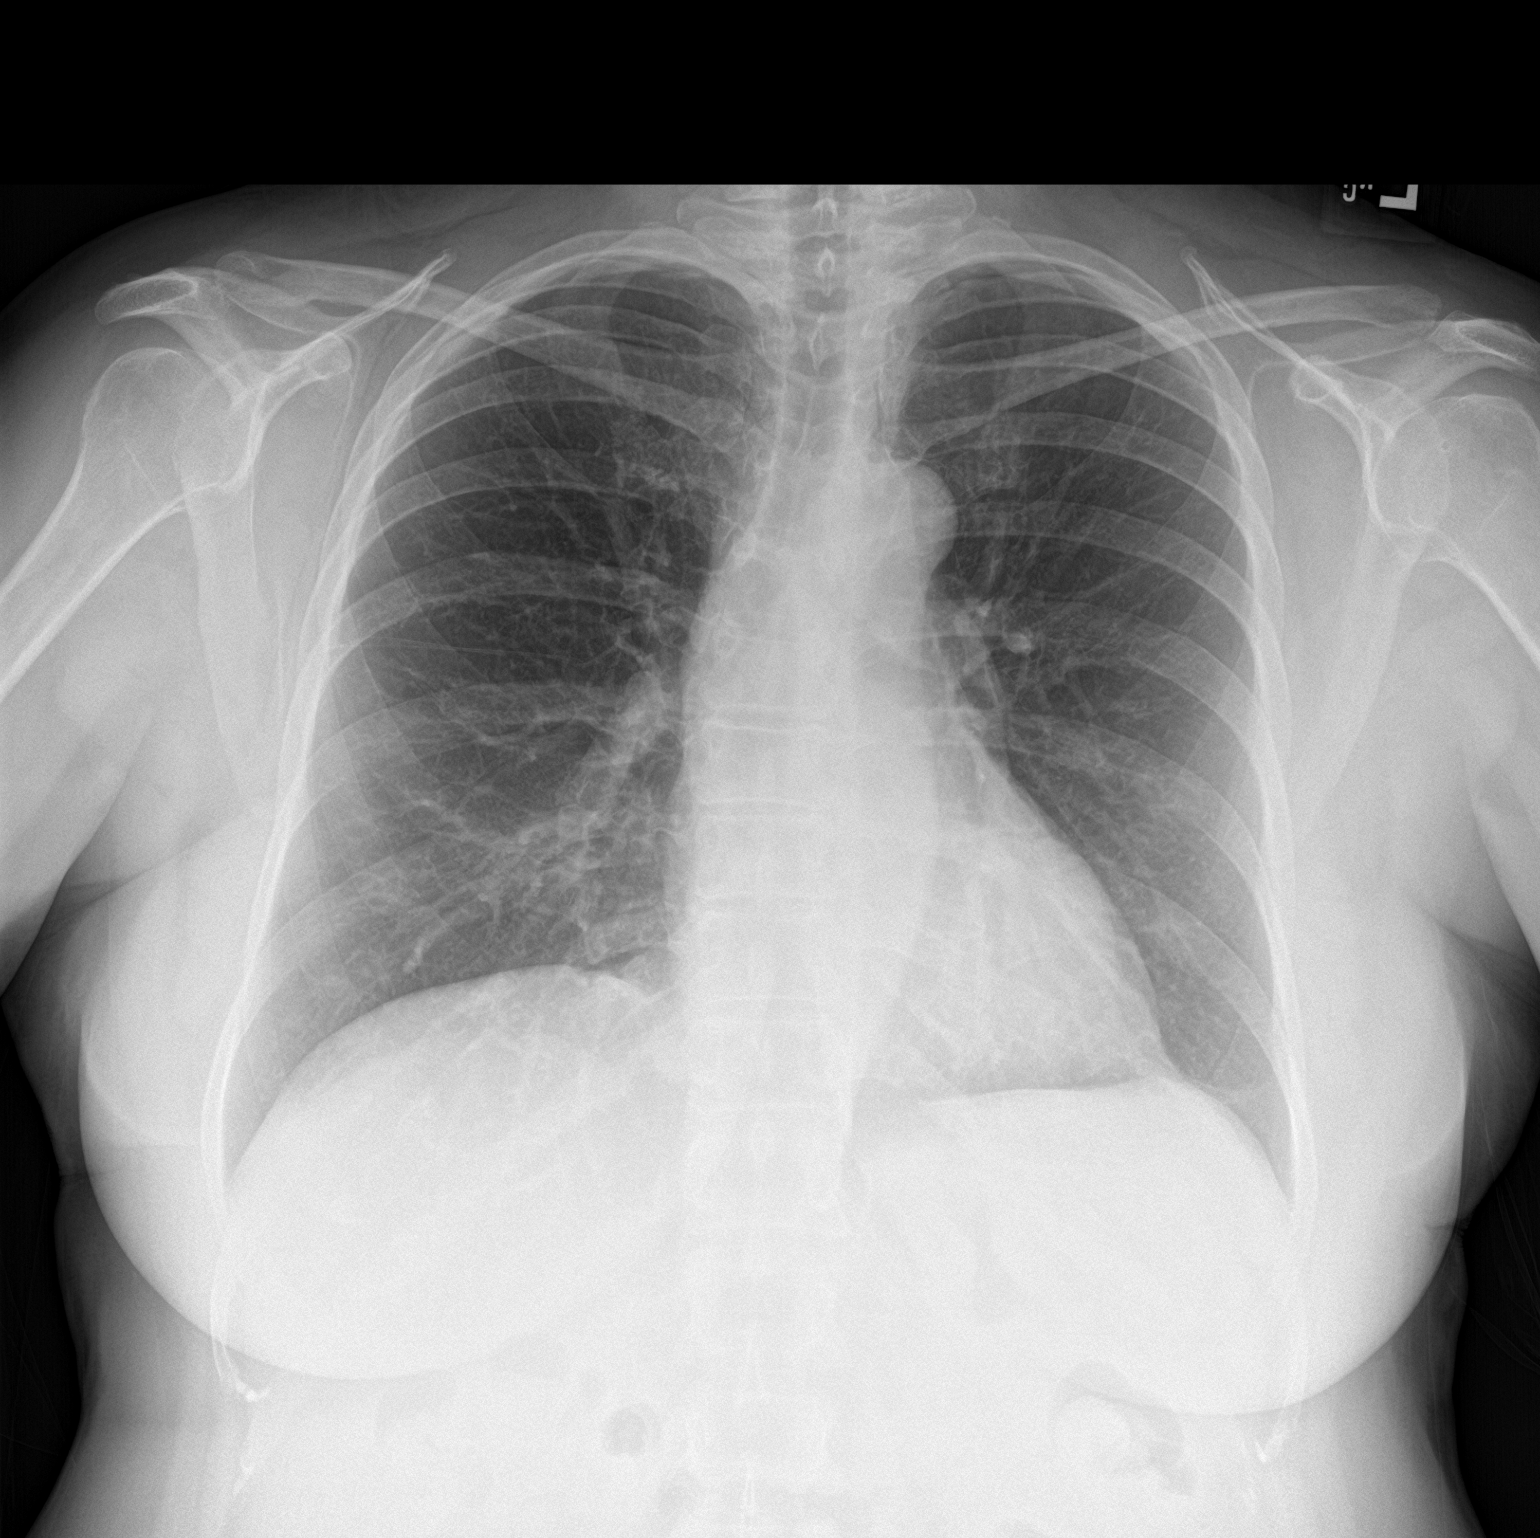

[chest lat]
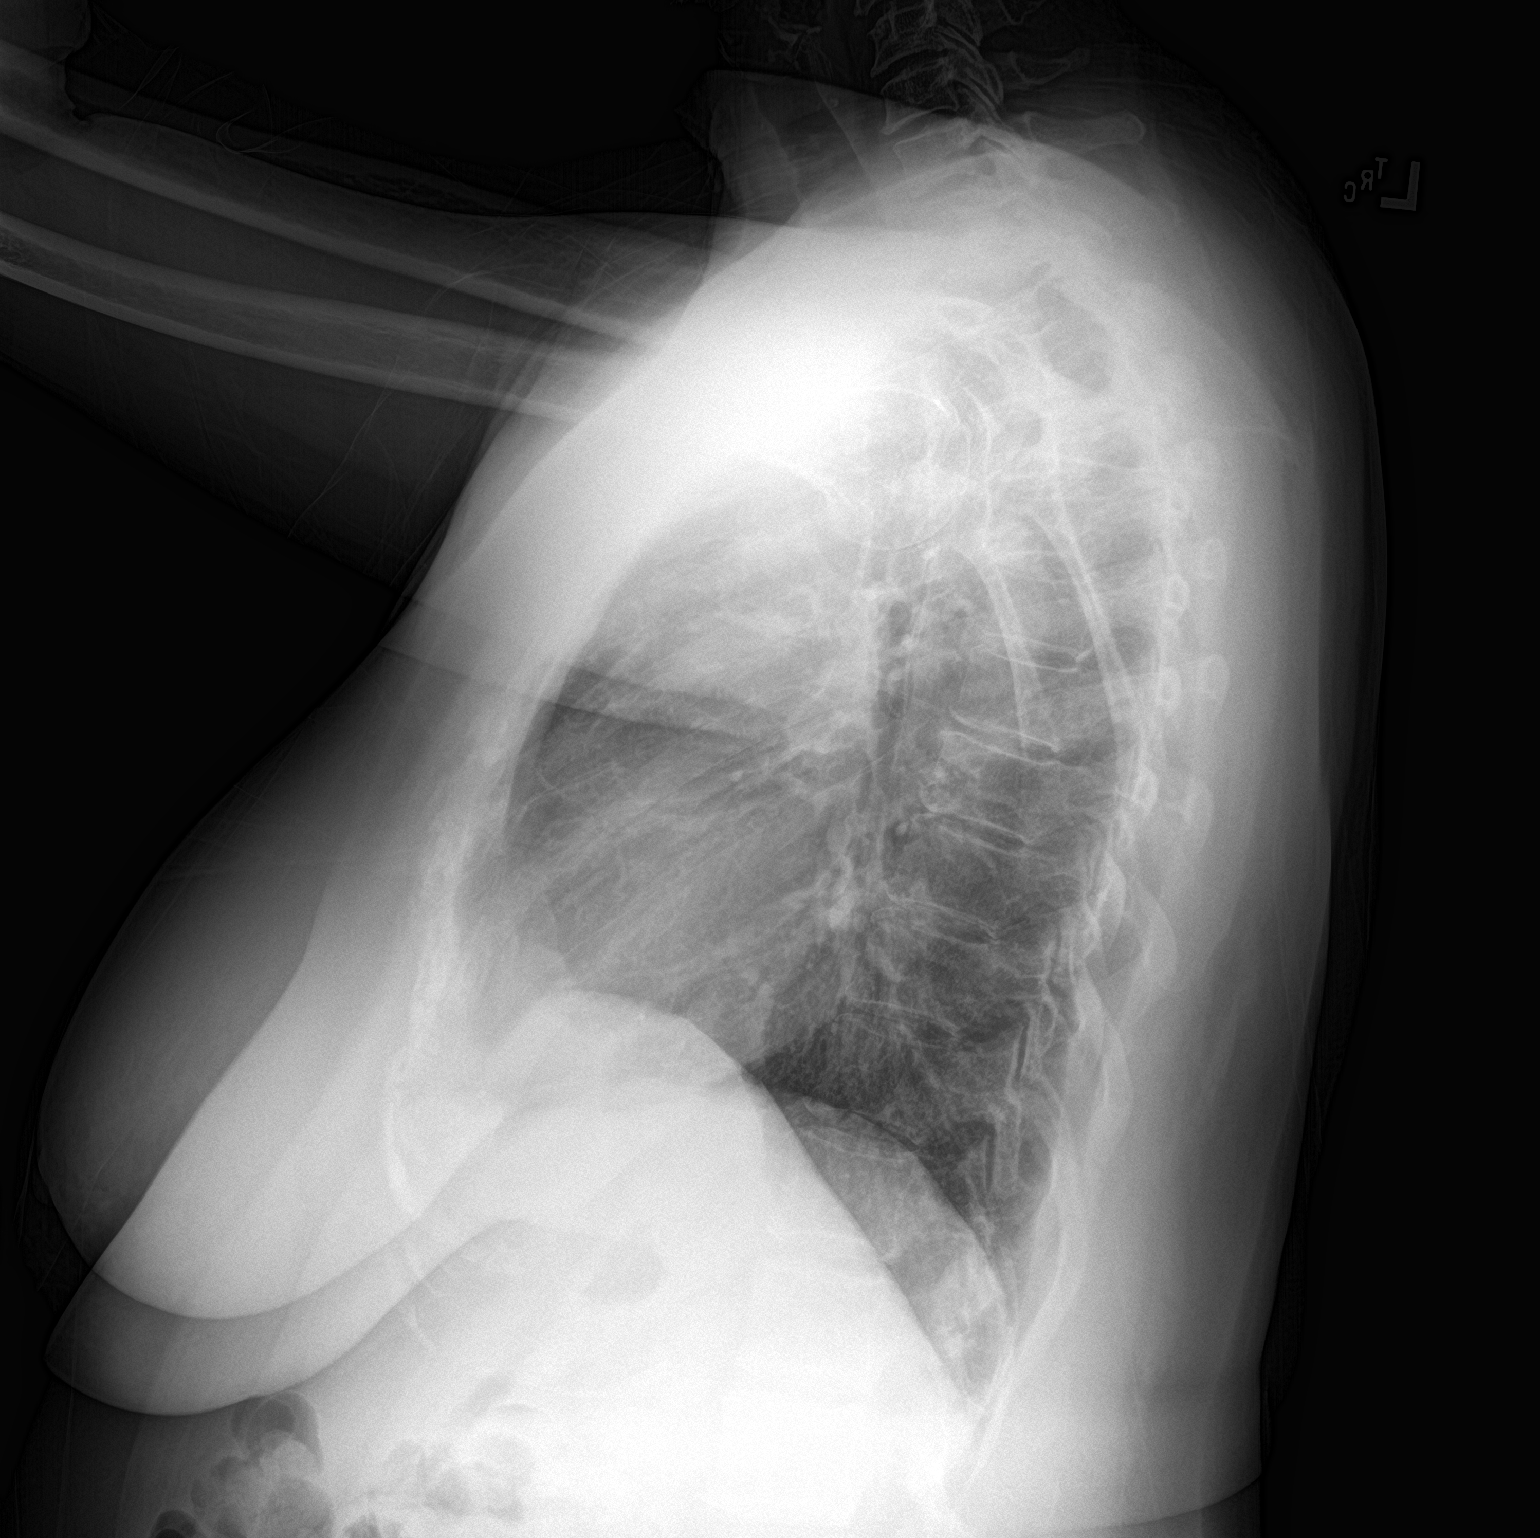

[2 of 2 positions shown; findings below may reference images not displayed]

FINDINGS: The heart size and mediastinal contours are within normal limits.
Both lungs are clear. The visualized skeletal structures are
unremarkable.
IMPRESSION: No active cardiopulmonary disease.

## 2023-11-08 ENCOUNTER — Other Ambulatory Visit (HOSPITAL_BASED_OUTPATIENT_CLINIC_OR_DEPARTMENT_OTHER): Payer: Self-pay

## 2023-11-08 ENCOUNTER — Telehealth: Payer: Self-pay | Admitting: Pharmacy Technician

## 2023-11-08 NOTE — Telephone Encounter (Signed)
 Pharmacy Patient Advocate Encounter   Received notification from CoverMyMeds that prior authorization for WEGOVY  is required/requested.   Insurance verification completed.   The patient is insured through [facility] .   Per test claim: PA required; PA submitted to above mentioned insurance via CoverMyMeds Key/confirmation #/EOC [id number] Status is pending

## 2023-11-09 ENCOUNTER — Other Ambulatory Visit: Payer: Self-pay | Admitting: Family

## 2023-11-09 ENCOUNTER — Other Ambulatory Visit (HOSPITAL_COMMUNITY): Payer: Self-pay

## 2023-11-09 ENCOUNTER — Other Ambulatory Visit (HOSPITAL_BASED_OUTPATIENT_CLINIC_OR_DEPARTMENT_OTHER): Payer: Self-pay

## 2023-11-09 DIAGNOSIS — R059 Cough, unspecified: Secondary | ICD-10-CM

## 2023-11-09 NOTE — Telephone Encounter (Signed)
 Pharmacy Patient Advocate Encounter  Received notification from [facility] that Prior Authorization for wegovy  has been APPROVED from 11/09/23 to 11/08/24. Spoke to pharmacy to process.Copay is $24.98.    PA #/Case ID/Reference #: [id number]

## 2023-11-28 ENCOUNTER — Ambulatory Visit: Admitting: Family

## 2023-11-28 ENCOUNTER — Encounter: Payer: Self-pay | Admitting: Family

## 2023-11-28 VITALS — BP 121/79 | HR 83 | Temp 97.4°F | Ht 62.0 in | Wt 139.0 lb

## 2023-11-28 DIAGNOSIS — K219 Gastro-esophageal reflux disease without esophagitis: Secondary | ICD-10-CM | POA: Diagnosis not present

## 2023-11-28 DIAGNOSIS — G4700 Insomnia, unspecified: Secondary | ICD-10-CM

## 2023-11-28 DIAGNOSIS — E782 Mixed hyperlipidemia: Secondary | ICD-10-CM | POA: Diagnosis not present

## 2023-11-28 DIAGNOSIS — G4733 Obstructive sleep apnea (adult) (pediatric): Secondary | ICD-10-CM | POA: Diagnosis not present

## 2023-11-28 DIAGNOSIS — Z8639 Personal history of other endocrine, nutritional and metabolic disease: Secondary | ICD-10-CM

## 2023-11-28 DIAGNOSIS — F32A Depression, unspecified: Secondary | ICD-10-CM

## 2023-11-28 DIAGNOSIS — R059 Cough, unspecified: Secondary | ICD-10-CM

## 2023-11-28 DIAGNOSIS — E785 Hyperlipidemia, unspecified: Secondary | ICD-10-CM

## 2023-11-28 MED ORDER — OMEPRAZOLE 40 MG PO CPDR: 40.0000 mg | DELAYED_RELEASE_CAPSULE | Freq: Every day | ORAL | 1 refills | Status: DC

## 2023-11-28 MED ORDER — BUPROPION HCL ER (XL) 300 MG PO TB24: 300.0000 mg | ORAL_TABLET | Freq: Every day | ORAL | 1 refills | Status: DC

## 2023-11-28 NOTE — Assessment & Plan Note (Addendum)
 Lab Results  Component Value Date   CHOL 206 (H) 02/24/2023   HDL 62 02/24/2023   LDLCALC 126 (H) 02/24/2023   LDLDIRECT 203.0 11/19/2021   TRIG 100 02/24/2023   CHOLHDL 3.3 02/24/2023   Maintained on statin. Update lipid panel.

## 2023-11-28 NOTE — Patient Instructions (Signed)
 VISIT SUMMARY:  Today, we reviewed your medications and overall wellness. You have successfully managed your weight with Wegovy , and we discussed your sleep apnea, depression, acid reflux, and ganglion cysts.  YOUR PLAN:  OBESITY: Your weight is stable between 135-140 lbs, and you have not reported any side effects from Wegovy . -Continue taking Wegovy  1.7 mg for weight management.  SLEEP APNEA: You have not been using your CPAP machine, and we need to confirm if your sleep apnea has resolved due to weight loss. -You will be referred to a sleep specialist for a home sleep study without CPAP to assess your current status.  DEPRESSION: Your mood is generally stable on Wellbutrin , but you have experienced increased crying episodes due to stress. -Your Wellbutrin  prescription will be refilled. -Please adhere consistently to your medication regimen.  ACID REFLUX: Your acid reflux symptoms may be due to inconsistent medication use and possibly a side effect of Wegovy . -Your omeprazole  prescription will be refilled. -Please take your medication consistently.  GANGLION CYSTS: You have ganglion cysts on your hands causing discomfort, but no surgery is planned unless symptoms worsen. -Consider using topical Penced for symptom relief.

## 2023-11-28 NOTE — Assessment & Plan Note (Signed)
 Weight looks great on Wegovy . She feels that her weight is stable and no longer dropping.  Continue wegovy  with close weight monitoring. I do not feel that she needs to drop any further weight at this time.

## 2023-11-28 NOTE — Progress Notes (Signed)
 Subjective:     Patient ID: Haley Fuentes, female    DOB: 1960/10/12, 63 y.o.   MRN: [id number]  Chief Complaint  Patient presents with   Medical Management of Chronic Issues    Patient presents today for a follow-up   Quality Metric Gaps    HIV screening, pneumococcal    HPI  Discussed the use of AI scribe software for clinical note transcription with the patient, who gave verbal consent to proceed.  History of Present Illness   Haley Fuentes is a 63 year old female who presents for a follow-up on medications and wellness check. She effectively manages her weight with Wegovy , currently weighing 139 pounds, down from a high of 172 pounds over two years. She is satisfied with her progress. She takes atorvastatin  and questions if it causes joint pain in her arms and hands. She has undergone physical therapy for her hands, with worsening symptoms and bumps present. She experiences acid reflux due to inconsistent medication use. She has sleep apnea but is not using her CPAP machine and has not had a follow-up sleep study. Her mood is generally stable on Wellbutrin , though she has been crying more frequently due to personal stressors. She plans to adhere more closely to her medication regimen once refilled. She recently fell at her daughter's house, resulting in a minor knee bruise. She has ganglion cysts on her hands causing discomfort but is not considering surgery. She is under stress from significant changes at work, which has affected her mood. Her grandson brings her joy, and she looks forward to visiting him soon.     Health Maintenance Due  Topic Date Due   HIV Screening  Never done   Pneumococcal Vaccine 65-60 Years old (1 of 2 - PCV) Never done    Past Medical History:  Diagnosis Date   Anemia    hx of iron deficient anemia   Arthritis    Blood transfusion without reported diagnosis    Depression    Gastric polyp    GERD (gastroesophageal reflux disease)    Hemorrhoids     HLD (hyperlipidemia)    Joint pain    left shoulder   Kidney lesion    right   Osteopenia 05/04/2017   Postmenopausal    Sprain of anterior talofibular ligament of left ankle 10/27/2020    Past Surgical History:  Procedure Laterality Date   ENDOMETRIAL ABLATION  2007   Novasure   TUBAL LIGATION  1983    Family History  Problem Relation Age of Onset   Diabetes Mother    Heart disease Mother    Breast cancer Mother 7   Stroke Father    Diabetes Father    Heart disease Father        CABG, AVR, A-Fib   Hepatitis Brother    Alcohol abuse Brother    Raynaud syndrome Sister    Healthy Daughter    Depression Son    Anxiety disorder Son    Healthy Son     Social History   Socioeconomic History   Marital status: Married    Spouse name: Not on file   Number of children: 2   Years of education: Not on file   Highest education level: Not on file  Occupational History   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[employer]    Employer: [employer]  Tobacco Use   Smoking status: Former    Current packs/day: 0.00    Average packs/day: 0.3 packs/day for 4.0 years (1.0  ttl pk-yrs)    Types: Cigarettes    Start date: 07/04/1981    Quit date: 07/04/1985    Years since quitting: 38.4   Smokeless tobacco: Never  Vaping Use   Vaping status: Never Used  Substance and Sexual Activity   Alcohol use: Yes    Alcohol/week: 0.0 standard drinks of alcohol    Comment: 4-5 times a week   Drug use: No   Sexual activity: Not on file  Other Topics Concern   Not on file  Social History Narrative   Regular exercise: 30 minutes 5 days a week   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      Social Drivers of Corporate investment banker Strain: Not on file  Food Insecurity: Not on file  Transportation Needs: Not on file  Physical Activity: Not on file  Stress:  Not on file  Social Connections: Not on file  Intimate Partner Violence: Not on file    Outpatient Medications Prior to Visit  Medication Sig Dispense Refill   ASPIRIN  LOW DOSE 81 MG tablet TAKE 1 TABLET BY MOUTH DAILY ** SWALLOW TABLET WHOLE** 90 tablet 3   cyclobenzaprine  (FLEXERIL ) 10 MG tablet TAKE 1 TABLET BY MOUTH TWO TIMES A DAY AS NEEDED FOR MUSCLE SPASMS 180 tablet 0   ESTRACE VAGINAL 0.1 MG/GM vaginal cream Place 1 application vaginally at bedtime.  1   estradiol (ESTRACE) 0.5 MG tablet Take 0.5 mg by mouth daily.     ezetimibe  (ZETIA ) 10 MG tablet TAKE 1 TABLET BY MOUTH DAILY 90 tablet 3   medroxyPROGESTERone  (PROVERA ) 2.5 MG tablet Take 1 tablet (2.5 mg total) by mouth daily.     Multiple Vitamins-Minerals (MULTIVITAMIN WITH MINERALS) tablet 1 tablet.     ranolazine  (RANEXA ) 500 MG 12 hr tablet Take 1 tablet (500 mg total) by mouth 2 (two) times daily. 180 tablet 3   Semaglutide -Weight Management (WEGOVY ) 1.7 MG/0.75ML SOAJ Inject 1.7 mg into the skin once a week. 3 mL 6   zolpidem  (AMBIEN ) 10 MG tablet TAKE ONE HALF TO ONE TABLET BY MOUTH EVERY NIGHT AT BEDTIME AS NEEDED FOR SLEEP 15 tablet 0   buPROPion  (WELLBUTRIN  XL) 300 MG 24 hr tablet Take 1 tablet (300 mg total) by mouth daily. 30 tablet 0   omeprazole  (PRILOSEC) 40 MG capsule Take 1 capsule (40 mg total) by mouth daily. 30 capsule 0   atorvastatin  (LIPITOR) 80 MG tablet Take 1 tablet (80 mg total) by mouth daily. 90 tablet 3   methylPREDNISolone  (MEDROL  DOSEPAK) 4 MG TBPK tablet Take as instructed in the kit 21 tablet 0   No facility-administered medications prior to visit.    Allergies  Allergen Reactions   Hydrocodone -Acetaminophen      "made her skin crawl"   Omni-Pac     vomitting   Voltaren  [Diclofenac ]     diarrhea    ROS    See HPI  Objective:     Physical Exam Constitutional:      General: She is not in acute distress.    Appearance: Normal appearance. She is well-developed.  HENT:     Head:  Normocephalic and atraumatic.     Right Ear: External ear normal.     Left Ear: External ear normal.  Eyes:     General: No scleral icterus. Neck:     Thyroid :  No thyromegaly.  Cardiovascular:     Rate and Rhythm: Normal rate and regular rhythm.     Heart sounds: Normal heart sounds. No murmur heard. Pulmonary:     Effort: Pulmonary effort is normal. No respiratory distress.     Breath sounds: Normal breath sounds. No wheezing.  Musculoskeletal:     Cervical back: Neck supple.     Comments: Small ganglion cyst noted right dorsal wrist and left palm on tendon  Skin:    General: Skin is warm and dry.  Neurological:     Mental Status: She is alert and oriented to person, place, and time.  Psychiatric:        Mood and Affect: Mood normal.        Behavior: Behavior normal.        Thought Content: Thought content normal.        Judgment: Judgment normal.      BP 121/79   Pulse 83   Temp (!) 97.4 F (36.3 C)   Ht 5\' 2"  (1.575 m)   Wt 139 lb (63 kg)   LMP 07/04/2004   SpO2 100%   BMI 25.42 kg/m  Wt Readings from Last 3 Encounters:  11/28/23 139 lb (63 kg)  09/18/23 143 lb 6.4 oz (65 kg)  07/17/23 150 lb (68 kg)       Assessment & Plan:   Problem List Items Addressed This Visit       Unprioritized   OSA (obstructive sleep apnea)    Sleep apnea previously managed with CPAP, discontinued due to weight loss. Potential resolution of sleep apnea due to weight loss. Discussed need for home sleep study to confirm resolution before retiring formally doscontinuing CPAP.  - Refer to sleep specialist for home sleep study without CPAP to assess current status of sleep apnea.       Relevant Orders   Ambulatory referral to Pulmonology   Mixed hyperlipidemia   Lab Results  Component Value Date   CHOL 206 (H) 02/24/2023   HDL 62 02/24/2023   LDLCALC 126 (H) 02/24/2023   LDLDIRECT 203.0 11/19/2021   TRIG 100 02/24/2023   CHOLHDL 3.3 02/24/2023   Maintained on statin.  Update lipid panel.       INSOMNIA, CHRONIC   Stable with rare use of ambien .  Updated contract.      Relevant Orders   DRUG MONITORING, PANEL 8 WITH CONFIRMATION, URINE   Hyperlipidemia - Primary   Update lipid panel, continue atorvastatin .       Relevant Orders   Lipid panel   History of obesity   Weight looks great on Wegovy . She feels that her weight is stable and no longer dropping.  Continue wegovy  with close weight monitoring. I do not feel that she needs to drop any further weight at this time.       GERD (gastroesophageal reflux disease)   Uncontrolled.  Continues omeprazole  but not consistent. Recommend consistent use.       Relevant Medications   omeprazole  (PRILOSEC) 40 MG capsule   Depression   Mood is stabe on wellbutrin .       Relevant Medications   buPROPion  (WELLBUTRIN  XL) 300 MG 24 hr tablet   Other Visit Diagnoses       Cough, unspecified type       Relevant Medications   omeprazole  (PRILOSEC) 40 MG capsule       I have discontinued Learta Prost methylPREDNISolone . I am also having her maintain her estradiol, multivitamin with  minerals, ESTRACE VAGINAL, medroxyPROGESTERone , zolpidem , cyclobenzaprine , atorvastatin , ezetimibe , ranolazine , Aspirin  Low Dose, Wegovy , omeprazole , and buPROPion .  Meds ordered this encounter  Medications   omeprazole  (PRILOSEC) 40 MG capsule    Sig: Take 1 capsule (40 mg total) by mouth daily.    Dispense:  90 capsule    Refill:  1    Requested drug refills are authorized, however, the patient needs further evaluation and/or laboratory testing before further refills are given. Ask her to make an appointment for this.    Supervising Provider:   Randie Bustle A [[id number]]   buPROPion  (WELLBUTRIN  XL) 300 MG 24 hr tablet    Sig: Take 1 tablet (300 mg total) by mouth daily.    Dispense:  90 tablet    Refill:  1    Requested drug refills are authorized, however, the patient needs further evaluation and/or laboratory testing  before further refills are given. Ask her to make an appointment for this.    Supervising Provider:   Randie Bustle A [phone]

## 2023-11-28 NOTE — Assessment & Plan Note (Signed)
 Stable with rare use of ambien .  Updated contract.

## 2023-11-28 NOTE — Assessment & Plan Note (Signed)
 Mood is stabe on wellbutrin .

## 2023-11-28 NOTE — Assessment & Plan Note (Signed)
  Sleep apnea previously managed with CPAP, discontinued due to weight loss. Potential resolution of sleep apnea due to weight loss. Discussed need for home sleep study to confirm resolution before retiring formally doscontinuing CPAP.  - Refer to sleep specialist for home sleep study without CPAP to assess current status of sleep apnea.

## 2023-11-28 NOTE — Assessment & Plan Note (Signed)
 Uncontrolled.  Continues omeprazole  but not consistent. Recommend consistent use.

## 2023-11-28 NOTE — Assessment & Plan Note (Signed)
 Update lipid panel, continue atorvastatin.

## 2023-11-29 ENCOUNTER — Other Ambulatory Visit (INDEPENDENT_AMBULATORY_CARE_PROVIDER_SITE_OTHER)

## 2023-11-29 DIAGNOSIS — E785 Hyperlipidemia, unspecified: Secondary | ICD-10-CM | POA: Diagnosis not present

## 2023-11-29 LAB — LIPID PANEL
Cholesterol: 221 mg/dL — ABNORMAL HIGH (ref 0–200)
HDL: 60.1 mg/dL (ref >39.00)
LDL Cholesterol: 115 mg/dL — ABNORMAL HIGH (ref 0–99)
NonHDL: 161.09
Total CHOL/HDL Ratio: 4
Triglycerides: 230 mg/dL — ABNORMAL HIGH (ref 0.0–149.0)
VLDL: 46 mg/dL — ABNORMAL HIGH (ref 0.0–40.0)

## 2023-11-30 ENCOUNTER — Ambulatory Visit: Payer: Self-pay | Admitting: Family

## 2023-11-30 LAB — DRUG MONITORING, PANEL 8 WITH CONFIRMATION, URINE
6 Acetylmorphine: NEGATIVE ng/mL (ref <10)
Alcohol Metabolites: POSITIVE ng/mL — AB (ref <500)
Amphetamines: NEGATIVE ng/mL (ref <500)
Benzodiazepines: NEGATIVE ng/mL (ref <100)
Buprenorphine, Urine: NEGATIVE ng/mL (ref <5)
Cocaine Metabolite: NEGATIVE ng/mL (ref <150)
Creatinine: 102.3 mg/dL (ref >=20.0)
Ethyl Glucuronide (ETG): 39525 ng/mL — ABNORMAL HIGH (ref <500)
Ethyl Sulfate (ETS): 3376 ng/mL — ABNORMAL HIGH (ref <100)
MDMA: NEGATIVE ng/mL (ref <500)
Marijuana Metabolite: NEGATIVE ng/mL (ref <20)
Opiates: NEGATIVE ng/mL (ref <100)
Oxidant: NEGATIVE ug/mL (ref <200)
Oxycodone: NEGATIVE ng/mL (ref <100)
pH: 6 (ref 4.5–9.0)

## 2023-11-30 LAB — DM TEMPLATE

## 2024-01-04 ENCOUNTER — Other Ambulatory Visit (HOSPITAL_BASED_OUTPATIENT_CLINIC_OR_DEPARTMENT_OTHER): Payer: Self-pay

## 2024-01-29 ENCOUNTER — Encounter: Payer: Self-pay | Admitting: Family

## 2024-02-16 ENCOUNTER — Ambulatory Visit

## 2024-02-16 ENCOUNTER — Ambulatory Visit (INDEPENDENT_AMBULATORY_CARE_PROVIDER_SITE_OTHER)

## 2024-02-16 ENCOUNTER — Encounter: Payer: Self-pay | Admitting: Cardiology

## 2024-02-16 VITALS — BP 130/82 | HR 85 | Temp 97.9°F | Ht 62.0 in | Wt 138.4 lb

## 2024-02-16 DIAGNOSIS — G4721 Circadian rhythm sleep disorder, delayed sleep phase type: Secondary | ICD-10-CM | POA: Diagnosis not present

## 2024-02-16 DIAGNOSIS — G4733 Obstructive sleep apnea (adult) (pediatric): Secondary | ICD-10-CM | POA: Diagnosis not present

## 2024-02-16 DIAGNOSIS — F32A Depression, unspecified: Secondary | ICD-10-CM | POA: Diagnosis not present

## 2024-02-16 DIAGNOSIS — G4700 Insomnia, unspecified: Secondary | ICD-10-CM | POA: Diagnosis not present

## 2024-02-16 NOTE — Assessment & Plan Note (Signed)
 Severe sleep apnea: Likely still has sleep apnea based on symptoms.  We will get repeat sleep study because of significant weight loss.  If still has sleep apnea we will resume CPAP

## 2024-02-16 NOTE — Assessment & Plan Note (Signed)
 Continue Wellbutrin .

## 2024-02-16 NOTE — Progress Notes (Signed)
 New Patient Pulmonology Office Visit   Subjective:  Patient ID: Haley Fuentes, female    DOB: 1960-12-01  MRN: [id number]  Referred by: Daryl Setter, NP  CC:  Chief Complaint  Patient presents with   Consult    Sleep Stopped using CPAP in 05/2022    HPISusan Fuentes is a 63 y.o. female with hyperlipidemia, hypertension, nonobstructive CAD, OSA.  Presents for evaluation of sleep apnea and see if her sleep apnea is better after weight loss.   Was started on wegovy  last year. Lost 35 lbs.   Dx w osa in 2023. Used CPAP for 1 year. Was able to tolerate. DME was [facility]. Last use in 2024. Wants to make sure if she still needs CPAP prior to going back.  Takes ambien  5mg  very rarely. 15 mg melatonin everynight around 9-10 pm.   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   Mouth breather: no Preferred sleeping position: all over.   Sleep related Symptoms:  Snoring- yes Witnessed apnea- no Gasping/choking- no morning HA/dry mouth- yes tired on awakening, excessive daytime sleepiness- yes.  Restless legs- no. But get cramps only when going in bed. Cramps once a week.  No sleep walk/talk/nightmares.   Sleep routine:  -Bed: 9-10 p. Takes 30-60 min.  -Nocturnal awakenings: twice. May take 30-60 min to fall back to sleep. -Wake: 6-7 am.  -Napping:no -perceived sleep time: 6-7 hrs.  -sleep hygiene: listens to music. Watch TV in bed. May binge on TV series.   Habits: -Caffeine: morning 3-4 cups.  -Alcohol: few times a week after dinner.  -Nicotine:no -Recreational drugs: no   PRIOR TESTS and IMAGING: PSG/HSAT:  HST January 2023: AHI 32/h.  Desaturation for 6 minutes nadir of 80.  Sleep was predominantly supine.  No central apneas.  ECHO: Jan 2023: EF normal grade 1 diastolic dysfunction.  MRI Brain: no     02/16/2024   10:00 AM  Results of the Epworth flowsheet  Sitting and reading 0  Watching TV 2  Sitting, inactive in a public place (e.g. a theatre or a meeting) 0  As a  passenger in a car for an hour without a break 0  Lying down to rest in the afternoon when circumstances permit 2  Sitting and talking to someone 0  Sitting quietly after a lunch without alcohol 0  In a car, while stopped for a few minutes in traffic 0  Total score 4    Allergies: Hydrocodone -acetaminophen , Omni-pac, and Voltaren  [diclofenac ]  Current Outpatient Medications:    ASPIRIN  LOW DOSE 81 MG tablet, TAKE 1 TABLET BY MOUTH DAILY ** SWALLOW TABLET WHOLE**, Disp: 90 tablet, Rfl: 3   atorvastatin  (LIPITOR) 80 MG tablet, Take 1 tablet (80 mg total) by mouth daily., Disp: 90 tablet, Rfl: 3   buPROPion  (WELLBUTRIN  XL) 300 MG 24 hr tablet, Take 1 tablet (300 mg total) by mouth daily., Disp: 90 tablet, Rfl: 1   cyclobenzaprine  (FLEXERIL ) 10 MG tablet, TAKE 1 TABLET BY MOUTH TWO TIMES A DAY AS NEEDED FOR MUSCLE SPASMS, Disp: 180 tablet, Rfl: 0   ESTRACE VAGINAL 0.1 MG/GM vaginal cream, Place 1 application vaginally at bedtime., Disp: , Rfl: 1   estradiol (ESTRACE) 0.5 MG tablet, Take 0.5 mg by mouth daily., Disp: , Rfl:    ezetimibe  (ZETIA ) 10 MG tablet, TAKE 1 TABLET BY MOUTH DAILY, Disp: 90 tablet, Rfl: 3   medroxyPROGESTERone  (PROVERA ) 2.5 MG tablet, Take 1 tablet (2.5 mg total) by mouth daily., Disp: , Rfl:  Multiple Vitamins-Minerals (MULTIVITAMIN WITH MINERALS) tablet, 1 tablet., Disp: , Rfl:    omeprazole  (PRILOSEC) 40 MG capsule, Take 1 capsule (40 mg total) by mouth daily., Disp: 90 capsule, Rfl: 1   ranolazine  (RANEXA ) 500 MG 12 hr tablet, Take 1 tablet (500 mg total) by mouth 2 (two) times daily., Disp: 180 tablet, Rfl: 3   Semaglutide -Weight Management (WEGOVY ) 1.7 MG/0.75ML SOAJ, Inject 1.7 mg into the skin once a week., Disp: 3 mL, Rfl: 6   zolpidem  (AMBIEN ) 10 MG tablet, Take 5 mg by mouth at bedtime as needed for sleep., Disp: , Rfl:  Past Medical History:  Diagnosis Date   Anemia    hx of iron deficient anemia   Arthritis    Blood transfusion without reported diagnosis     Depression    Gastric polyp    GERD (gastroesophageal reflux disease)    Hemorrhoids    HLD (hyperlipidemia)    Joint pain    left shoulder   Kidney lesion    right   Osteopenia 05/04/2017   Postmenopausal    Sprain of anterior talofibular ligament of left ankle 10/27/2020   Past Surgical History:  Procedure Laterality Date   ENDOMETRIAL ABLATION  2007   Novasure   TUBAL LIGATION  1983   Family History  Problem Relation Age of Onset   Diabetes Mother    Heart disease Mother    Breast cancer Mother 68   Stroke Father    Diabetes Father    Heart disease Father        CABG, AVR, A-Fib   Hepatitis Brother    Alcohol abuse Brother    Raynaud syndrome Sister    Healthy Daughter    Depression Son    Anxiety disorder Son    Healthy Son    Social History   Socioeconomic History   Marital status: Married    Spouse name: Not on file   Number of children: 2   Years of education: Not on file   Highest education level: Not on file  Occupational History   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    Employer: [employer]  Tobacco Use   Smoking status: Former    Current packs/day: 0.00    Average packs/day: 0.3 packs/day for 4.0 years (1.0 ttl pk-yrs)    Types: Cigarettes    Start date: 07/04/1981    Quit date: 07/04/1985    Years since quitting: 38.6   Smokeless tobacco: Never  Vaping Use   Vaping status: Never Used  Substance and Sexual Activity   Alcohol use: Yes    Alcohol/week: 0.0 standard drinks of alcohol    Comment: 4-5 times a week   Drug use: No   Sexual activity: Not on file  Other Topics Concern   Not on file  Social History Narrative   Regular exercise: 30 minutes 5 days a week   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      Social Drivers of Corporate investment banker Strain: Not on file  Food Insecurity: Not  on file  Transportation Needs: Not on file  Physical Activity: Not on file  Stress: Not on file  Social Connections: Not on file  Intimate Partner Violence: Not on file       Objective:  BP 130/82   Pulse 85   Temp 97.9 F (  36.6 C)   Ht 5' 2 (1.575 m)   Wt 138 lb 6.4 oz (62.8 kg)   LMP 07/04/2004   SpO2 100% Comment: RA  BMI 25.31 kg/m  BMI Readings from Last 3 Encounters:  02/16/24 25.31 kg/m  11/28/23 25.42 kg/m  09/18/23 26.23 kg/m    Physical Exam: CONSTITUTIONAL: NAD, well-appearing NASAL/OROPHARYNX:  Normal mucosa. No septal deviation. No hypertrophy of inferior turbinates. Modified Mallampati score 1. Tonsils not enlarged.  CV: RRR s1s2 nl, no murmurs  RESP: Clear to auscultation, normal respiratory effort   NEURO: CN II/XII grossly intact PSYCH: Alert & oriented x 3, Euthymic, appropriate affect MUSCULOSKELETAL:  Normal gait   Diagnostic Review:  Last metabolic panel Lab Results  Component Value Date   GLUCOSE 82 08/02/2023   NA 142 08/02/2023   K 4.5 08/02/2023   CL 106 08/02/2023   CO2 30 08/02/2023   BUN 9 08/02/2023   CREATININE 0.69 08/02/2023   GFR 92.94 08/02/2023   CALCIUM  9.5 08/02/2023   PROT 7.1 03/15/2023   ALBUMIN 4.6 03/15/2023   LABGLOB 2.5 03/15/2023   BILITOT 0.7 03/15/2023   ALKPHOS 88 03/15/2023   AST 19 03/15/2023   ALT 26 03/15/2023         Assessment & Plan:   Assessment & Plan OSA (obstructive sleep apnea) Severe sleep apnea: Likely still has sleep apnea based on symptoms.  We will get repeat sleep study because of significant weight loss.  If still has sleep apnea we will resume CPAP Insomnia, unspecified type Related to sleep hygiene.  Spends a lot of time in plate.  Sleeps zone restriction.  Sleeps zone from 12:30 AM to 6:30 AM.  Once sleep consolidated and still feels sleepy can widen sleep zone.  Fixed wake-up time at 6:30 AM.  Continue taking melatonin at 9 PM.  Can consider reducing the dose to 5 mg.  Avoid TV  prior to bedtime. Depression, unspecified depression type Continue Wellbutrin . Delayed sleep phase syndrome Does not bother her.   Orders Placed This Encounter  Procedures   Home sleep test    He/She was counselled about not driving while drowsy which is common side effect of sleep related disorders.   Return for 1 month After Sleep study.   Ciearra Rufo, MD

## 2024-02-16 NOTE — Patient Instructions (Signed)
 OSA: we will get home sleep study.   Insomnia. Sleep zone from MN to 6.30 am. Sleep hygiene discussed.

## 2024-03-23 ENCOUNTER — Other Ambulatory Visit: Payer: Self-pay | Admitting: Cardiology

## 2024-03-30 ENCOUNTER — Other Ambulatory Visit: Payer: Self-pay | Admitting: Cardiology

## 2024-04-04 ENCOUNTER — Other Ambulatory Visit: Payer: Self-pay | Admitting: Cardiology

## 2024-04-04 MED ORDER — EZETIMIBE 10 MG PO TABS: 10.0000 mg | ORAL_TABLET | Freq: Every day | ORAL | 3 refills | Status: AC

## 2024-04-17 ENCOUNTER — Ambulatory Visit: Admitting: Family

## 2024-04-17 ENCOUNTER — Telehealth: Payer: Self-pay | Admitting: Family

## 2024-04-17 VITALS — BP 107/72 | HR 71 | Temp 97.6°F | Resp 16 | Ht 62.0 in | Wt 136.2 lb

## 2024-04-17 DIAGNOSIS — G4733 Obstructive sleep apnea (adult) (pediatric): Secondary | ICD-10-CM

## 2024-04-17 DIAGNOSIS — Z0000 Encounter for general adult medical examination without abnormal findings: Secondary | ICD-10-CM

## 2024-04-17 DIAGNOSIS — R0789 Other chest pain: Secondary | ICD-10-CM | POA: Diagnosis not present

## 2024-04-17 DIAGNOSIS — M8589 Other specified disorders of bone density and structure, multiple sites: Secondary | ICD-10-CM

## 2024-04-17 DIAGNOSIS — K219 Gastro-esophageal reflux disease without esophagitis: Secondary | ICD-10-CM

## 2024-04-17 DIAGNOSIS — F32A Depression, unspecified: Secondary | ICD-10-CM

## 2024-04-17 DIAGNOSIS — I10 Essential (primary) hypertension: Secondary | ICD-10-CM

## 2024-04-17 DIAGNOSIS — Z23 Encounter for immunization: Secondary | ICD-10-CM | POA: Diagnosis not present

## 2024-04-17 DIAGNOSIS — E782 Mixed hyperlipidemia: Secondary | ICD-10-CM

## 2024-04-17 DIAGNOSIS — R066 Hiccough: Secondary | ICD-10-CM

## 2024-04-17 LAB — COMPREHENSIVE METABOLIC PANEL WITH GFR
ALT: 24 U/L (ref 0–35)
AST: 18 U/L (ref 0–37)
Albumin: 4.5 g/dL (ref 3.5–5.2)
Alkaline Phosphatase: 57 U/L (ref 39–117)
BUN: 13 mg/dL (ref 6–23)
CO2: 28 meq/L (ref 19–32)
Calcium: 9 mg/dL (ref 8.4–10.5)
Chloride: 106 meq/L (ref 96–112)
Creatinine, Ser: 0.7 mg/dL (ref 0.40–1.20)
GFR: 92.16 mL/min (ref >60.00)
Glucose, Bld: 86 mg/dL (ref 70–99)
Potassium: 4.1 meq/L (ref 3.5–5.1)
Sodium: 142 meq/L (ref 135–145)
Total Bilirubin: 0.8 mg/dL (ref 0.2–1.2)
Total Protein: 6.4 g/dL (ref 6.0–8.3)

## 2024-04-17 LAB — LIPID PANEL
Cholesterol: 148 mg/dL (ref 0–200)
HDL: 56.4 mg/dL (ref >39.00)
LDL Cholesterol: 69 mg/dL (ref 0–99)
NonHDL: 91.12
Total CHOL/HDL Ratio: 3
Triglycerides: 109 mg/dL (ref 0.0–149.0)
VLDL: 21.8 mg/dL (ref 0.0–40.0)

## 2024-04-17 LAB — VITAMIN D 25 HYDROXY (VIT D DEFICIENCY, FRACTURES): VITD: 17.69 ng/mL — ABNORMAL LOW (ref 30.00–100.00)

## 2024-04-17 MED ORDER — ESCITALOPRAM OXALATE 10 MG PO TABS: 10.0000 mg | ORAL_TABLET | Freq: Every day | ORAL | 0 refills | Status: DC

## 2024-04-17 MED ORDER — PANTOPRAZOLE SODIUM 40 MG PO TBEC: 40.0000 mg | DELAYED_RELEASE_TABLET | Freq: Every day | ORAL | 1 refills | Status: AC

## 2024-04-17 MED ORDER — BACLOFEN 5 MG PO TABS: 5.0000 mg | ORAL_TABLET | Freq: Three times a day (TID) | ORAL | 2 refills | Status: AC | PRN

## 2024-04-17 NOTE — Assessment & Plan Note (Signed)
 Has a machine and plans to restart.

## 2024-04-17 NOTE — Assessment & Plan Note (Signed)
  Routine adult wellness visit. Blood pressure well-controlled at 107/72 mmHg. No current medication for hypertension. - Perform physical examination. - Review current medications. - Discuss general health and wellness. - Administer flu vaccine. - Administer Prevnar vaccine. - Obtain copy of last mammogram from GYN -Colo/Pap up to date - Check coverage for bone density test and schedule if covered.

## 2024-04-17 NOTE — Assessment & Plan Note (Signed)
 She will check coverage for dexa and let me know.

## 2024-04-17 NOTE — Telephone Encounter (Signed)
 Please call [facility] to request mammogram.

## 2024-04-17 NOTE — Assessment & Plan Note (Signed)
 Lab Results  Component Value Date   CHOL 221 (H) 11/29/2023   HDL 60.10 11/29/2023   LDLCALC 115 (H) 11/29/2023   LDLDIRECT 203.0 11/19/2021   TRIG 230.0 (H) 11/29/2023   CHOLHDL 4 11/29/2023   On lipitor and zetia . Update lipids today.

## 2024-04-17 NOTE — Assessment & Plan Note (Signed)
 Uncontrolled on omeprazole .  Will d/c and change to pantoprazole .

## 2024-04-17 NOTE — Patient Instructions (Signed)
 VISIT SUMMARY:  Today, you had your annual physical exam. We discussed your high cholesterol, persistent hiccups, sleep apnea, depression, and recent UTIs. We also reviewed your current medications and administered flu and Prevnar vaccines.  YOUR PLAN:  GASTROESOPHAGEAL REFLUX DISEASE WITH ESOPHAGEAL SPASM: You have chronic acid reflux with possible esophageal spasms causing chest discomfort and hiccups. -Stop taking omeprazole  and start taking pantoprazole . -Make dietary changes to reduce acid reflux. -We will do an EKG to check your heart due to the chest discomfort. -Take baclofen as needed for esophageal spasms, but be aware it may cause sleepiness.  DEPRESSION: You are experiencing depression, especially after the loss of your pets, and are currently taking Wellbutrin . -Start taking Lexapro, beginning with half a tablet at bedtime for one week, then increase to a full tablet. -Continue taking Wellbutrin  300 mg daily.  OBSTRUCTIVE SLEEP APNEA AND INSOMNIA: You have sleep apnea and have been experiencing difficulty sleeping and daytime fatigue. -Resume using your CPAP machine. -We will have a virtual follow-up in one month to see how you are doing with the medication changes.  HYPERLIPIDEMIA: Your cholesterol level was 221 mg/dL in May, and you are taking Lipitor and Zetia . -We will recheck your cholesterol levels with a lipid panel.  URINARY TRACT INFECTION, RECURRENT: You have had recurrent urinary tract infections recently. -If you have symptoms of a UTI again please let us  know and we will do a urine culture.  ADULT WELLNESS VISIT: This was your routine annual check-up. -Your blood pressure is well-controlled at 107/72 mmHg. -We reviewed your current medications. -We discussed general health and wellness. -You received the flu vaccine and Prevnar vaccine today. -We will schedule your mammogram for November. - Please check if your insurance covers a bone density test and  schedule it if it is covered.

## 2024-04-17 NOTE — Assessment & Plan Note (Signed)
 Not on antihypertensive. BP normal.

## 2024-04-17 NOTE — Progress Notes (Signed)
 Subjective:     Patient ID: Haley Fuentes, female    DOB: 31-Aug-1960, 63 y.o.   MRN: [id number]  Chief Complaint  Patient presents with   Annual Exam    HPI  Discussed the use of AI scribe software for clinical note transcription with the patient, who gave verbal consent to proceed.  History of Present Illness  Haley Fuentes is a 63 year old female who presents for an annual physical exam.  She has high cholesterol, with a recent level of 221 mg/dL, and is on Lipitor and Zetia . She experiences persistent hiccups or burps with a 'pterodactyl' sound after meals, associated with chest tightness and shortness of breath, especially during exertion. Omeprazole  may no longer be effective. Chest pain occurs during stress.  She has sleep apnea and previously used a CPAP machine but has not completed a recent sleep study. She experiences fatigue and daytime sleepiness, with an energy crash around 2-3 PM.  She takes Wellbutrin  300 mg daily for mood but feels depressed, particularly after losing two dogs earlier this year. Prozac  was previously ineffective.  She had recent UTIs managed with antibiotics through telehealth consultations.  Patient presents today for complete physical.  Immunizations: due for prevnar/flu Diet: has lost considerable amount of weight with wegovy . Colonoscopy: up to date Dexa: due Pap Smear: up to date Mammogram: up to date- request from GYN.     Health Maintenance Due  Topic Date Due   HIV Screening  Never done   Pneumococcal Vaccine: 50+ Years (1 of 2 - PCV) Never done   Influenza Vaccine  02/02/2024   COVID-19 Vaccine (4 - 2025-26 season) 03/04/2024   Mammogram  05/20/2024    Past Medical History:  Diagnosis Date   Anemia    hx of iron deficient anemia   Arthritis    Blood transfusion without reported diagnosis    Depression    Gastric polyp    GERD (gastroesophageal reflux disease)    Hemorrhoids    HLD (hyperlipidemia)    Joint pain    left  shoulder   Kidney lesion    right   Osteopenia 05/04/2017   Postmenopausal    Sprain of anterior talofibular ligament of left ankle 10/27/2020    Past Surgical History:  Procedure Laterality Date   ENDOMETRIAL ABLATION  2007   Novasure   TUBAL LIGATION  1983    Family History  Problem Relation Age of Onset   Diabetes Mother    Heart disease Mother    Breast cancer Mother 64   Stroke Father    Diabetes Father    Heart disease Father        CABG, AVR, A-Fib   Hepatitis Brother    Alcohol abuse Brother    Raynaud syndrome Sister    Healthy Daughter    Depression Son    Anxiety disorder Son    Healthy Son     Social History   Socioeconomic History   Marital status: Married    Spouse name: Not on file   Number of children: 2   Years of education: Not on file   Highest education level: Bachelor's degree (e.g., BA, AB, BS)  Occupational History   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    Employer: [employer]  Tobacco Use   Smoking status: Former    Current packs/day: 0.00    Average packs/day: 0.3 packs/day for 4.0 years (1.0 ttl pk-yrs)    Types: Cigarettes    Start date: 07/04/1981  Quit date: 07/04/1985    Years since quitting: 38.8   Smokeless tobacco: Never  Vaping Use   Vaping status: Never Used  Substance and Sexual Activity   Alcohol use: Yes    Alcohol/week: 0.0 standard drinks of alcohol    Comment: 4-5 times a week   Drug use: No   Sexual activity: Not on file  Other Topics Concern   Not on file  Social History Narrative   Regular exercise: 30 minutes 5 days a week   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      Social Drivers of Corporate investment banker Strain: Low Risk  (04/10/2024)   Overall Financial Resource Strain (CARDIA)    Difficulty of Paying Living Expenses: Not hard at all  Food  Insecurity: No Food Insecurity (04/10/2024)   Hunger Vital Sign    Worried About Running Out of Food in the Last Year: Never true    Ran Out of Food in the Last Year: Never true  Transportation Needs: No Transportation Needs (04/10/2024)   PRAPARE - Administrator, Civil Service (Medical): No    Lack of Transportation (Non-Medical): No  Physical Activity: Inactive (04/10/2024)   Exercise Vital Sign    Days of Exercise per Week: 0 days    Minutes of Exercise per Session: Not on file  Stress: Stress Concern Present (04/10/2024)   [employer] of Occupational Health - Occupational Stress Questionnaire    Feeling of Stress: Very much  Social Connections: Moderately Isolated (04/10/2024)   Social Connection and Isolation Panel    Frequency of Communication with Friends and Family: More than three times a week    Frequency of Social Gatherings with Friends and Family: Patient declined    Attends Religious Services: Patient declined    Database administrator or Organizations: No    Attends Engineer, structural: Not on file    Marital Status: Married  Catering manager Violence: Not on file    Outpatient Medications Prior to Visit  Medication Sig Dispense Refill   ASPIRIN  LOW DOSE 81 MG tablet TAKE 1 TABLET BY MOUTH DAILY ** SWALLOW TABLET WHOLE** 90 tablet 3   atorvastatin  (LIPITOR) 80 MG tablet Take 1 tablet (80 mg total) by mouth daily. 90 tablet 3   buPROPion  (WELLBUTRIN  XL) 300 MG 24 hr tablet Take 1 tablet (300 mg total) by mouth daily. 90 tablet 1   ESTRACE VAGINAL 0.1 MG/GM vaginal cream Place 1 application vaginally at bedtime.  1   estradiol (ESTRACE) 0.5 MG tablet Take 0.5 mg by mouth daily.     ezetimibe  (ZETIA ) 10 MG tablet Take 1 tablet (10 mg total) by mouth daily. 90 tablet 3   medroxyPROGESTERone  (PROVERA ) 2.5 MG tablet Take 1 tablet (2.5 mg total) by mouth daily.     Multiple Vitamins-Minerals (MULTIVITAMIN WITH MINERALS) tablet 1 tablet.      ranolazine  (RANEXA ) 500 MG 12 hr tablet Take 1 tablet (500 mg total) by mouth 2 (two) times daily. 180 tablet 3   Semaglutide -Weight Management (WEGOVY ) 1.7 MG/0.75ML SOAJ Inject 1.7 mg into the skin once a week. 3 mL 6   zolpidem  (AMBIEN ) 10 MG tablet Take 5 mg by mouth at bedtime as needed for sleep.     cyclobenzaprine  (FLEXERIL ) 10 MG tablet TAKE 1 TABLET BY MOUTH TWO TIMES  A DAY AS NEEDED FOR MUSCLE SPASMS 180 tablet 0   omeprazole  (PRILOSEC) 40 MG capsule Take 1 capsule (40 mg total) by mouth daily. 90 capsule 1   No facility-administered medications prior to visit.    Allergies  Allergen Reactions   Hydrocodone -Acetaminophen      made her skin crawl   Omni-Pac     vomitting   Voltaren  [Diclofenac ]     diarrhea    ROS See HPI    Objective:    Physical Exam   BP 107/72 (BP Location: Right Arm, Patient Position: Sitting, Cuff Size: Small)   Pulse 71   Temp 97.6 F (36.4 C) (Oral)   Resp 16   Ht 5' 2 (1.575 m)   Wt 136 lb 3.2 oz (61.8 kg)   LMP 07/04/2004   SpO2 100%   BMI 24.91 kg/m  Wt Readings from Last 3 Encounters:  04/17/24 136 lb 3.2 oz (61.8 kg)  02/16/24 138 lb 6.4 oz (62.8 kg)  11/28/23 139 lb (63 kg)   Physical Exam  Constitutional: She is oriented to person, place, and time. She appears well-developed and well-nourished. No distress.  HENT:  Head: Normocephalic and atraumatic.  Right Ear: Tympanic membrane and ear canal normal.  Left Ear: Tympanic membrane and ear canal normal.  Mouth/Throat: Oropharynx is clear and moist.  Eyes: Pupils are equal, round, and reactive to light. No scleral icterus.  Neck: Normal range of motion. No thyromegaly present.  Cardiovascular: Normal rate and regular rhythm.   No murmur heard. Pulmonary/Chest: Effort normal and breath sounds normal. No respiratory distress. He has no wheezes. She has no rales. She exhibits no tenderness.  Abdominal: Soft. Bowel sounds are normal. She exhibits no distension and no mass.  There is no tenderness. There is no rebound and no guarding.  Musculoskeletal: She exhibits no edema. Intermittent hiccups through out visit Lymphadenopathy:    She has no cervical adenopathy.  Neurological: She is alert and oriented to person, place, and time. She has normal patellar reflexes. She exhibits normal muscle tone. Coordination normal.  Skin: Skin is warm and dry.  Psychiatric: She has a normal mood and affect. Her behavior is normal. Judgment and thought content normal.  Breast/Pelvic: deferred         Assessment & Plan:       Assessment & Plan:   Problem List Items Addressed This Visit       Unprioritized   RESOLVED: Primary hypertension   Not on antihypertensive. BP normal.      Preventative health care - Primary    Routine adult wellness visit. Blood pressure well-controlled at 107/72 mmHg. No current medication for hypertension. - Perform physical examination. - Review current medications. - Discuss general health and wellness. - Administer flu vaccine. - Administer Prevnar vaccine. - Obtain copy of last mammogram from GYN -Colo/Pap up to date - Check coverage for bone density test and schedule if covered.      Relevant Orders   Vitamin D (25 hydroxy)   Osteopenia   She will check coverage for dexa and let me know.       OSA (obstructive sleep apnea)   Has a machine and plans to restart.        Mixed hyperlipidemia   Lab Results  Component Value Date   CHOL 221 (H) 11/29/2023   HDL 60.10 11/29/2023   LDLCALC 115 (H) 11/29/2023   LDLDIRECT 203.0 11/19/2021   TRIG 230.0 (H) 11/29/2023   CHOLHDL 4 11/29/2023  On lipitor and zetia . Update lipids today.       Relevant Orders   Lipid Profile   Comp Met (CMET)   Hiccups   Intractable hiccups.  Trial of baclofen prn.       Relevant Medications   Baclofen 5 MG TABS   GERD (gastroesophageal reflux disease)   Uncontrolled on omeprazole .  Will d/c and change to pantoprazole .        Relevant Medications   pantoprazole  (PROTONIX ) 40 MG tablet   Depression   Seems more depressed, more irritable today. She is currently on wellbutrin  300mg  xl daily. Will add lexapro 10 mg.       Relevant Medications   escitalopram (LEXAPRO) 10 MG tablet   Atypical chest pain   EKG tracing is personally reviewed.  EKG notes NSR.  No acute changes.   I suspect that her discomfort is gerd related.       Relevant Orders   EKG 12-Lead (Completed)   Other Visit Diagnoses       Needs flu shot       Relevant Orders   Flu vaccine trivalent PF, 6mos and older(Flulaval,Afluria,Fluarix,Fluzone)     Need for pneumococcal 20-valent conjugate vaccination       Relevant Orders   Pneumococcal conjugate vaccine 20-valent (Prevnar 20)       I have discontinued Devere Pierce cyclobenzaprine  and omeprazole . I am also having her start on pantoprazole , escitalopram, and Baclofen. Additionally, I am having her maintain her estradiol, multivitamin with minerals, ESTRACE VAGINAL, medroxyPROGESTERone , atorvastatin , ranolazine , Aspirin  Low Dose, Wegovy , buPROPion , zolpidem , and ezetimibe .  Meds ordered this encounter  Medications   pantoprazole  (PROTONIX ) 40 MG tablet    Sig: Take 1 tablet (40 mg total) by mouth daily.    Dispense:  90 tablet    Refill:  1    Supervising Provider:   DOMENICA BLACKBIRD A [[id number]]   escitalopram (LEXAPRO) 10 MG tablet    Sig: Take 1 tablet (10 mg total) by mouth daily.    Dispense:  90 tablet    Refill:  0    Supervising Provider:   DOMENICA BLACKBIRD A [[id number]]   Baclofen 5 MG TABS    Sig: Take 1 tablet (5 mg total) by mouth 3 (three) times daily as needed (hiccups).    Dispense:  90 tablet    Refill:  2    Supervising Provider:   DOMENICA BLACKBIRD A [[id number]]

## 2024-04-17 NOTE — Assessment & Plan Note (Signed)
 Intractable hiccups.  Trial of baclofen prn.

## 2024-04-17 NOTE — Assessment & Plan Note (Signed)
 EKG tracing is personally reviewed.  EKG notes NSR.  No acute changes.   I suspect that her discomfort is gerd related.

## 2024-04-17 NOTE — Assessment & Plan Note (Signed)
 Seems more depressed, more irritable today. She is currently on wellbutrin  300mg  xl daily. Will add lexapro 10 mg.

## 2024-04-18 ENCOUNTER — Encounter: Payer: Self-pay | Admitting: Family

## 2024-04-18 ENCOUNTER — Ambulatory Visit: Payer: Self-pay | Admitting: Family

## 2024-04-18 DIAGNOSIS — Z780 Asymptomatic menopausal state: Secondary | ICD-10-CM

## 2024-04-18 DIAGNOSIS — E559 Vitamin D deficiency, unspecified: Secondary | ICD-10-CM

## 2024-04-18 MED ORDER — VITAMIN D (ERGOCALCIFEROL) 1.25 MG (50000 UNIT) PO CAPS: 50000.0000 [IU] | ORAL_CAPSULE | ORAL | 0 refills | Status: DC

## 2024-04-18 NOTE — Telephone Encounter (Signed)
 Cholesterol looks great- continue lipitor and zetia .  Vitamin D level is low.  Advise patient to begin vit D 50000 units once weekly for 12 weeks, then repeat vit D level (dx Vit D deficiency).

## 2024-04-23 NOTE — Telephone Encounter (Signed)
 Electronic request sent

## 2024-04-26 ENCOUNTER — Encounter: Payer: Self-pay | Admitting: Cardiology

## 2024-05-09 ENCOUNTER — Other Ambulatory Visit (HOSPITAL_COMMUNITY): Payer: Self-pay

## 2024-05-20 ENCOUNTER — Ambulatory Visit (HOSPITAL_BASED_OUTPATIENT_CLINIC_OR_DEPARTMENT_OTHER)
Admission: RE | Admit: 2024-05-20 | Discharge: 2024-05-20 | Disposition: A | Discharge status: Home / Self Care | Source: Ambulatory Visit | Attending: Family | Admitting: Family

## 2024-05-20 DIAGNOSIS — Z780 Asymptomatic menopausal state: Secondary | ICD-10-CM | POA: Diagnosis present

## 2024-05-22 ENCOUNTER — Encounter: Payer: Self-pay | Admitting: Family

## 2024-05-22 ENCOUNTER — Other Ambulatory Visit: Payer: Self-pay | Admitting: Family

## 2024-05-22 ENCOUNTER — Ambulatory Visit: Payer: Self-pay | Admitting: Family

## 2024-05-22 DIAGNOSIS — M8589 Other specified disorders of bone density and structure, multiple sites: Secondary | ICD-10-CM

## 2024-05-22 MED ORDER — CALCIUM CARB-CHOLECALCIFEROL 600-20 MG-MCG PO TABS: 1.0000 | ORAL_TABLET | Freq: Two times a day (BID) | ORAL | Status: AC

## 2024-05-27 ENCOUNTER — Other Ambulatory Visit: Payer: Self-pay | Admitting: Family

## 2024-05-27 DIAGNOSIS — F32A Depression, unspecified: Secondary | ICD-10-CM

## 2024-05-27 DIAGNOSIS — R059 Cough, unspecified: Secondary | ICD-10-CM

## 2024-06-12 ENCOUNTER — Other Ambulatory Visit: Payer: Self-pay | Admitting: Cardiology

## 2024-06-12 ENCOUNTER — Other Ambulatory Visit: Payer: Self-pay

## 2024-06-12 ENCOUNTER — Other Ambulatory Visit (HOSPITAL_BASED_OUTPATIENT_CLINIC_OR_DEPARTMENT_OTHER): Payer: Self-pay

## 2024-06-12 MED ORDER — WEGOVY 1.7 MG/0.75ML ~~LOC~~ SOAJ
1.7000 mg | SUBCUTANEOUS | 6 refills | Status: AC
  Filled 2024-06-12: qty 3, 28d supply, fill #0
  Filled 2024-07-12: qty 3, 28d supply, fill #1

## 2024-07-01 ENCOUNTER — Other Ambulatory Visit: Payer: Self-pay | Admitting: Cardiology

## 2024-07-01 DIAGNOSIS — Z79899 Other long term (current) drug therapy: Secondary | ICD-10-CM

## 2024-07-03 ENCOUNTER — Ambulatory Visit: Admitting: Student

## 2024-07-03 ENCOUNTER — Encounter: Payer: Self-pay | Admitting: Student

## 2024-07-03 ENCOUNTER — Encounter

## 2024-07-03 VITALS — BP 147/84 | HR 71 | Ht 62.0 in | Wt 141.2 lb

## 2024-07-03 DIAGNOSIS — R058 Other specified cough: Secondary | ICD-10-CM

## 2024-07-03 DIAGNOSIS — K219 Gastro-esophageal reflux disease without esophagitis: Secondary | ICD-10-CM | POA: Diagnosis not present

## 2024-07-03 DIAGNOSIS — J309 Allergic rhinitis, unspecified: Secondary | ICD-10-CM | POA: Diagnosis not present

## 2024-07-03 DIAGNOSIS — R030 Elevated blood-pressure reading, without diagnosis of hypertension: Secondary | ICD-10-CM | POA: Diagnosis not present

## 2024-07-03 MED ORDER — PROMETHAZINE-DM 6.25-15 MG/5ML PO SYRP: 5.0000 mL | ORAL_SOLUTION | Freq: Four times a day (QID) | ORAL | 0 refills | Status: AC | PRN

## 2024-07-03 MED ORDER — FLUTICASONE PROPIONATE 50 MCG/ACT NA SUSP: 2.0000 | Freq: Every day | NASAL | 6 refills | Status: AC

## 2024-07-03 MED ORDER — BENZONATATE 100 MG PO CAPS: 100.0000 mg | ORAL_CAPSULE | Freq: Three times a day (TID) | ORAL | 0 refills | Status: AC | PRN

## 2024-07-03 NOTE — Progress Notes (Signed)
 No chief complaint on file.   Haley Fuentes is a 63 year old female who presents with a lingering cough post-bronchitis.  She has had a persistent, non-productive cough for 1 month after bronchitis.  She was treated with bronchitis Z-pak and prednisone  40mg  once daily for 5 days. She states that she completed on 11/29. Reports that she is still having some cough Cough is non productive. It is worse at night and interferes with sleep. She notes a gritty sensation in her throat. +rhinorrhea. She has no wheezing or shortness of breath. She takes tesselon pearls at night, uses an albuterol  inhaler infrequently, and takes Mucinex regularly. She has Hx of  GERD she takes Protonix  daily, with improved reflux symptoms since switching from omeprazole  12 weeks ago.   Patient denies fever, chills, SOB, CP, palpitations, dyspnea, edema, HA, vision changes, N/V/D, abdominal pain, urinary symptoms, rash, weight changes, and recent illness or hospitalizations.    Past Medical History:  Diagnosis Date   Anemia    hx of iron deficient anemia   Arthritis    Blood transfusion without reported diagnosis    Depression    Gastric polyp    GERD (gastroesophageal reflux disease)    Hemorrhoids    HLD (hyperlipidemia)    Joint pain    left shoulder   Kidney lesion    right   Osteopenia 05/04/2017   Osteopenia    Postmenopausal    Sprain of anterior talofibular ligament of left ankle 10/27/2020    Objective BP (!) 147/84   Pulse 71   Ht 5' 2 (1.575 m)   Wt 141 lb 3.2 oz (64 kg)   LMP 07/04/2004   SpO2 100%   BMI 25.83 kg/m  General: Awake, alert, appears stated age HEENT: AT, [place], ears patent b/l and TM's neg, nares patent w/o discharge, pharynx pink and without exudates, MMM Neck: No masses or asymmetry Heart: RRR Lungs: CTAB, no accessory muscle use Psych: Age appropriate judgment and insight, normal mood and affect  Other cough - Plan: promethazine -dextromethorphan (PROMETHAZINE -DM) 6.25-15  MG/5ML syrup, benzonatate (TESSALON) 100 MG capsule  Elevated BP reading w/ no diagnosis of HTN  Allergic rhinitis, unspecified seasonality, unspecified trigger - Plan: fluticasone  (FLONASE ) 50 MCG/ACT nasal spray  Cough Persisting post-bronchitis cough, differentials include post-viral, allergic, GERD  - Rx-Flonase , Tessalon Perles  - Rx- promethazine  DM for moderate-severe nighttime cough, advised against use with ETOH or other sedating medications.  - Advised Afrin for up to three days if needed, cautioning rebound congestion.  Elevated blood pressure without hypertension Pt states she has caffeine prior to appointment, family stressors. - BP goal <130/80 - monitor and log blood pressures at home - check around the same time each day in a relaxed setting - Limit salt to <2000 mg/day - Follow DASH eating plan (heart healthy diet) - limit alcohol to 2 standard drinks per day for men and 1 per day for women - avoid tobacco products - get at least 2 hours of regular aerobic exercise weekly Patient aware of signs/symptoms requiring further/urgent evaluation. Labs updated today.   Allergic rhinitis Contributing to nasal congestion and postnasal drip, exacerbating cough. - Prescribed Flonase  for nasal symptoms and postnasal drip.  Gastroesophageal reflux disease GERD managed with Protonix , improved symptoms, potential cough contributor. - Continue Protonix . - Monitor for persistent cough beyond six to eight weeks for further evaluation.  Continue to push fluids, practice good hand hygiene, cover mouth when coughing. F/u prn. If starting to experience fevers, shaking, or  shortness of breath, seek immediate care. Pt voiced understanding and agreement to the plan.  Harlene LITTIE Jolly, DNP, AGNP-C 07/03/2024 10:27 AM

## 2024-07-09 ENCOUNTER — Other Ambulatory Visit: Payer: Self-pay | Admitting: Cardiology

## 2024-07-09 DIAGNOSIS — E559 Vitamin D deficiency, unspecified: Secondary | ICD-10-CM

## 2024-07-09 DIAGNOSIS — F32A Depression, unspecified: Secondary | ICD-10-CM

## 2024-07-09 NOTE — Telephone Encounter (Signed)
 Needs follow up vit D level prior to vit D refill please.

## 2024-07-12 ENCOUNTER — Ambulatory Visit

## 2024-07-12 ENCOUNTER — Ambulatory Visit (HOSPITAL_BASED_OUTPATIENT_CLINIC_OR_DEPARTMENT_OTHER): Admission: RE | Admit: 2024-07-12 | Discharge: 2024-07-12 | Disposition: A | Source: Ambulatory Visit

## 2024-07-12 ENCOUNTER — Other Ambulatory Visit (HOSPITAL_BASED_OUTPATIENT_CLINIC_OR_DEPARTMENT_OTHER): Payer: Self-pay

## 2024-07-12 ENCOUNTER — Encounter: Payer: Self-pay | Admitting: Family

## 2024-07-12 VITALS — BP 100/70 | Ht 62.0 in | Wt 141.0 lb

## 2024-07-12 DIAGNOSIS — S82839A Other fracture of upper and lower end of unspecified fibula, initial encounter for closed fracture: Secondary | ICD-10-CM

## 2024-07-12 DIAGNOSIS — M25572 Pain in left ankle and joints of left foot: Secondary | ICD-10-CM | POA: Diagnosis present

## 2024-07-12 MED ORDER — NAPROXEN 500 MG PO TABS
500.0000 mg | ORAL_TABLET | Freq: Two times a day (BID) | ORAL | 1 refills | Status: AC
  Filled 2024-07-12: qty 28, 14d supply, fill #0
  Filled 2024-07-24: qty 28, 14d supply, fill #1

## 2024-07-12 NOTE — Progress Notes (Signed)
" ° °  Subjective:    Patient ID: Haley Fuentes, female    DOB: 64 y.o., 23-Sep-1960   MRN: [id number]  Chief Complaint: Left ankle injury  History of Present Illness Patient is a very pleasant 64 year old female who owns a dog that last night while letting her dog out in a low lighting situation her deck, she twisted her ankle as part of a mechanical fall and as a result has had pain in her ankle ever since.  Some pain with bearing weight but able to walk largely unaffected.  Significant pain with certain movements of the ankle.  No treatments tried apart from rest, ice, elevation.  Review of Pertinent Imaging: None currently available in our system    Objective:   Vitals:   07/12/24 0850  BP: 100/70   Left Ankle ( compared to normal ) Inspection: Mild swelling.  Minimal bruising. Palpation: TTP + ATFL, - CFL, - PTFL, - anterior joint line, - navicular, equivocal base of 5th metatarsal, - deltoid, -  tarsal, - metatarsal, - achilles, - plantar fascia, + posterior aspect of distal fibula AROM/PROM: Full plantarflexion, dorsiflexion, eversion, inversion. Strength: 5/5 plantarflexion, 5/5 dorsiflexion, 5/5 eversion, 5/5 inversion Special tests: + talar tilt test, + anterior drawer, - proximal squeeze, - calcaneal squeeze, + [place] ankle rules,   3 view plain film radiographs obtained of the left ankle per my independent review on 07/12/2024 revealing well-preserved ankle mortise.  Mild osteophytic changes noted at the superior neck of the talus.  There appears to be a cortical disruption present in the distalmost aspect of the fibula with minimal displacement (1 mm).    Assessment & Plan:   Assessment & Plan Dannya is a very pleasant 64 year old female presenting with a distal fibular fracture after an inversion ankle injury last night.  I have provided her with a short cam walking boot given that the fracture appears borderline consistent with an avulsion injury.  We will follow-up in 3 weeks and  obtain repeat radiographs at that time.  I have also provided her with direction to mobilize the ankle whenever possible when not weightbearing.  I have also given her a prescription for naproxen .   "

## 2024-07-16 ENCOUNTER — Other Ambulatory Visit (INDEPENDENT_AMBULATORY_CARE_PROVIDER_SITE_OTHER)

## 2024-07-16 DIAGNOSIS — E559 Vitamin D deficiency, unspecified: Secondary | ICD-10-CM | POA: Diagnosis not present

## 2024-07-17 LAB — VITAMIN D 25 HYDROXY (VIT D DEFICIENCY, FRACTURES): VITD: 47.39 ng/mL (ref 30.00–100.00)

## 2024-07-18 ENCOUNTER — Other Ambulatory Visit: Payer: Self-pay | Admitting: Family

## 2024-07-18 ENCOUNTER — Ambulatory Visit: Payer: Self-pay | Admitting: Family

## 2024-07-18 NOTE — Progress Notes (Unsigned)
 Vitamin D  is improved.  Please switch from the weekly vit D to 3000 international units once daily OTC.

## 2024-07-18 NOTE — Progress Notes (Signed)
Patient notified of results and recomendations

## 2024-07-18 NOTE — Telephone Encounter (Signed)
 Vit D is improved.  Please switch from the once weekly supplement to once daily vitamin D  3000 international units OTC.

## 2024-08-02 ENCOUNTER — Other Ambulatory Visit (HOSPITAL_BASED_OUTPATIENT_CLINIC_OR_DEPARTMENT_OTHER): Payer: Self-pay

## 2024-08-02 ENCOUNTER — Ambulatory Visit (HOSPITAL_BASED_OUTPATIENT_CLINIC_OR_DEPARTMENT_OTHER): Admission: RE | Admit: 2024-08-02 | Discharge: 2024-08-02 | Disposition: A | Source: Ambulatory Visit

## 2024-08-02 ENCOUNTER — Ambulatory Visit

## 2024-08-02 VITALS — BP 120/80 | Ht 62.0 in | Wt 141.0 lb

## 2024-08-02 DIAGNOSIS — M25572 Pain in left ankle and joints of left foot: Secondary | ICD-10-CM | POA: Diagnosis present

## 2024-08-02 DIAGNOSIS — S82839A Other fracture of upper and lower end of unspecified fibula, initial encounter for closed fracture: Secondary | ICD-10-CM | POA: Diagnosis not present

## 2024-08-02 NOTE — Progress Notes (Signed)
" ° °  Subjective:    Patient ID: Haley Fuentes, female    DOB: 64 y.o., [birth year]   MRN: [id number]  Chief Complaint: Left distal fibular avulsion fracture (3-week follow-up)  History of Present Illness Patient sustained inversion ankle injury when he yielding distal fibular avulsion fracture with only 1 mm of displacement of the fracture fragment on 07/11/2024 from letting her dog out in a low light situation.  Since then she has been immobilized in a short cam fracture boot.  And given prescriptions for anti-inflammatory. She reports doing significantly better though some achiness near the posterior lateral aspect of her ankle has emerged in the past several days.     Objective:   Vitals:   08/02/24 0811  BP: 120/80   Left ankle: Minimal to no tenderness present at the distal fibula.  Scant bruising still present over the anterior lateral ankle. Negative talar tilt, reverse talar tilt, anterior drawer, Klie[place], proximal squeeze. Able to ambulate pain-free.  3 view plain film radiographs obtained of the left ankle per my independent review revealing well-preserved ankle mortise.  Tiny fracture fragment still visualized but significant improvements are seen in soft tissue swelling around the area. Assessment & Plan:   Assessment & Plan Sherrilyn has done very well in her healing of her left lateral ankle sprain with distal fibular avulsion fracture.  Plain film radiographs today demonstrate stability and evidence of interval healing with less soft tissue swelling.  Her exam is significantly improved and she is able to ambulate pain-free.  Recommend removal of fracture boot and initiate lace up ankle brace  (which patient has at home) use as needed.  Provided list of exercises/stretches patient can engage in at home to help optimize ankle strength and mobility now that she is out of the boot.  Follow-up as needed.  "

## 2024-08-08 NOTE — Telephone Encounter (Signed)
 Patient taking 3000 units of vitamin D  daily since last vit D check in January was normal
